# Patient Record
Sex: Female | Born: 1945 | Race: White | Hispanic: No | Marital: Married | Smoking: Never smoker
Health system: Southern US, Community
[De-identification: ages and names within clinical notes are randomized; demographics above are authoritative.]

## PROBLEM LIST (undated history)

## (undated) DIAGNOSIS — E6609 Other obesity due to excess calories: Secondary | ICD-10-CM

## (undated) DIAGNOSIS — H269 Unspecified cataract: Secondary | ICD-10-CM

## (undated) DIAGNOSIS — K219 Gastro-esophageal reflux disease without esophagitis: Secondary | ICD-10-CM

## (undated) DIAGNOSIS — M1990 Unspecified osteoarthritis, unspecified site: Secondary | ICD-10-CM

## (undated) DIAGNOSIS — I10 Essential (primary) hypertension: Secondary | ICD-10-CM

## (undated) DIAGNOSIS — M7989 Other specified soft tissue disorders: Secondary | ICD-10-CM

## (undated) DIAGNOSIS — R0602 Shortness of breath: Secondary | ICD-10-CM

## (undated) DIAGNOSIS — M5136 Other intervertebral disc degeneration, lumbar region: Secondary | ICD-10-CM

## (undated) DIAGNOSIS — M51369 Other intervertebral disc degeneration, lumbar region without mention of lumbar back pain or lower extremity pain: Secondary | ICD-10-CM

## (undated) DIAGNOSIS — R002 Palpitations: Secondary | ICD-10-CM

## (undated) DIAGNOSIS — E7800 Pure hypercholesterolemia, unspecified: Secondary | ICD-10-CM

## (undated) DIAGNOSIS — E781 Pure hyperglyceridemia: Secondary | ICD-10-CM

## (undated) DIAGNOSIS — E785 Hyperlipidemia, unspecified: Secondary | ICD-10-CM

## (undated) DIAGNOSIS — R12 Heartburn: Secondary | ICD-10-CM

## (undated) DIAGNOSIS — T7840XA Allergy, unspecified, initial encounter: Secondary | ICD-10-CM

## (undated) DIAGNOSIS — K5900 Constipation, unspecified: Secondary | ICD-10-CM

## (undated) DIAGNOSIS — R7303 Prediabetes: Secondary | ICD-10-CM

## (undated) HISTORY — DX: Pure hyperglyceridemia: E78.1

## (undated) HISTORY — DX: Gastro-esophageal reflux disease without esophagitis: K21.9

## (undated) HISTORY — DX: Unspecified osteoarthritis, unspecified site: M19.90

## (undated) HISTORY — DX: Constipation, unspecified: K59.00

## (undated) HISTORY — DX: Unspecified cataract: H26.9

## (undated) HISTORY — PX: TUBAL LIGATION: SHX77

## (undated) HISTORY — DX: Allergy, unspecified, initial encounter: T78.40XA

## (undated) HISTORY — DX: Other obesity due to excess calories: E66.09

## (undated) HISTORY — DX: Prediabetes: R73.03

## (undated) HISTORY — DX: Other intervertebral disc degeneration, lumbar region without mention of lumbar back pain or lower extremity pain: M51.369

## (undated) HISTORY — DX: Other intervertebral disc degeneration, lumbar region: M51.36

## (undated) HISTORY — DX: Essential (primary) hypertension: I10

## (undated) HISTORY — DX: Palpitations: R00.2

## (undated) HISTORY — PX: TONSILLECTOMY: SUR1361

## (undated) HISTORY — DX: Heartburn: R12

## (undated) HISTORY — PX: CATARACT EXTRACTION W/ INTRAOCULAR LENS IMPLANT: SHX1309

## (undated) HISTORY — PX: EYE SURGERY: SHX253

## (undated) HISTORY — DX: Other specified soft tissue disorders: M79.89

## (undated) HISTORY — DX: Hyperlipidemia, unspecified: E78.5

## (undated) HISTORY — DX: Shortness of breath: R06.02

## (undated) HISTORY — DX: Pure hypercholesterolemia, unspecified: E78.00

---

## 1998-11-19 ENCOUNTER — Encounter: Payer: Self-pay | Admitting: Cardiology

## 1998-11-19 ENCOUNTER — Encounter: Admission: RE | Admit: 1998-11-19 | Discharge: 1998-11-19 | Payer: Self-pay | Admitting: Cardiology

## 1999-01-02 ENCOUNTER — Other Ambulatory Visit: Admission: RE | Admit: 1999-01-02 | Discharge: 1999-01-02 | Payer: Self-pay | Admitting: *Deleted

## 1999-11-20 ENCOUNTER — Encounter: Admission: RE | Admit: 1999-11-20 | Discharge: 1999-11-20 | Payer: Self-pay | Admitting: *Deleted

## 2000-01-14 ENCOUNTER — Other Ambulatory Visit: Admission: RE | Admit: 2000-01-14 | Discharge: 2000-01-14 | Payer: Self-pay | Admitting: *Deleted

## 2000-03-17 ENCOUNTER — Ambulatory Visit (HOSPITAL_COMMUNITY): Admission: RE | Admit: 2000-03-17 | Discharge: 2000-03-17 | Payer: Self-pay | Admitting: Gastroenterology

## 2000-11-23 ENCOUNTER — Encounter: Admission: RE | Admit: 2000-11-23 | Discharge: 2000-11-23 | Payer: Self-pay | Admitting: *Deleted

## 2001-01-20 ENCOUNTER — Other Ambulatory Visit: Admission: RE | Admit: 2001-01-20 | Discharge: 2001-01-20 | Payer: Self-pay | Admitting: *Deleted

## 2001-11-24 ENCOUNTER — Encounter: Admission: RE | Admit: 2001-11-24 | Discharge: 2001-11-24 | Payer: Self-pay | Admitting: *Deleted

## 2002-01-25 ENCOUNTER — Other Ambulatory Visit: Admission: RE | Admit: 2002-01-25 | Discharge: 2002-01-25 | Payer: Self-pay | Admitting: *Deleted

## 2002-12-29 ENCOUNTER — Encounter: Admission: RE | Admit: 2002-12-29 | Discharge: 2002-12-29 | Payer: Self-pay | Admitting: *Deleted

## 2003-02-07 ENCOUNTER — Other Ambulatory Visit: Admission: RE | Admit: 2003-02-07 | Discharge: 2003-02-07 | Payer: Self-pay | Admitting: *Deleted

## 2004-01-11 ENCOUNTER — Encounter: Admission: RE | Admit: 2004-01-11 | Discharge: 2004-01-11 | Payer: Self-pay | Admitting: *Deleted

## 2005-02-10 ENCOUNTER — Encounter: Admission: RE | Admit: 2005-02-10 | Discharge: 2005-02-10 | Payer: Self-pay | Admitting: *Deleted

## 2006-02-12 ENCOUNTER — Encounter: Admission: RE | Admit: 2006-02-12 | Discharge: 2006-02-12 | Payer: Self-pay | Admitting: *Deleted

## 2007-02-15 ENCOUNTER — Encounter: Admission: RE | Admit: 2007-02-15 | Discharge: 2007-02-15 | Payer: Self-pay | Admitting: Obstetrics and Gynecology

## 2007-02-22 ENCOUNTER — Encounter: Admission: RE | Admit: 2007-02-22 | Discharge: 2007-02-22 | Payer: Self-pay | Admitting: Obstetrics and Gynecology

## 2007-03-23 ENCOUNTER — Encounter: Admission: RE | Admit: 2007-03-23 | Discharge: 2007-06-21 | Payer: Self-pay | Admitting: Cardiology

## 2007-08-24 ENCOUNTER — Encounter: Admission: RE | Admit: 2007-08-24 | Discharge: 2007-08-24 | Payer: Self-pay | Admitting: Cardiology

## 2008-02-29 ENCOUNTER — Encounter: Admission: RE | Admit: 2008-02-29 | Discharge: 2008-02-29 | Payer: Self-pay | Admitting: Obstetrics and Gynecology

## 2009-03-06 ENCOUNTER — Encounter: Admission: RE | Admit: 2009-03-06 | Discharge: 2009-03-06 | Payer: Self-pay | Admitting: Obstetrics and Gynecology

## 2009-09-16 ENCOUNTER — Ambulatory Visit: Payer: Self-pay | Admitting: Cardiology

## 2009-09-18 ENCOUNTER — Ambulatory Visit: Payer: Self-pay | Admitting: Cardiology

## 2010-02-16 ENCOUNTER — Encounter: Payer: Self-pay | Admitting: Obstetrics and Gynecology

## 2010-02-20 ENCOUNTER — Other Ambulatory Visit: Payer: Self-pay | Admitting: Obstetrics and Gynecology

## 2010-02-20 DIAGNOSIS — Z1239 Encounter for other screening for malignant neoplasm of breast: Secondary | ICD-10-CM

## 2010-03-12 ENCOUNTER — Ambulatory Visit
Admission: RE | Admit: 2010-03-12 | Discharge: 2010-03-12 | Disposition: A | Discharge status: Home / Self Care | Payer: BC Managed Care – PPO | Source: Ambulatory Visit | Attending: Obstetrics and Gynecology | Admitting: Obstetrics and Gynecology

## 2010-03-12 DIAGNOSIS — Z1239 Encounter for other screening for malignant neoplasm of breast: Secondary | ICD-10-CM

## 2010-03-20 ENCOUNTER — Other Ambulatory Visit (INDEPENDENT_AMBULATORY_CARE_PROVIDER_SITE_OTHER): Payer: BC Managed Care – PPO

## 2010-03-20 DIAGNOSIS — I119 Hypertensive heart disease without heart failure: Secondary | ICD-10-CM

## 2010-03-20 DIAGNOSIS — Z79899 Other long term (current) drug therapy: Secondary | ICD-10-CM

## 2010-03-20 DIAGNOSIS — E7800 Pure hypercholesterolemia, unspecified: Secondary | ICD-10-CM

## 2010-03-25 ENCOUNTER — Ambulatory Visit (INDEPENDENT_AMBULATORY_CARE_PROVIDER_SITE_OTHER): Payer: BC Managed Care – PPO | Admitting: Cardiology

## 2010-03-25 DIAGNOSIS — I119 Hypertensive heart disease without heart failure: Secondary | ICD-10-CM

## 2010-03-25 DIAGNOSIS — Z79899 Other long term (current) drug therapy: Secondary | ICD-10-CM

## 2010-03-25 DIAGNOSIS — E7800 Pure hypercholesterolemia, unspecified: Secondary | ICD-10-CM

## 2010-03-26 ENCOUNTER — Ambulatory Visit: Payer: Self-pay | Admitting: Cardiology

## 2010-03-26 ENCOUNTER — Other Ambulatory Visit: Payer: Self-pay | Admitting: Cardiology

## 2010-03-26 DIAGNOSIS — I10 Essential (primary) hypertension: Secondary | ICD-10-CM

## 2010-04-02 ENCOUNTER — Other Ambulatory Visit: Payer: BC Managed Care – PPO

## 2010-04-08 ENCOUNTER — Ambulatory Visit
Admission: RE | Admit: 2010-04-08 | Discharge: 2010-04-08 | Disposition: A | Discharge status: Home / Self Care | Payer: BC Managed Care – PPO | Source: Ambulatory Visit | Attending: Cardiology | Admitting: Cardiology

## 2010-04-08 DIAGNOSIS — I10 Essential (primary) hypertension: Secondary | ICD-10-CM

## 2010-06-13 NOTE — Procedures (Signed)
[place]. [facility]  Patient:    Lori Mooney, Lori Mooney                        MRN: [id number] Proc. Date: 03/17/00 Adm. Date:  [id number] Attending:  Rich Brave CC:         Pershing Cox, M.D.  Thomas A. Patty Sermons, M.D.   Procedure Report  PROCEDURE:  Colonoscopy.  INDICATIONS:  Screening for colon cancer in a 65 year old female.  FINDINGS:  Normal exam to the cecum.  DESCRIPTION OF PROCEDURE:  The nature, purpose, and risks of the procedure had been discussed with the patient, who provided written consent.  Sedation was fentanyl 100 mcg and Versed 10 mg IV without arrhythmias or desaturation.  Her blood pressure nadir was 88, which was just moderately below her baseline blood pressure, and for most of the rest of the procedure was in the 90s.  The Olympus adjustable-tension pediatric video colonoscope was advanced to the cecum with some looping overcome by having the patient in the supine position with external abdominal compression, then in the right lateral decubitus position, and finally back in the left lateral decubitus position.  It is felt that the patient might have an easier exam with the adult colonoscope on future procedures.  Pullback was then performed.  The cecum was identified by the appearance of the appendiceal orifice and the absence of further lumen.  The quality of the prep was excellent, and it is felt that all areas were well-seen.  This was a normal examination.  No polyps, cancer, colitis, vascular malformations, or diverticular disease were observed, and retroflexion in the rectum was normal.  There were some mild to moderate internal hemorrhoids seen during pull-out through the anal canal.  No biopsies were obtained.  The patient tolerated the procedure well.  There were no apparent complications.  IMPRESSION:  Normal screening colonoscopy.  PLAN:  Screening flexible sigmoidoscopy in five years with  consideration for full colonoscopy five years thereafter. DD:  03/17/00 TD:  03/17/00 Job: [id number]

## 2010-09-15 ENCOUNTER — Telehealth: Payer: Self-pay | Admitting: Cardiology

## 2010-09-15 DIAGNOSIS — Z139 Encounter for screening, unspecified: Secondary | ICD-10-CM

## 2010-09-15 NOTE — Telephone Encounter (Signed)
Left message

## 2010-09-15 NOTE — Telephone Encounter (Signed)
Chart in box 

## 2010-09-15 NOTE — Telephone Encounter (Signed)
Pt states would like to be seen earlier than November for her 25 M OV F/u.  Pt also has labs.  Please call pt back to see if you can get her in sooner for OV and Labs.  Pt will discuss with you she prefers Wed or Fri.

## 2010-09-16 ENCOUNTER — Encounter: Payer: Self-pay | Admitting: Cardiology

## 2010-09-17 ENCOUNTER — Encounter: Payer: Self-pay | Admitting: Cardiology

## 2010-10-03 ENCOUNTER — Other Ambulatory Visit (INDEPENDENT_AMBULATORY_CARE_PROVIDER_SITE_OTHER): Payer: BC Managed Care – PPO | Admitting: *Deleted

## 2010-10-03 DIAGNOSIS — Z139 Encounter for screening, unspecified: Secondary | ICD-10-CM

## 2010-10-03 LAB — LIPID PANEL
LDL Cholesterol: 98 mg/dL (ref 0–99)
VLDL: 31.4 mg/dL (ref 0.0–40.0)

## 2010-10-03 LAB — BASIC METABOLIC PANEL
BUN: 16 mg/dL (ref 6–23)
Creatinine, Ser: 0.9 mg/dL (ref 0.4–1.2)
Glucose, Bld: 93 mg/dL (ref 70–99)
Sodium: 141 mEq/L (ref 135–145)

## 2010-10-03 LAB — HEPATIC FUNCTION PANEL
Albumin: 4.3 g/dL (ref 3.5–5.2)
Alkaline Phosphatase: 71 U/L (ref 39–117)
Bilirubin, Direct: 0.1 mg/dL (ref 0.0–0.3)
Total Bilirubin: 0.5 mg/dL (ref 0.3–1.2)

## 2010-10-08 ENCOUNTER — Encounter: Payer: Self-pay | Admitting: Cardiology

## 2010-10-08 ENCOUNTER — Ambulatory Visit (INDEPENDENT_AMBULATORY_CARE_PROVIDER_SITE_OTHER): Payer: BC Managed Care – PPO | Admitting: Cardiology

## 2010-10-08 VITALS — BP 120/80 | HR 88 | Wt 187.0 lb

## 2010-10-08 DIAGNOSIS — E7800 Pure hypercholesterolemia, unspecified: Secondary | ICD-10-CM

## 2010-10-08 DIAGNOSIS — E669 Obesity, unspecified: Secondary | ICD-10-CM

## 2010-10-08 DIAGNOSIS — I119 Hypertensive heart disease without heart failure: Secondary | ICD-10-CM

## 2010-10-08 NOTE — Assessment & Plan Note (Signed)
Patient has not been expressing any chest pain or palpitations.  No dizziness or syncope.  No headaches.  She's not having side effects from the lisinopril HCT.  Her renal function remains normal.

## 2010-10-08 NOTE — Assessment & Plan Note (Signed)
The patient continues to have difficulty losing weight.  She has cut back on carbohydrates but probably not enough.  She also needs to increase her aerobic exercise further.  We talked about weight loss strategies today.
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
## 2010-10-08 NOTE — Assessment & Plan Note (Signed)
Patient has a long history of elevated cholesterol.  Presently she is on Lipitor 20 mg daily.  She's not having any myalgias or side effects from Lipitor.  We reviewed her recent blood work which was satisfactory.  Her triglycerides remain elevated secondary to her exogenous obesity.

## 2010-10-09 ENCOUNTER — Telehealth: Payer: Self-pay | Admitting: Cardiology

## 2010-10-09 NOTE — Telephone Encounter (Signed)
Called because she was concerned about the tumor Dr. 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 Please call back and feel free to leave a message or speak with her husband.

## 2010-12-04 ENCOUNTER — Telehealth: Payer: Self-pay | Admitting: Cardiology

## 2010-12-04 MED ORDER — ATORVASTATIN CALCIUM 20 MG PO TABS: 20.0000 mg | ORAL_TABLET | Freq: Every day | ORAL | Status: DC

## 2010-12-04 MED ORDER — LISINOPRIL-HYDROCHLOROTHIAZIDE 20-12.5 MG PO TABS: 1.0000 | ORAL_TABLET | Freq: Every day | ORAL | Status: DC

## 2010-12-04 NOTE — Telephone Encounter (Signed)
.   Requested Prescriptions   Signed Prescriptions Disp Refills  . atorvastatin (LIPITOR) 20 MG tablet 30 tablet 6    Sig: Take 1 tablet (20 mg total) by mouth daily.    Authorizing Provider: Cassell Clement    Ordering User: Lacie Scotts lisinopril-hydrochlorothiazide (PRINZIDE,ZESTORETIC) 20-12.5 MG per tablet 30 tablet 6    Sig: Take 1 tablet by mouth daily.    Authorizing Provider: Cassell Clement    Ordering User: Lacie Scotts

## 2010-12-04 NOTE — Telephone Encounter (Signed)
Pt states she called yesterday to get refill from [facility] and was not called in, pt going out of town tomorrow and will be out if she can't pick up refill in the morning before she leaves, atorvastatin 20 mg #90 and hctz 10mg /12.5 mg #90 , uses [facility]

## 2010-12-04 NOTE — Telephone Encounter (Signed)
Called into [facility]. Pt was called, no answer.

## 2011-02-16 ENCOUNTER — Other Ambulatory Visit: Payer: Self-pay | Admitting: Obstetrics & Gynecology

## 2011-02-16 DIAGNOSIS — Z1231 Encounter for screening mammogram for malignant neoplasm of breast: Secondary | ICD-10-CM

## 2011-02-24 DIAGNOSIS — D045 Carcinoma in situ of skin of trunk: Secondary | ICD-10-CM | POA: Diagnosis not present

## 2011-02-24 DIAGNOSIS — C44529 Squamous cell carcinoma of skin of other part of trunk: Secondary | ICD-10-CM | POA: Diagnosis not present

## 2011-02-24 DIAGNOSIS — C44621 Squamous cell carcinoma of skin of unspecified upper limb, including shoulder: Secondary | ICD-10-CM | POA: Diagnosis not present

## 2011-02-24 DIAGNOSIS — D239 Other benign neoplasm of skin, unspecified: Secondary | ICD-10-CM | POA: Diagnosis not present

## 2011-02-24 DIAGNOSIS — C44519 Basal cell carcinoma of skin of other part of trunk: Secondary | ICD-10-CM | POA: Diagnosis not present

## 2011-02-24 DIAGNOSIS — D0460 Carcinoma in situ of skin of unspecified upper limb, including shoulder: Secondary | ICD-10-CM | POA: Diagnosis not present

## 2011-02-24 DIAGNOSIS — C44721 Squamous cell carcinoma of skin of unspecified lower limb, including hip: Secondary | ICD-10-CM | POA: Diagnosis not present

## 2011-02-24 DIAGNOSIS — D0470 Carcinoma in situ of skin of unspecified lower limb, including hip: Secondary | ICD-10-CM | POA: Diagnosis not present

## 2011-02-24 DIAGNOSIS — L570 Actinic keratosis: Secondary | ICD-10-CM | POA: Diagnosis not present

## 2011-02-24 DIAGNOSIS — L821 Other seborrheic keratosis: Secondary | ICD-10-CM | POA: Diagnosis not present

## 2011-03-20 ENCOUNTER — Ambulatory Visit
Admission: RE | Admit: 2011-03-20 | Discharge: 2011-03-20 | Disposition: A | Discharge status: Home / Self Care | Payer: Medicare Other | Source: Ambulatory Visit | Attending: Obstetrics & Gynecology | Admitting: Obstetrics & Gynecology

## 2011-03-20 DIAGNOSIS — Z1231 Encounter for screening mammogram for malignant neoplasm of breast: Secondary | ICD-10-CM

## 2011-03-25 DIAGNOSIS — C44721 Squamous cell carcinoma of skin of unspecified lower limb, including hip: Secondary | ICD-10-CM | POA: Diagnosis not present

## 2011-03-25 DIAGNOSIS — L821 Other seborrheic keratosis: Secondary | ICD-10-CM | POA: Diagnosis not present

## 2011-03-25 DIAGNOSIS — L570 Actinic keratosis: Secondary | ICD-10-CM | POA: Diagnosis not present

## 2011-03-30 DIAGNOSIS — C44519 Basal cell carcinoma of skin of other part of trunk: Secondary | ICD-10-CM | POA: Diagnosis not present

## 2011-04-07 ENCOUNTER — Telehealth: Payer: Self-pay | Admitting: Cardiology

## 2011-04-07 DIAGNOSIS — R634 Abnormal weight loss: Secondary | ICD-10-CM

## 2011-04-07 NOTE — Telephone Encounter (Signed)
Discussed labs with patient.  She has had weight gain so will add thyroid panel to labs on Monday

## 2011-04-07 NOTE — Telephone Encounter (Signed)
New Msg: pt calling wanting to know if she is getting a complete panel done for pt lab work. Pt stated she hasn't had a complete panel done in a while and needs to get her sodium and everything checked. Please return pt call to discuss further.

## 2011-04-10 DIAGNOSIS — Z01419 Encounter for gynecological examination (general) (routine) without abnormal findings: Secondary | ICD-10-CM | POA: Diagnosis not present

## 2011-04-10 DIAGNOSIS — Z1389 Encounter for screening for other disorder: Secondary | ICD-10-CM | POA: Diagnosis not present

## 2011-04-10 DIAGNOSIS — Z124 Encounter for screening for malignant neoplasm of cervix: Secondary | ICD-10-CM | POA: Diagnosis not present

## 2011-04-13 ENCOUNTER — Other Ambulatory Visit (INDEPENDENT_AMBULATORY_CARE_PROVIDER_SITE_OTHER): Payer: Medicare Other

## 2011-04-13 ENCOUNTER — Other Ambulatory Visit: Payer: BC Managed Care – PPO

## 2011-04-13 DIAGNOSIS — I119 Hypertensive heart disease without heart failure: Secondary | ICD-10-CM

## 2011-04-13 DIAGNOSIS — E7800 Pure hypercholesterolemia, unspecified: Secondary | ICD-10-CM

## 2011-04-13 DIAGNOSIS — Z1212 Encounter for screening for malignant neoplasm of rectum: Secondary | ICD-10-CM | POA: Diagnosis not present

## 2011-04-13 DIAGNOSIS — R634 Abnormal weight loss: Secondary | ICD-10-CM | POA: Diagnosis not present

## 2011-04-13 LAB — T4, FREE: Free T4: 0.98 ng/dL (ref 0.60–1.60)

## 2011-04-13 LAB — LIPID PANEL
HDL: 63.5 mg/dL (ref >39.00)
LDL Cholesterol: 75 mg/dL (ref 0–99)
Total CHOL/HDL Ratio: 3
Triglycerides: 141 mg/dL (ref 0.0–149.0)

## 2011-04-13 LAB — HEPATIC FUNCTION PANEL
Alkaline Phosphatase: 57 U/L (ref 39–117)
Bilirubin, Direct: 0 mg/dL (ref 0.0–0.3)
Total Bilirubin: 0.6 mg/dL (ref 0.3–1.2)

## 2011-04-13 LAB — BASIC METABOLIC PANEL
Chloride: 104 mEq/L (ref 96–112)
GFR: 67.56 mL/min (ref >60.00)

## 2011-04-13 LAB — TSH: TSH: 1.45 u[IU]/mL (ref 0.35–5.50)

## 2011-04-13 NOTE — Progress Notes (Signed)
Quick Note:  Please make copy of labs for patient visit. ______ 

## 2011-04-17 ENCOUNTER — Encounter: Payer: Self-pay | Admitting: Cardiology

## 2011-04-17 ENCOUNTER — Ambulatory Visit (INDEPENDENT_AMBULATORY_CARE_PROVIDER_SITE_OTHER): Payer: Medicare Other | Admitting: Cardiology

## 2011-04-17 VITALS — BP 110/78 | HR 80 | Ht 62.0 in | Wt 190.0 lb

## 2011-04-17 DIAGNOSIS — E669 Obesity, unspecified: Secondary | ICD-10-CM | POA: Diagnosis not present

## 2011-04-17 DIAGNOSIS — E7800 Pure hypercholesterolemia, unspecified: Secondary | ICD-10-CM

## 2011-04-17 DIAGNOSIS — I119 Hypertensive heart disease without heart failure: Secondary | ICD-10-CM

## 2011-04-17 NOTE — Progress Notes (Signed)
Lori Mooney Date of Birth:  11/23/1945 [facility] [address] [place], [place]  [address] [phone]  Fax   [phone]  HPI: This pleasant 66 year old woman is seen for a scheduled followup office visit.  She has a history of hypertension and hypercholesterolemia.  Her problem with chronic exogenous obesity.  She is no longer participating in [employer].  Current Outpatient Prescriptions  Medication Sig Dispense Refill  . atorvastatin (LIPITOR) 20 MG tablet Take 1 tablet (20 mg total) by mouth daily.  30 tablet  6  . B Complex Vitamins (B COMPLEX PO) Take by mouth daily.        . Calcium Carbonate (CALCIUM 500 PO) Take by mouth 2 (two) times daily.        . Cholecalciferol (VITAMIN D PO) Take by mouth daily.        . Flaxseed, Linseed, (FLAX SEED OIL PO) Take by mouth daily.        [name] lisinopril-hydrochlorothiazide (PRINZIDE,ZESTORETIC) 20-12.5 MG per tablet Take 1 tablet by mouth daily.  30 tablet  6    No Known Allergies  Patient Active Problem List  Diagnoses  . Hypercholesterolemia  . Benign hypertensive heart disease without heart failure  . Obesity    History  Smoking status  . Never Smoker   Smokeless tobacco  . Not on file    History  Alcohol Use No    Family History  Problem Relation Age of Onset  . Hyperlipidemia Mother     Review of Systems: The patient denies any heat or cold intolerance.  No weight gain or weight loss.  The patient denies headaches or blurry vision.  There is no cough or sputum production.  The patient denies dizziness.  There is no hematuria or hematochezia.  The patient denies any muscle aches or arthritis.  The patient denies any rash.  The patient denies frequent falling or instability.  There is no history of depression or anxiety.  All other systems were reviewed and are negative.   Physical Exam: Filed Vitals:   04/17/11 1142  BP: 110/78  Pulse: 80   the general appearance reveals a  well-developed well-nourished middle-aged woman in no distress.The head and neck exam reveals pupils equal and reactive.  Extraocular movements are full.  There is no scleral icterus.  The mouth and pharynx are normal.  The neck is supple.  The carotids reveal no bruits.  The jugular venous pressure is normal.  The  thyroid is not enlarged.  There is no lymphadenopathy.  The chest is clear to percussion and auscultation.  There are no rales or rhonchi.  Expansion of the chest is symmetrical.  The precordium is quiet.  The first heart sound is normal.  The second heart sound is physiologically split.  There is no murmur gallop rub or click.  There is no abnormal lift or heave.  The abdomen is soft and nontender.  The bowel sounds are normal.  The liver and spleen are not enlarged.  There are no abdominal masses.  There are no abdominal bruits.  Extremities reveal good pedal pulses.  There is no phlebitis or edema.  Her left ankle is larger than her right secondary to a remote  There is no cyanosis or clubbing.  Strength is normal and symmetrical in all extremities.  There is no lateralizing weakness.  There are no sensory deficits.  The skin is warm and dry.  There is no rash.  Assessment / Plan: Continue same medication and work harder on low carbohydrate weight reduction diet.  Recheck in 6 months for followup office visit and fasting lab work

## 2011-04-17 NOTE — Assessment & Plan Note (Signed)
Her weight is up 3 pounds over the winter.  She is now on a [employer] rather than [employer].

## 2011-04-17 NOTE — Patient Instructions (Signed)
Your physician recommends that you continue on your current medications as directed. Please refer to the Current Medication list given to you today.  Your physician wants you to follow-up in: 6 months You will receive a reminder letter in the mail two months in advance. If you don't receive a letter, please call our office to schedule the follow-up appointment.  Continue strict diet

## 2011-04-17 NOTE — Assessment & Plan Note (Signed)
She remains on Lipitor.  She's not having any side effects from the statin therapy.  Her lipids are slightly better at this time even though her weight is up 3 pounds

## 2011-04-17 NOTE — Assessment & Plan Note (Signed)
The patient denies any chest pain or shortness of breath.  She is going to a low impact aerobic class at the [facility] 3 times a week for exercise.

## 2011-05-29 ENCOUNTER — Encounter: Payer: Self-pay | Admitting: Cardiology

## 2011-05-29 DIAGNOSIS — Z86010 Personal history of colonic polyps: Secondary | ICD-10-CM | POA: Diagnosis not present

## 2011-05-29 DIAGNOSIS — R198 Other specified symptoms and signs involving the digestive system and abdomen: Secondary | ICD-10-CM | POA: Diagnosis not present

## 2011-05-29 DIAGNOSIS — Z09 Encounter for follow-up examination after completed treatment for conditions other than malignant neoplasm: Secondary | ICD-10-CM | POA: Diagnosis not present

## 2011-10-30 DIAGNOSIS — L909 Atrophic disorder of skin, unspecified: Secondary | ICD-10-CM | POA: Diagnosis not present

## 2011-10-30 DIAGNOSIS — L919 Hypertrophic disorder of the skin, unspecified: Secondary | ICD-10-CM | POA: Diagnosis not present

## 2011-10-30 DIAGNOSIS — L821 Other seborrheic keratosis: Secondary | ICD-10-CM | POA: Diagnosis not present

## 2011-10-30 DIAGNOSIS — Z85828 Personal history of other malignant neoplasm of skin: Secondary | ICD-10-CM | POA: Diagnosis not present

## 2011-11-07 DIAGNOSIS — Z23 Encounter for immunization: Secondary | ICD-10-CM | POA: Diagnosis not present

## 2011-11-13 DIAGNOSIS — H43399 Other vitreous opacities, unspecified eye: Secondary | ICD-10-CM | POA: Diagnosis not present

## 2011-11-13 DIAGNOSIS — H2510 Age-related nuclear cataract, unspecified eye: Secondary | ICD-10-CM | POA: Diagnosis not present

## 2011-11-13 DIAGNOSIS — Z961 Presence of intraocular lens: Secondary | ICD-10-CM | POA: Diagnosis not present

## 2011-12-10 ENCOUNTER — Telehealth: Payer: Self-pay | Admitting: Cardiology

## 2011-12-10 NOTE — Telephone Encounter (Signed)
Left message to call back  

## 2011-12-10 NOTE — Telephone Encounter (Signed)
New problem:  Test results.  

## 2011-12-14 DIAGNOSIS — R300 Dysuria: Secondary | ICD-10-CM | POA: Diagnosis not present

## 2011-12-14 DIAGNOSIS — R350 Frequency of micturition: Secondary | ICD-10-CM | POA: Diagnosis not present

## 2011-12-18 NOTE — Telephone Encounter (Signed)
Left another message to call if she needed something, not sure what she was calling about

## 2012-02-03 ENCOUNTER — Ambulatory Visit: Payer: Self-pay | Admitting: Cardiology

## 2012-02-22 ENCOUNTER — Other Ambulatory Visit: Payer: Self-pay | Admitting: *Deleted

## 2012-02-22 DIAGNOSIS — I119 Hypertensive heart disease without heart failure: Secondary | ICD-10-CM

## 2012-02-22 DIAGNOSIS — E7800 Pure hypercholesterolemia, unspecified: Secondary | ICD-10-CM

## 2012-02-24 ENCOUNTER — Other Ambulatory Visit (INDEPENDENT_AMBULATORY_CARE_PROVIDER_SITE_OTHER): Payer: Medicare Other

## 2012-02-24 DIAGNOSIS — E7800 Pure hypercholesterolemia, unspecified: Secondary | ICD-10-CM | POA: Diagnosis not present

## 2012-02-24 DIAGNOSIS — I119 Hypertensive heart disease without heart failure: Secondary | ICD-10-CM

## 2012-02-24 LAB — HEPATIC FUNCTION PANEL
ALT: 23 U/L (ref 0–35)
AST: 21 U/L (ref 0–37)
Albumin: 4.1 g/dL (ref 3.5–5.2)
Alkaline Phosphatase: 55 U/L (ref 39–117)
Total Bilirubin: 0.7 mg/dL (ref 0.3–1.2)

## 2012-02-24 LAB — LIPID PANEL
Cholesterol: 166 mg/dL (ref 0–200)
HDL: 55.2 mg/dL (ref >39.00)
Triglycerides: 139 mg/dL (ref 0.0–149.0)

## 2012-02-24 LAB — BASIC METABOLIC PANEL
Calcium: 9.6 mg/dL (ref 8.4–10.5)
Creatinine, Ser: 0.9 mg/dL (ref 0.4–1.2)
GFR: 64.05 mL/min (ref >60.00)
Sodium: 138 mEq/L (ref 135–145)

## 2012-02-24 NOTE — Progress Notes (Signed)
Quick Note:  Please make copy of labs for patient visit. ______ 

## 2012-03-02 ENCOUNTER — Encounter: Payer: Self-pay | Admitting: Cardiology

## 2012-03-02 ENCOUNTER — Other Ambulatory Visit: Payer: Self-pay | Admitting: Obstetrics & Gynecology

## 2012-03-02 ENCOUNTER — Ambulatory Visit (INDEPENDENT_AMBULATORY_CARE_PROVIDER_SITE_OTHER): Payer: Medicare Other | Admitting: Cardiology

## 2012-03-02 VITALS — BP 118/88 | HR 89 | Ht 63.0 in | Wt 195.0 lb

## 2012-03-02 DIAGNOSIS — Z1231 Encounter for screening mammogram for malignant neoplasm of breast: Secondary | ICD-10-CM

## 2012-03-02 DIAGNOSIS — I119 Hypertensive heart disease without heart failure: Secondary | ICD-10-CM

## 2012-03-02 DIAGNOSIS — E7800 Pure hypercholesterolemia, unspecified: Secondary | ICD-10-CM | POA: Diagnosis not present

## 2012-03-02 DIAGNOSIS — N390 Urinary tract infection, site not specified: Secondary | ICD-10-CM | POA: Diagnosis not present

## 2012-03-02 DIAGNOSIS — R42 Dizziness and giddiness: Secondary | ICD-10-CM | POA: Diagnosis not present

## 2012-03-02 LAB — CBC WITH DIFFERENTIAL/PLATELET
Basophils Absolute: 0 10*3/uL (ref 0.0–0.1)
Eosinophils Relative: 2.7 % (ref 0.0–5.0)
HCT: 43.5 % (ref 36.0–46.0)
Lymphs Abs: 2.5 10*3/uL (ref 0.7–4.0)
MCV: 86.8 fl (ref 78.0–100.0)
Monocytes Absolute: 0.4 10*3/uL (ref 0.1–1.0)
Neutrophils Relative %: 52.2 % (ref 43.0–77.0)
Platelets: 313 10*3/uL (ref 150.0–400.0)
RDW: 13.4 % (ref 11.5–14.6)
WBC: 6.7 10*3/uL (ref 4.5–10.5)

## 2012-03-02 MED ORDER — ATORVASTATIN CALCIUM 20 MG PO TABS: 20.0000 mg | ORAL_TABLET | Freq: Every day | ORAL | Status: DC

## 2012-03-02 NOTE — Patient Instructions (Addendum)
Will obtain labs today and call you with the results (cbc/urinalysis)  Try OTC Bonine  (meclizine) 25 mg ever 6 hours as needed for dizziness and OTC Mucinex plain 600 mg every 12 hours as needed for cough/congestion.   Your physician wants you to follow-up in: 6 months with fasting labs (lp/bmet/hfp)  You will receive a reminder letter in the mail two months in advance. If you don't receive a letter, please call our office to schedule the follow-up appointment.

## 2012-03-02 NOTE — Progress Notes (Signed)
Lori Mooney Date of Birth:  10-24-1945 [facility] [address] [place], [place]  [address] [phone]  Fax   [phone]  HPI: This pleasant 67 year old woman is seen for a scheduled followup office visit. She has a history of hypertension and hypercholesterolemia.  She has a problem with chronic exogenous obesity. She is no longer participating in [employer].  Her weight is up 5 pounds since last visit.   Current Outpatient Prescriptions  Medication Sig Dispense Refill  . atorvastatin (LIPITOR) 20 MG tablet Take 1 tablet (20 mg total) by mouth daily.  90 tablet  3  . B Complex Vitamins (B COMPLEX PO) Take by mouth daily.        . Calcium Carbonate (CALCIUM 500 PO) Take by mouth 2 (two) times daily.        . Cholecalciferol (VITAMIN D PO) Take by mouth daily.        . Flaxseed, Linseed, (FLAX SEED OIL PO) Take by mouth daily.        [name] guaiFENesin (MUCINEX) 600 MG 12 hr tablet Take 600 mg by mouth 2 (two) times daily as needed.      [name] lisinopril-hydrochlorothiazide (PRINZIDE,ZESTORETIC) 20-12.5 MG per tablet Take 1 tablet by mouth daily.  30 tablet  6  . meclizine (ANTIVERT) 25 MG tablet Take 25 mg by mouth every 6 (six) hours as needed.        No Known Allergies  Patient Active Problem List  Diagnosis  . Hypercholesterolemia  . Benign hypertensive heart disease without heart failure  . Obesity    History  Smoking status  . Never Smoker   Smokeless tobacco  . Not on file    History  Alcohol Use No    Family History  Problem Relation Age of Onset  . Hyperlipidemia Mother     Review of Systems: The patient denies any heat or cold intolerance.  No weight gain or weight loss.  The patient denies headaches or blurry vision.  There is no cough or sputum production.  The patient denies dizziness.  There is no hematuria or hematochezia.  The patient denies any muscle aches or arthritis.  The patient denies any rash.  The patient denies  frequent falling or instability.  There is no history of depression or anxiety.  All other systems were reviewed and are negative.   Physical Exam: Filed Vitals:   03/02/12 1017  BP: 118/88  Pulse: 89   the general appearance reveals a overweight woman in no acute distress.The head and neck exam reveals pupils equal and reactive.  Extraocular movements are full.  There is no scleral icterus.  The mouth and pharynx are normal.  The neck is supple.  The carotids reveal no bruits.  The jugular venous pressure is normal.  The  thyroid is not enlarged.  There is no lymphadenopathy.  The chest is clear to percussion and auscultation.  There are no rales or rhonchi.  Expansion of the chest is symmetrical.  The precordium is quiet.  The first heart sound is normal.  The second heart sound is physiologically split.  There is no murmur gallop rub or click.  There is no abnormal lift or heave.  The abdomen is soft and nontender.  The bowel sounds are normal.  The liver and spleen are not enlarged.  There are no abdominal masses.  There are no abdominal bruits.  Extremities reveal good pedal pulses.  There is no phlebitis or edema.  There  is no cyanosis or clubbing.  Strength is normal and symmetrical in all extremities.  There is no lateralizing weakness.  There are no sensory deficits.  The skin is warm and dry.  There is no rash.      Assessment / Plan: We talked about the importance of weight loss.  We talked about adhering strictly to a low carbohydrate low starch low sugar diet.  She states that she has found that weight loss program such as Weight Watchers do not work for her.  She needs to exercise on a more regular basis also.  Continue same medication and be rechecked in 6 months for followup office visit lipid panel hepatic function panel and basal metabolic panel.

## 2012-03-02 NOTE — Assessment & Plan Note (Signed)
The patient has had some recent symptoms that make her think that she may have a quiescent urinary tract infection.  No fever or chills.  We talked about how UTIs may present with subtle symptoms in women.  We will get a urinalysis and culture on her.

## 2012-03-02 NOTE — Assessment & Plan Note (Signed)
The patient denies any chest pain or increased shortness of breath.  She has not had any syncopal episodes.  He has had some dizziness related to vertigo for the past 2 days and she will use some over-the-counter meclizine as needed.

## 2012-03-02 NOTE — Progress Notes (Signed)
Quick Note:  Please report to patient. The recent labs are stable. Continue same medication and careful diet. WBC is normal. ______

## 2012-03-02 NOTE — Assessment & Plan Note (Signed)
Patient having symptoms of mild vertigo for the past 2 days.  Examination of her ears shows normal tympanic membranes and no evidence of a middle ear infection.  She will use over-the-counter meclizine.

## 2012-03-02 NOTE — Assessment & Plan Note (Signed)
The patient is on Lipitor 20 mg daily.  Is not having myalgias or side effects.

## 2012-03-03 LAB — URINALYSIS W MICROSCOPIC + REFLEX CULTURE
Glucose, UA: NEGATIVE mg/dL
Hgb urine dipstick: NEGATIVE
Leukocytes, UA: NEGATIVE
Nitrite: NEGATIVE
Protein, ur: NEGATIVE mg/dL
Squamous Epithelial / LPF: NONE SEEN
Urobilinogen, UA: 0.2 mg/dL (ref 0.0–1.0)

## 2012-03-04 ENCOUNTER — Telehealth: Payer: Self-pay | Admitting: Cardiology

## 2012-03-04 NOTE — Telephone Encounter (Signed)
Pt rtn call to cheryl from yesterday

## 2012-03-04 NOTE — Telephone Encounter (Signed)
Patient called lab and urinalysis results given.

## 2012-03-30 ENCOUNTER — Ambulatory Visit
Admission: RE | Admit: 2012-03-30 | Discharge: 2012-03-30 | Disposition: A | Discharge status: Home / Self Care | Payer: Medicare Other | Source: Ambulatory Visit | Attending: Obstetrics & Gynecology | Admitting: Obstetrics & Gynecology

## 2012-03-30 DIAGNOSIS — Z1231 Encounter for screening mammogram for malignant neoplasm of breast: Secondary | ICD-10-CM

## 2012-05-04 DIAGNOSIS — L819 Disorder of pigmentation, unspecified: Secondary | ICD-10-CM | POA: Diagnosis not present

## 2012-05-04 DIAGNOSIS — L820 Inflamed seborrheic keratosis: Secondary | ICD-10-CM | POA: Diagnosis not present

## 2012-05-04 DIAGNOSIS — D044 Carcinoma in situ of skin of scalp and neck: Secondary | ICD-10-CM | POA: Diagnosis not present

## 2012-05-04 DIAGNOSIS — Z85828 Personal history of other malignant neoplasm of skin: Secondary | ICD-10-CM | POA: Diagnosis not present

## 2012-07-22 DIAGNOSIS — N950 Postmenopausal bleeding: Secondary | ICD-10-CM | POA: Diagnosis not present

## 2012-07-22 DIAGNOSIS — N840 Polyp of corpus uteri: Secondary | ICD-10-CM | POA: Diagnosis not present

## 2012-07-22 DIAGNOSIS — N859 Noninflammatory disorder of uterus, unspecified: Secondary | ICD-10-CM | POA: Diagnosis not present

## 2012-07-28 DIAGNOSIS — N840 Polyp of corpus uteri: Secondary | ICD-10-CM | POA: Diagnosis not present

## 2012-07-28 DIAGNOSIS — N950 Postmenopausal bleeding: Secondary | ICD-10-CM | POA: Diagnosis not present

## 2012-08-08 ENCOUNTER — Other Ambulatory Visit: Payer: Self-pay | Admitting: Obstetrics & Gynecology

## 2012-08-23 ENCOUNTER — Encounter (HOSPITAL_COMMUNITY)
Admission: RE | Admit: 2012-08-23 | Discharge: 2012-08-23 | Disposition: A | Discharge status: Home / Self Care | Payer: Medicare Other | Source: Ambulatory Visit | Attending: Obstetrics & Gynecology | Admitting: Obstetrics & Gynecology

## 2012-08-23 ENCOUNTER — Other Ambulatory Visit: Payer: Self-pay

## 2012-08-23 ENCOUNTER — Encounter (HOSPITAL_COMMUNITY): Payer: Self-pay

## 2012-08-23 DIAGNOSIS — N840 Polyp of corpus uteri: Secondary | ICD-10-CM | POA: Diagnosis not present

## 2012-08-23 DIAGNOSIS — K219 Gastro-esophageal reflux disease without esophagitis: Secondary | ICD-10-CM | POA: Diagnosis not present

## 2012-08-23 DIAGNOSIS — E669 Obesity, unspecified: Secondary | ICD-10-CM | POA: Diagnosis not present

## 2012-08-23 DIAGNOSIS — E7800 Pure hypercholesterolemia, unspecified: Secondary | ICD-10-CM | POA: Diagnosis not present

## 2012-08-23 DIAGNOSIS — I10 Essential (primary) hypertension: Secondary | ICD-10-CM | POA: Diagnosis not present

## 2012-08-23 DIAGNOSIS — Z79899 Other long term (current) drug therapy: Secondary | ICD-10-CM | POA: Diagnosis not present

## 2012-08-23 DIAGNOSIS — N854 Malposition of uterus: Secondary | ICD-10-CM | POA: Diagnosis not present

## 2012-08-23 DIAGNOSIS — N950 Postmenopausal bleeding: Secondary | ICD-10-CM | POA: Diagnosis not present

## 2012-08-23 LAB — COMPREHENSIVE METABOLIC PANEL
ALT: 24 U/L (ref 0–35)
AST: 21 U/L (ref 0–37)
Calcium: 9.8 mg/dL (ref 8.4–10.5)
Sodium: 138 mEq/L (ref 135–145)
Total Protein: 7 g/dL (ref 6.0–8.3)

## 2012-08-23 LAB — CBC
MCH: 29.7 pg (ref 26.0–34.0)
MCHC: 34.3 g/dL (ref 30.0–36.0)
Platelets: 280 10*3/uL (ref 150–400)
RBC: 5.11 MIL/uL (ref 3.87–5.11)

## 2012-08-23 NOTE — Patient Instructions (Addendum)
20 Lori Mooney  08/23/2012   Your procedure is scheduled on:  08/25/12  Enter through the Main Entrance of [facility] at 1130 AM.  Pick up the phone at the desk and dial [phone].   Call this number if you have problems the morning of surgery: [phone]   Remember:   Do not eat food:After Midnight.  Do not drink clear liquids: 4 Hours before arrival.  Take these medicines the morning of surgery with A SIP OF WATER: Blood pressure medication and  Zantac   Do not wear jewelry, make-up or nail polish.  Do not wear lotions, powders, or perfumes. You may wear deodorant.  Do not shave 48 hours prior to surgery.  Do not bring valuables to the hospital.  [facility] is not responsible                  for any belongings or valuables brought to the hospital.  Contacts, dentures or bridgework may not be worn into surgery.  Leave suitcase in the car. After surgery it may be brought to your room.  For patients admitted to the hospital, checkout time is 11:00 AM the day of                discharge.   Patients discharged the day of surgery will not be allowed to drive                   home.  Name and phone number of your driver: Husband  Special Instructions: Shower using CHG 2 nights before surgery and the night before surgery.  If you shower the day of surgery use CHG.  Use special wash - you have one bottle of CHG for all showers.  You should use approximately 1/3 of the bottle for each shower.   Please read over the following fact sheets that you were given: Surgical Site Infection Prevention

## 2012-08-25 ENCOUNTER — Encounter (HOSPITAL_COMMUNITY): Payer: Self-pay | Admitting: Anesthesiology

## 2012-08-25 ENCOUNTER — Encounter (HOSPITAL_COMMUNITY)
Admission: RE | Disposition: A | Discharge status: Home / Self Care | Payer: Self-pay | Source: Ambulatory Visit | Attending: Obstetrics & Gynecology

## 2012-08-25 ENCOUNTER — Ambulatory Visit (HOSPITAL_COMMUNITY): Payer: Medicare Other | Admitting: Anesthesiology

## 2012-08-25 ENCOUNTER — Ambulatory Visit (HOSPITAL_COMMUNITY)
Admission: RE | Admit: 2012-08-25 | Discharge: 2012-08-25 | Disposition: A | Discharge status: Home / Self Care | Payer: Medicare Other | Source: Ambulatory Visit | Attending: Obstetrics & Gynecology | Admitting: Obstetrics & Gynecology

## 2012-08-25 DIAGNOSIS — N950 Postmenopausal bleeding: Secondary | ICD-10-CM | POA: Insufficient documentation

## 2012-08-25 DIAGNOSIS — E669 Obesity, unspecified: Secondary | ICD-10-CM | POA: Insufficient documentation

## 2012-08-25 DIAGNOSIS — E7800 Pure hypercholesterolemia, unspecified: Secondary | ICD-10-CM | POA: Diagnosis not present

## 2012-08-25 DIAGNOSIS — I10 Essential (primary) hypertension: Secondary | ICD-10-CM | POA: Insufficient documentation

## 2012-08-25 DIAGNOSIS — N840 Polyp of corpus uteri: Secondary | ICD-10-CM | POA: Insufficient documentation

## 2012-08-25 DIAGNOSIS — Z79899 Other long term (current) drug therapy: Secondary | ICD-10-CM | POA: Insufficient documentation

## 2012-08-25 DIAGNOSIS — N854 Malposition of uterus: Secondary | ICD-10-CM | POA: Insufficient documentation

## 2012-08-25 DIAGNOSIS — K219 Gastro-esophageal reflux disease without esophagitis: Secondary | ICD-10-CM | POA: Insufficient documentation

## 2012-08-25 DIAGNOSIS — D250 Submucous leiomyoma of uterus: Secondary | ICD-10-CM | POA: Diagnosis not present

## 2012-08-25 HISTORY — PX: DILITATION & CURRETTAGE/HYSTROSCOPY WITH VERSAPOINT RESECTION: SHX5571

## 2012-08-25 SURGERY — DILATATION & CURETTAGE/HYSTEROSCOPY WITH VERSAPOINT RESECTION
Anesthesia: General | Site: Vagina | Wound class: Clean Contaminated

## 2012-08-25 MED ORDER — DEXAMETHASONE SODIUM PHOSPHATE 10 MG/ML IJ SOLN
INTRAMUSCULAR | Status: AC
  Filled 2012-08-25: qty 1

## 2012-08-25 MED ORDER — FENTANYL CITRATE 0.05 MG/ML IJ SOLN
INTRAMUSCULAR | Status: DC | PRN
  Administered 2012-08-25 (×2): 25 ug via INTRAVENOUS
  Administered 2012-08-25: 50 ug via INTRAVENOUS

## 2012-08-25 MED ORDER — EPHEDRINE SULFATE 50 MG/ML IJ SOLN
INTRAMUSCULAR | Status: DC | PRN
  Administered 2012-08-25: 15 mg via INTRAVENOUS

## 2012-08-25 MED ORDER — LACTATED RINGERS IV SOLN
INTRAVENOUS | Status: DC
  Administered 2012-08-25 (×2): via INTRAVENOUS

## 2012-08-25 MED ORDER — EPHEDRINE 5 MG/ML INJ
INTRAVENOUS | Status: AC
  Filled 2012-08-25: qty 10

## 2012-08-25 MED ORDER — FENTANYL CITRATE 0.05 MG/ML IJ SOLN: 25.0000 ug | INTRAMUSCULAR | Status: DC | PRN

## 2012-08-25 MED ORDER — LIDOCAINE HCL (CARDIAC) 20 MG/ML IV SOLN
INTRAVENOUS | Status: DC | PRN
  Administered 2012-08-25: 100 mg via INTRAVENOUS

## 2012-08-25 MED ORDER — LIDOCAINE HCL (CARDIAC) 20 MG/ML IV SOLN
INTRAVENOUS | Status: AC
  Filled 2012-08-25: qty 5

## 2012-08-25 MED ORDER — DEXAMETHASONE SODIUM PHOSPHATE 10 MG/ML IJ SOLN
INTRAMUSCULAR | Status: DC | PRN
  Administered 2012-08-25: 10 mg via INTRAVENOUS

## 2012-08-25 MED ORDER — SODIUM CHLORIDE 0.9 % IR SOLN
Status: DC | PRN
  Administered 2012-08-25: 3000 mL

## 2012-08-25 MED ORDER — CEFAZOLIN SODIUM-DEXTROSE 2-3 GM-% IV SOLR
INTRAVENOUS | Status: AC
  Filled 2012-08-25: qty 50

## 2012-08-25 MED ORDER — ONDANSETRON HCL 4 MG/2ML IJ SOLN
INTRAMUSCULAR | Status: AC
  Filled 2012-08-25: qty 2

## 2012-08-25 MED ORDER — PROPOFOL 10 MG/ML IV BOLUS
INTRAVENOUS | Status: DC | PRN
  Administered 2012-08-25: 200 mg via INTRAVENOUS

## 2012-08-25 MED ORDER — CHLOROPROCAINE HCL 1 % IJ SOLN
INTRAMUSCULAR | Status: AC
  Filled 2012-08-25: qty 30

## 2012-08-25 MED ORDER — MEPERIDINE HCL 25 MG/ML IJ SOLN: 6.2500 mg | INTRAMUSCULAR | Status: DC | PRN

## 2012-08-25 MED ORDER — FENTANYL CITRATE 0.05 MG/ML IJ SOLN
INTRAMUSCULAR | Status: AC
  Filled 2012-08-25: qty 2

## 2012-08-25 MED ORDER — CEFAZOLIN SODIUM-DEXTROSE 2-3 GM-% IV SOLR
2.0000 g | INTRAVENOUS | Status: AC
  Administered 2012-08-25: 2 g via INTRAVENOUS

## 2012-08-25 MED ORDER — ONDANSETRON HCL 4 MG/2ML IJ SOLN
INTRAMUSCULAR | Status: DC | PRN
  Administered 2012-08-25: 4 mg via INTRAVENOUS

## 2012-08-25 MED ORDER — MIDAZOLAM HCL 2 MG/2ML IJ SOLN
INTRAMUSCULAR | Status: AC
  Filled 2012-08-25: qty 2

## 2012-08-25 MED ORDER — CHLOROPROCAINE HCL 1 % IJ SOLN
INTRAMUSCULAR | Status: DC | PRN
  Administered 2012-08-25: 20 mL

## 2012-08-25 MED ORDER — KETOROLAC TROMETHAMINE 30 MG/ML IJ SOLN
INTRAMUSCULAR | Status: AC
  Filled 2012-08-25: qty 1

## 2012-08-25 MED ORDER — METOCLOPRAMIDE HCL 5 MG/ML IJ SOLN: 10.0000 mg | Freq: Once | INTRAMUSCULAR | Status: DC | PRN

## 2012-08-25 MED ORDER — MIDAZOLAM HCL 5 MG/5ML IJ SOLN
INTRAMUSCULAR | Status: DC | PRN
  Administered 2012-08-25: 2 mg via INTRAVENOUS

## 2012-08-25 MED ORDER — KETOROLAC TROMETHAMINE 30 MG/ML IJ SOLN
INTRAMUSCULAR | Status: DC | PRN
  Administered 2012-08-25: 30 mg via INTRAVENOUS

## 2012-08-25 MED ORDER — PROPOFOL 10 MG/ML IV EMUL
INTRAVENOUS | Status: AC
  Filled 2012-08-25: qty 20

## 2012-08-25 SURGICAL SUPPLY — 14 items
CANISTER SUCTION 2500CC (MISCELLANEOUS) ×2 IMPLANT
CATH ROBINSON RED A/P 16FR (CATHETERS) ×2 IMPLANT
CLOTH BEACON ORANGE TIMEOUT ST (SAFETY) ×2 IMPLANT
CONTAINER PREFILL 10% NBF 60ML (FORM) ×4 IMPLANT
DRESSING TELFA 8X3 (GAUZE/BANDAGES/DRESSINGS) ×2 IMPLANT
ELECTRODE RT ANGLE VERSAPOINT (CUTTING LOOP) ×1 IMPLANT
GLOVE BIO SURGEON STRL SZ 6.5 (GLOVE) ×2 IMPLANT
GLOVE BIOGEL PI IND STRL 7.0 (GLOVE) ×2 IMPLANT
GLOVE BIOGEL PI INDICATOR 7.0 (GLOVE) ×2
GOWN STRL REIN XL XLG (GOWN DISPOSABLE) ×2 IMPLANT
PACK HYSTEROSCOPY LF (CUSTOM PROCEDURE TRAY) ×2 IMPLANT
PAD OB MATERNITY 4.3X12.25 (PERSONAL CARE ITEMS) ×2 IMPLANT
TOWEL OR 17X24 6PK STRL BLUE (TOWEL DISPOSABLE) ×4 IMPLANT
WATER STERILE IRR 1000ML POUR (IV SOLUTION) ×2 IMPLANT

## 2012-08-25 NOTE — Transfer of Care (Signed)
Immediate Anesthesia Transfer of Care Note  Patient: Lori Mooney  Procedure(s) Performed: Procedure(s): DILATATION & CURETTAGE/HYSTEROSCOPY WITH VERSAPOINT RESECTION (N/A)  Patient Location: PACU  Anesthesia Type:General  Level of Consciousness: awake, alert  and oriented  Airway & Oxygen Therapy: Patient Spontanous Breathing and Patient connected to nasal cannula oxygen  Post-op Assessment: Report given to PACU RN  Post vital signs: Reviewed  Complications: No apparent anesthesia complications

## 2012-08-25 NOTE — Preoperative (Signed)
Beta Blockers   Reason not to administer Beta Blockers:Not Applicable 

## 2012-08-25 NOTE — Op Note (Signed)
08/25/2012  1:54 PM  PATIENT:  Lori Mooney  66 y.o. 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 She is prepped with Hibiclens on the suprapubic, vulvar and vaginal areas. The bladder is catheterized. The patient is draped as usual. The vaginal exam reveals an anteverted uterus, normal volume, no adnexal mass. The speculum is inserted in the vagina.  The anterior lip of the cervix is grasped with a tenaculum.  A paracervical block is done with Nesacaine 1% a total of 20 cc at 4 and 8:00.  The hysterometry is 8 cm. Dilation of the cervix with Hegar dilators up to #31 without difficulty. We inserted the operative hysteroscope with the versa point loupe. Inspection of the intrauterine cavity revealing a large posterior mass of about 3 cm in diameter.  It's contour was slightly irregular with mild vascularity.  Pictures are taken. Both ostia all well seen. The rest of the cavity is normal in appearance.  Resection of the mass with the versa point loupe. 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  The lesion is completely resected. All pieces are removed and sent to pathology. Pictures are taken after resection. Hemostasis is adequate at all levels. We therefore removed the hysteroscope with the versa point. We proceed with the systematic curettage of the intrauterine cavity on all surfaces with a sharp curet. The cavity appears atrophic and very little tissue is removed. That specimen is sent together with the resection specimen.  The tenaculum was removed from the anterior lip of the  cervix. Hemostasis is adequate at that level. The speculum is removed. The patient is brought to recovery room in good and stable status.  ESTIMATED BLOOD LOSS:  5 cc   Intake/Output Summary (Last 24 hours) at 08/25/12 1354 Last data filed at 08/25/12 1342  Gross per 24 hour  Intake   1000 ml  Output     55 ml  Net    945 ml     BLOOD ADMINISTERED:none   LOCAL MEDICATIONS USED:  Nesacaine 1%, Amount: 20 ml  SPECIMEN:  Source of Specimen:  Resection and endometrial curettings  DISPOSITION OF SPECIMEN:  PATHOLOGY  COUNTS:  YES  PLAN OF CARE: Transfer to PACU    Genia Del MD  08/25/2012 at 1:55 pm

## 2012-08-25 NOTE — Discharge Summary (Signed)
  Physician Discharge Summary  Patient ID: Lori Mooney MRN: [id number] DOB/AGE: 1945-04-22 67 y.o.  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 *   Discharged Condition: good  Hospital Course: good  Consults: None  Treatments: surgery: Hysteroscopy with Versapoint resection, D+C  Disposition: Final discharge disposition not confirmed   Future Appointments Provider Department Dept Phone   09/06/2012 7:30 AM [facility] Main Office [place]) [phone]   09/12/2012 2:00 PM Cassell Clement, MD [facility] Main Office [place]) [phone]       Medication List         atorvastatin 20 MG tablet  Commonly known as:  LIPITOR  Take 1 tablet (20 mg total) by mouth daily.     Fish Oil 1200 MG Caps  Take 1,200 mg by mouth daily.     ibuprofen 200 MG tablet  Commonly known as:  ADVIL,MOTRIN  Take 400 mg by mouth every 6 (six) hours as needed for pain.     lisinopril-hydrochlorothiazide 20-12.5 MG per tablet  Commonly known as:  PRINZIDE,ZESTORETIC  Take 1 tablet by mouth daily.     Magnesium 250 MG Tabs  Take 250-500 mg by mouth at bedtime. 250 mg for sleep and 500 mg for constipation     VITAMIN B12 PO  Take 1 tablet by mouth daily.     Vitamin D-3 1000 UNITS Caps  Take 1,000 Units by mouth daily.     ZANTAC PO  Take 1 tablet by mouth daily as needed. For heartburn           Follow-up Information   Follow up with Althia Egolf,MARIE-LYNE, MD In 3 weeks.   Contact information:   [address] [place] [address] [phone]       Signed: Genia Del, MD 08/25/2012, 2:05 PM

## 2012-08-25 NOTE — H&P (Signed)
Lori Mooney is an 67 y.o. female   RP:  PMB IU lesion for [facility] resection, D+C  Pertinent Gynecological History: Menses: post-menopausal Bleeding: post menopausal bleeding Contraception: none Blood transfusions: none Sexually transmitted diseases: no past history Last mammogram: normal Last pap: normal     Menstrual History:  No LMP recorded.    Past Medical History  Diagnosis Date  . Hypertension   . Hyperlipidemia     hypercholesterolemia  . Exogenous obesity   . 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 Tubal ligation    . Tonsillectomy    . Cataract extraction w/ intraocular lens implant Left     Family History  Problem Relation Age of Onset  . Hyperlipidemia Mother     Social History:  reports that she has never smoked. She does not have any smokeless tobacco history on file. She reports that she does not drink alcohol or use illicit drugs.  Allergies:  Allergies  Allergen Reactions  . Shellfish Allergy Hives  . Strawberry Hives    Prescriptions prior to admission  Medication Sig Dispense Refill  . Cholecalciferol (VITAMIN D-3) 1000 UNITS CAPS Take 1,000 Units by mouth daily.      . Cyanocobalamin (VITAMIN B12 PO) Take 1 tablet by mouth daily.      [name] ibuprofen (ADVIL,MOTRIN) 200 MG tablet Take 400 mg by mouth every 6 (six) hours as needed for pain.      [name] lisinopril-hydrochlorothiazide (PRINZIDE,ZESTORETIC) 20-12.5 MG per tablet Take 1 tablet by mouth daily.  30 tablet  6  . Magnesium 250 MG TABS Take 250-500 mg by mouth at bedtime. 250 mg for sleep and 500 mg for constipation      . Omega-3 Fatty Acids (FISH OIL) 1200 MG CAPS Take 1,200 mg by mouth daily.      . Ranitidine HCl (ZANTAC PO) Take 1 tablet by mouth daily as needed. For heartburn      . atorvastatin (LIPITOR) 20 MG tablet Take 1 tablet (20 mg total) by mouth daily.  90 tablet  3     Blood pressure 130/76, pulse 88,  temperature 97.7 F (36.5 C), resp. rate 18, SpO2 100.00%.  Sonohysto  IU lesion.  No results found for this or any previous visit (from the past 24 hour(s)).  No results found.  Assessment/Plan: PMB with endometrial polyp.  [facility]SC Versapoint resection, D+C.  Surgery/risks reviewed.  Lilas Diefendorf,MARIE-LYNE 08/25/2012, 12:52 PM

## 2012-08-25 NOTE — Anesthesia Preprocedure Evaluation (Signed)
Anesthesia Evaluation  Patient identified by MRN, date of birth, ID band Patient awake    Reviewed: Allergy & Precautions, H&P , NPO status , Patient's Chart, lab work & pertinent test results  Airway Mallampati: II TM Distance: >3 FB Neck ROM: Full    Dental no notable dental hx. (+) Teeth Intact   Pulmonary neg pulmonary ROS,  breath sounds clear to auscultation  Pulmonary exam normal       Cardiovascular hypertension, Pt. on medications negative cardio ROS  Rhythm:Regular Rate:Normal     Neuro/Psych negative neurological ROS  negative psych ROS   GI/Hepatic negative GI ROS, Neg liver ROS, GERD-  Medicated and Controlled,  Endo/Other  negative endocrine ROS  Renal/GU negative Renal ROS  negative genitourinary   Musculoskeletal negative musculoskeletal ROS (+)   Abdominal Normal abdominal exam  (+) + obese,   Peds  Hematology negative hematology ROS (+)   Anesthesia Other Findings   Reproductive/Obstetrics Endometrial polyp                           Anesthesia Physical Anesthesia Plan  ASA: II  Anesthesia Plan: General   Post-op Pain Management:    Induction: Intravenous  Airway Management Planned: LMA  Additional Equipment:   Intra-op Plan:   Post-operative Plan: Extubation in OR  Informed Consent: I have reviewed the patients History and Physical, chart, labs and discussed the procedure including the risks, benefits and alternatives for the proposed anesthesia with the patient or authorized representative who has indicated his/her understanding and acceptance.   Dental advisory given  Plan Discussed with: CRNA, Anesthesiologist and Surgeon  Anesthesia Plan Comments:         Anesthesia Quick Evaluation

## 2012-08-26 ENCOUNTER — Encounter (HOSPITAL_COMMUNITY): Payer: Self-pay | Admitting: Obstetrics & Gynecology

## 2012-08-26 DIAGNOSIS — N979 Female infertility, unspecified: Secondary | ICD-10-CM | POA: Diagnosis not present

## 2012-08-29 NOTE — Anesthesia Postprocedure Evaluation (Signed)
  Anesthesia Post-op Note  Patient: Lori Mooney  Procedure(s) Performed: Procedure(s): DILATATION & CURETTAGE/HYSTEROSCOPY WITH VERSAPOINT RESECTION (N/A) Patient is awake and responsive. Pain and nausea are reasonably well controlled. Vital signs are stable and clinically acceptable. Oxygen saturation is clinically acceptable. There are no apparent anesthetic complications at this time. Patient is ready for discharge.

## 2012-08-31 ENCOUNTER — Other Ambulatory Visit: Payer: Self-pay

## 2012-09-06 ENCOUNTER — Other Ambulatory Visit (INDEPENDENT_AMBULATORY_CARE_PROVIDER_SITE_OTHER): Payer: Medicare Other

## 2012-09-06 DIAGNOSIS — E7800 Pure hypercholesterolemia, unspecified: Secondary | ICD-10-CM | POA: Diagnosis not present

## 2012-09-06 LAB — BASIC METABOLIC PANEL
BUN: 19 mg/dL (ref 6–23)
GFR: 63.95 mL/min (ref >60.00)
Potassium: 4.2 mEq/L (ref 3.5–5.1)

## 2012-09-06 LAB — HEPATIC FUNCTION PANEL
ALT: 24 U/L (ref 0–35)
Albumin: 4.1 g/dL (ref 3.5–5.2)
Bilirubin, Direct: 0 mg/dL (ref 0.0–0.3)
Total Protein: 7.1 g/dL (ref 6.0–8.3)

## 2012-09-06 LAB — LIPID PANEL
Cholesterol: 184 mg/dL (ref 0–200)
Total CHOL/HDL Ratio: 3

## 2012-09-06 LAB — LDL CHOLESTEROL, DIRECT: Direct LDL: 95.2 mg/dL

## 2012-09-06 NOTE — Progress Notes (Signed)
Quick Note:  Please make copy of labs for patient visit. ______ 

## 2012-09-12 ENCOUNTER — Ambulatory Visit (INDEPENDENT_AMBULATORY_CARE_PROVIDER_SITE_OTHER): Payer: Medicare Other | Admitting: Cardiology

## 2012-09-12 ENCOUNTER — Encounter: Payer: Self-pay | Admitting: Cardiology

## 2012-09-12 VITALS — BP 124/78 | HR 72 | Ht 63.0 in | Wt 198.0 lb

## 2012-09-12 DIAGNOSIS — E781 Pure hyperglyceridemia: Secondary | ICD-10-CM

## 2012-09-12 DIAGNOSIS — E7800 Pure hypercholesterolemia, unspecified: Secondary | ICD-10-CM | POA: Diagnosis not present

## 2012-09-12 DIAGNOSIS — I119 Hypertensive heart disease without heart failure: Secondary | ICD-10-CM | POA: Diagnosis not present

## 2012-09-12 NOTE — Patient Instructions (Addendum)
Work harder on diet   Your physician recommends that you continue on your current medications as directed. Please refer to the Current Medication list given to you today.  Your physician wants you to follow-up in: 6 months with fasting labs (lp/bmet/hfp)  You will receive a reminder letter in the mail two months in advance. If you don't receive a letter, please call our office to schedule the follow-up appointment.

## 2012-09-12 NOTE — Assessment & Plan Note (Signed)
We reviewed her labs.  Cholesterol is satisfactory but triglycerides are much higher reflecting weight gain and lack of exercise.  We talked about the importance of getting back on a strict diet and exercising regularly.

## 2012-09-12 NOTE — Assessment & Plan Note (Signed)
Blood pressure was remaining stable on lisinopril HCTZ

## 2012-09-12 NOTE — Progress Notes (Signed)
Lori Mooney Date of Birth:  07-27-1945 [facility] [address] [place], [place]  [address] [phone]  Fax   [phone]  HPI: This pleasant 67 year old woman is seen for a scheduled followup office visit. She has a history of hypertension and hypercholesterolemia. She has a problem with chronic exogenous obesity.  She has had a very busy summer.  She has not been exercising.  They spent several weeks up in [place] moving her parents into an assisted living facility.  Also the patient herself had a D&C for postmenopausal bleeding earlier this summer at [facility].  Fortunately no sign of cancer was found.   Current Outpatient Prescriptions  Medication Sig Dispense Refill  . atorvastatin (LIPITOR) 20 MG tablet Take 1 tablet (20 mg total) by mouth daily.  90 tablet  3  . Cholecalciferol (VITAMIN D-3) 1000 UNITS CAPS Take 1,000 Units by mouth daily.      . Cyanocobalamin (VITAMIN B12 PO) Take 1 tablet by mouth daily.      [name] ibuprofen (ADVIL,MOTRIN) 200 MG tablet Take 400 mg by mouth every 6 (six) hours as needed for pain.      [name] lisinopril-hydrochlorothiazide (PRINZIDE,ZESTORETIC) 20-12.5 MG per tablet Take 1 tablet by mouth daily.  30 tablet  6  . Magnesium 250 MG TABS Take 250-500 mg by mouth at bedtime. 250 mg for sleep and 500 mg for constipation      . Omega-3 Fatty Acids (FISH OIL) 1200 MG CAPS Take 1,200 mg by mouth daily.      . Ranitidine HCl (ZANTAC PO) Take 1 tablet by mouth daily as needed. For heartburn       No current facility-administered medications for this visit.    Allergies  Allergen Reactions  . Shellfish Allergy Hives  . Strawberry Hives    Patient Active Problem List   Diagnosis Date Noted  . UTI (urinary tract infection) 03/02/2012  . Vertigo 03/02/2012  . Hypercholesterolemia 10/08/2010  . Benign hypertensive heart disease without heart failure 10/08/2010  . Obesity 10/08/2010    History  Smoking status  . Never Smoker    Smokeless tobacco  . Not on file    History  Alcohol Use No    Family History  Problem Relation Age of Onset  . Hyperlipidemia Mother     Review of Systems: The patient denies any heat or cold intolerance.  No weight gain or weight loss.  The patient denies headaches or blurry vision.  There is no cough or sputum production.  The patient denies dizziness.  There is no hematuria or hematochezia.  The patient denies any muscle aches or arthritis.  The patient denies any rash.  The patient denies frequent falling or instability.  There is no history of depression or anxiety.  All other systems were reviewed and are negative.   Physical Exam: Filed Vitals:   09/12/12 1420  BP: 124/78  Pulse: 72   the general appearance reveals a pleasant overweight woman in no distress.  Weight is up 3 pounds since last visit.The head and neck exam reveals pupils equal and reactive.  Extraocular movements are full.  There is no scleral icterus.  The mouth and pharynx are normal.  The neck is supple.  The carotids reveal no bruits.  The jugular venous pressure is normal.  The  thyroid is not enlarged.  There is no lymphadenopathy.  The chest is clear to percussion and auscultation.  There are no rales or rhonchi.  Expansion of  the chest is symmetrical.  The precordium is quiet.  The first heart sound is normal.  The second heart sound is physiologically split.  There is no murmur gallop rub or click.  There is no abnormal lift or heave.  The abdomen is soft and nontender.  The bowel sounds are normal.  The liver and spleen are not enlarged.  There are no abdominal masses.  There are no abdominal bruits.  Extremities reveal good pedal pulses.  There is no phlebitis or edema.  There is no cyanosis or clubbing.  Strength is normal and symmetrical in all extremities.  There is no lateralizing weakness.  There are no sensory deficits.  The skin is warm and dry.  There is no rash.      Assessment / Plan:   continue same medication.  Work harder on diet.  Recheck 6 months for followup office visit lipid panel hepatic function panel and basal metabolic panel

## 2012-09-20 ENCOUNTER — Other Ambulatory Visit: Payer: Self-pay | Admitting: *Deleted

## 2012-09-20 MED ORDER — LISINOPRIL-HYDROCHLOROTHIAZIDE 20-12.5 MG PO TABS: 1.0000 | ORAL_TABLET | Freq: Every day | ORAL | Status: DC

## 2012-10-28 ENCOUNTER — Ambulatory Visit (INDEPENDENT_AMBULATORY_CARE_PROVIDER_SITE_OTHER): Payer: Medicare Other

## 2012-10-28 ENCOUNTER — Ambulatory Visit: Payer: Self-pay

## 2012-10-28 DIAGNOSIS — Z23 Encounter for immunization: Secondary | ICD-10-CM | POA: Diagnosis not present

## 2012-11-28 DIAGNOSIS — Z961 Presence of intraocular lens: Secondary | ICD-10-CM | POA: Diagnosis not present

## 2012-11-28 DIAGNOSIS — H04129 Dry eye syndrome of unspecified lacrimal gland: Secondary | ICD-10-CM | POA: Diagnosis not present

## 2012-11-28 DIAGNOSIS — H2510 Age-related nuclear cataract, unspecified eye: Secondary | ICD-10-CM | POA: Diagnosis not present

## 2013-02-07 DIAGNOSIS — Z85828 Personal history of other malignant neoplasm of skin: Secondary | ICD-10-CM | POA: Diagnosis not present

## 2013-02-07 DIAGNOSIS — D239 Other benign neoplasm of skin, unspecified: Secondary | ICD-10-CM | POA: Diagnosis not present

## 2013-02-07 DIAGNOSIS — L820 Inflamed seborrheic keratosis: Secondary | ICD-10-CM | POA: Diagnosis not present

## 2013-02-07 DIAGNOSIS — L821 Other seborrheic keratosis: Secondary | ICD-10-CM | POA: Diagnosis not present

## 2013-02-07 DIAGNOSIS — L909 Atrophic disorder of skin, unspecified: Secondary | ICD-10-CM | POA: Diagnosis not present

## 2013-02-07 DIAGNOSIS — L819 Disorder of pigmentation, unspecified: Secondary | ICD-10-CM | POA: Diagnosis not present

## 2013-02-07 DIAGNOSIS — L570 Actinic keratosis: Secondary | ICD-10-CM | POA: Diagnosis not present

## 2013-02-07 DIAGNOSIS — D2360 Other benign neoplasm of skin of unspecified upper limb, including shoulder: Secondary | ICD-10-CM | POA: Diagnosis not present

## 2013-02-21 ENCOUNTER — Other Ambulatory Visit: Payer: Self-pay | Admitting: *Deleted

## 2013-02-21 DIAGNOSIS — E559 Vitamin D deficiency, unspecified: Secondary | ICD-10-CM

## 2013-02-21 DIAGNOSIS — R635 Abnormal weight gain: Secondary | ICD-10-CM

## 2013-03-08 ENCOUNTER — Telehealth: Payer: Self-pay | Admitting: Cardiology

## 2013-03-08 NOTE — Telephone Encounter (Signed)
New problem   Marker test that she needed to be added to her labs is plac test. Please call pt

## 2013-03-10 NOTE — Telephone Encounter (Signed)
Discussed Lp-PLA2 with Sharion Dove D and this test is usually done if patients are not taking a statin drug. Patient is already taking Lipitor. Discussed with patient and she will just discuss with  Dr. Mare Ferrari at her ov next week.

## 2013-03-13 ENCOUNTER — Other Ambulatory Visit (INDEPENDENT_AMBULATORY_CARE_PROVIDER_SITE_OTHER): Payer: Medicare Other

## 2013-03-13 DIAGNOSIS — E559 Vitamin D deficiency, unspecified: Secondary | ICD-10-CM

## 2013-03-13 DIAGNOSIS — R635 Abnormal weight gain: Secondary | ICD-10-CM

## 2013-03-13 DIAGNOSIS — E781 Pure hyperglyceridemia: Secondary | ICD-10-CM

## 2013-03-13 LAB — HEPATIC FUNCTION PANEL
ALK PHOS: 59 U/L (ref 39–117)
ALT: 24 U/L (ref 0–35)
AST: 21 U/L (ref 0–37)
Albumin: 4.1 g/dL (ref 3.5–5.2)
BILIRUBIN DIRECT: 0 mg/dL (ref 0.0–0.3)
Total Bilirubin: 0.7 mg/dL (ref 0.3–1.2)
Total Protein: 6.8 g/dL (ref 6.0–8.3)

## 2013-03-13 LAB — BASIC METABOLIC PANEL
BUN: 12 mg/dL (ref 6–23)
CHLORIDE: 107 meq/L (ref 96–112)
CO2: 26 meq/L (ref 19–32)
CREATININE: 0.8 mg/dL (ref 0.4–1.2)
Calcium: 9.5 mg/dL (ref 8.4–10.5)
GFR: 74.88 mL/min (ref >60.00)
Glucose, Bld: 92 mg/dL (ref 70–99)
POTASSIUM: 4 meq/L (ref 3.5–5.1)
Sodium: 140 mEq/L (ref 135–145)

## 2013-03-13 LAB — LIPID PANEL
CHOL/HDL RATIO: 3
Cholesterol: 173 mg/dL (ref 0–200)
HDL: 58.4 mg/dL (ref >39.00)
LDL CALC: 75 mg/dL (ref 0–99)
TRIGLYCERIDES: 196 mg/dL — AB (ref 0.0–149.0)
VLDL: 39.2 mg/dL (ref 0.0–40.0)

## 2013-03-13 LAB — TSH: TSH: 3.62 u[IU]/mL (ref 0.35–5.50)

## 2013-03-13 LAB — T4, FREE: Free T4: 0.91 ng/dL (ref 0.60–1.60)

## 2013-03-14 ENCOUNTER — Other Ambulatory Visit: Payer: Medicare Other

## 2013-03-14 NOTE — Progress Notes (Signed)
Quick Note:  Please make copy of labs for patient visit. ______ 

## 2013-03-16 LAB — VITAMIN D 25 HYDROXY (VIT D DEFICIENCY, FRACTURES): Vit D, 25-Hydroxy: 70 ng/mL (ref 30–89)

## 2013-03-16 NOTE — Progress Notes (Signed)
Quick Note:  Please report to patient. The recent labs are stable. Continue same medication and careful diet. Vitamin D level is excellent ______

## 2013-03-21 ENCOUNTER — Ambulatory Visit (INDEPENDENT_AMBULATORY_CARE_PROVIDER_SITE_OTHER): Payer: Medicare Other | Admitting: Cardiology

## 2013-03-21 ENCOUNTER — Encounter: Payer: Self-pay | Admitting: Cardiology

## 2013-03-21 VITALS — BP 126/80 | HR 72 | Ht 63.0 in | Wt 197.8 lb

## 2013-03-21 DIAGNOSIS — I119 Hypertensive heart disease without heart failure: Secondary | ICD-10-CM

## 2013-03-21 DIAGNOSIS — E7800 Pure hypercholesterolemia, unspecified: Secondary | ICD-10-CM

## 2013-03-21 DIAGNOSIS — N390 Urinary tract infection, site not specified: Secondary | ICD-10-CM

## 2013-03-21 MED ORDER — ATORVASTATIN CALCIUM 20 MG PO TABS: 20.0000 mg | ORAL_TABLET | Freq: Every day | ORAL | Status: DC

## 2013-03-21 MED ORDER — AZITHROMYCIN 250 MG PO TABS: ORAL_TABLET | ORAL | Status: DC

## 2013-03-21 MED ORDER — LISINOPRIL-HYDROCHLOROTHIAZIDE 20-12.5 MG PO TABS: 1.0000 | ORAL_TABLET | Freq: Every day | ORAL | Status: DC

## 2013-03-21 NOTE — Assessment & Plan Note (Signed)
The patient has not been having any chest pain or shortness of breath.  She has not been aware of any racing of her heart or palpitations.  No dizziness or syncope.

## 2013-03-21 NOTE — Assessment & Plan Note (Signed)
Patient has not had any further urinary tract infections.

## 2013-03-21 NOTE — Patient Instructions (Signed)
Your physician recommends that you continue on your current medications as directed. Please refer to the Current Medication list given to you today.  Your physician wants you to follow-up in: 6 months with fasting labs (lp/bmet/hfp)  You will receive a reminder letter in the mail two months in advance. If you don't receive a letter, please call our office to schedule the follow-up appointment.  

## 2013-03-21 NOTE — Assessment & Plan Note (Signed)
The patient's lipid levels are satisfactory on current dose of Lipitor 20 mg daily.  No myalgias.

## 2013-03-21 NOTE — Progress Notes (Signed)
Lori Mooney Date of Birth:  12/25/1945 [address] [place], [place]  [address] [phone]  Fax   [phone]  HPI: This pleasant 68 year old woman is seen for a scheduled followup office visit. She has a history of hypertension and hypercholesterolemia. She has a problem with chronic exogenous obesity.  She is trying to exercise on a more regular basis by [facility].  She is still eating some carbohydrates and sweets.  Her weight is essentially unchanged since last visit.   Current Outpatient Prescriptions  Medication Sig Dispense Refill  . atorvastatin (LIPITOR) 20 MG tablet Take 1 tablet (20 mg total) by mouth daily.  90 tablet  3  . Cholecalciferol (VITAMIN D-3) 1000 UNITS CAPS Take 1,000 Units by mouth daily.      . Cyanocobalamin (VITAMIN B12 PO) Take 1 tablet by mouth daily.      [name] ibuprofen (ADVIL,MOTRIN) 200 MG tablet Take 400 mg by mouth every 6 (six) hours as needed for pain.      [name] lisinopril-hydrochlorothiazide (PRINZIDE,ZESTORETIC) 20-12.5 MG per tablet Take 1 tablet by mouth daily.  90 tablet  3  . Magnesium 250 MG TABS Take 250-500 mg by mouth at bedtime. 250 mg for sleep and 500 mg for constipation      . Omega-3 Fatty Acids (FISH OIL) 1200 MG CAPS Take 1,200 mg by mouth daily.      . Ranitidine HCl (ZANTAC PO) Take 1 tablet by mouth daily as needed. For heartburn      . azithromycin (ZITHROMAX) 250 MG tablet 2 tablets on day 1 and then daily until finished  6 each  0   No current facility-administered medications for this visit.    Allergies  Allergen Reactions  . Shellfish Allergy Hives  . Strawberry Hives    Patient Active Problem List   Diagnosis Date Noted  . UTI (urinary tract infection) 03/02/2012  . Vertigo 03/02/2012  . Hypercholesterolemia 10/08/2010  . Benign hypertensive heart disease without heart failure 10/08/2010  . Obesity 10/08/2010    History  Smoking status  . Never Smoker   Smokeless  tobacco  . Not on file    History  Alcohol Use No    Family History  Problem Relation Age of Onset  . Hyperlipidemia Mother     Review of Systems: The patient denies any heat or cold intolerance.  No weight gain or weight loss.  The patient denies headaches or blurry vision.  There is no cough or sputum production.  The patient denies dizziness.  There is no hematuria or hematochezia.  The patient denies any muscle aches or arthritis.  The patient denies any rash.  The patient denies frequent falling or instability.  There is no history of depression or anxiety.  All other systems were reviewed and are negative.   Physical Exam: Filed Vitals:   03/21/13 0905  BP: 126/80  Pulse: 72   the general appearance reveals a pleasant overweight woman in no distress.  Weight is up 3 pounds since last visit.The head and neck exam reveals pupils equal and reactive.  Extraocular movements are full.  There is no scleral icterus.  The mouth and pharynx are normal.  The neck is supple.  The carotids reveal no bruits.  The jugular venous pressure is normal.  The  thyroid is not enlarged.  There is no lymphadenopathy.  The chest is clear to percussion and auscultation.  There are no rales or rhonchi.  Expansion of the chest is symmetrical.  The precordium is quiet.  The first heart sound is normal.  The second heart sound is physiologically split.  There is no murmur gallop rub or click.  There is no abnormal lift or heave.  The abdomen is soft and nontender.  The bowel sounds are normal.  The liver and spleen are not enlarged.  There are no abdominal masses.  There are no abdominal bruits.  Extremities reveal good pedal pulses.  There is no phlebitis or edema.  There is no cyanosis or clubbing.  Strength is normal and symmetrical in all extremities.  There is no lateralizing weakness.  There are no sensory deficits.  The skin is warm and dry.  There is no rash.      Assessment / Plan:  continue same  medication.  Work harder on diet.  We talked at length about the need to severely limit carbohydrate intake and sweets intake. Recheck 6 months for followup office visit lipid panel hepatic function panel and basal metabolic panel. She and her husband will be going to [place] for an anniversary trip in May and we gave her a prescription for Z-Pak to take with her.

## 2013-03-28 ENCOUNTER — Other Ambulatory Visit: Payer: Self-pay

## 2013-03-28 DIAGNOSIS — Z1231 Encounter for screening mammogram for malignant neoplasm of breast: Secondary | ICD-10-CM

## 2013-04-18 ENCOUNTER — Ambulatory Visit
Admission: RE | Admit: 2013-04-18 | Discharge: 2013-04-18 | Disposition: A | Discharge status: Home / Self Care | Payer: Medicare Other | Source: Ambulatory Visit

## 2013-04-18 DIAGNOSIS — Z1231 Encounter for screening mammogram for malignant neoplasm of breast: Secondary | ICD-10-CM

## 2013-09-03 IMAGING — MG MM DIGITAL SCREENING BILAT
8 series · 8 of 24 positions shown · non-contrast
Comparison: Previous exams.

CLINICAL DATA: Screening.

DIGITAL BILATERAL SCREENING MAMMOGRAM WITH CAD
DIGITAL BREAST TOMOSYNTHESIS
Digital breast tomosynthesis images are acquired in two
projections.  These images are reviewed in combination with the
digital mammogram, confirming the findings below.

[L CC]
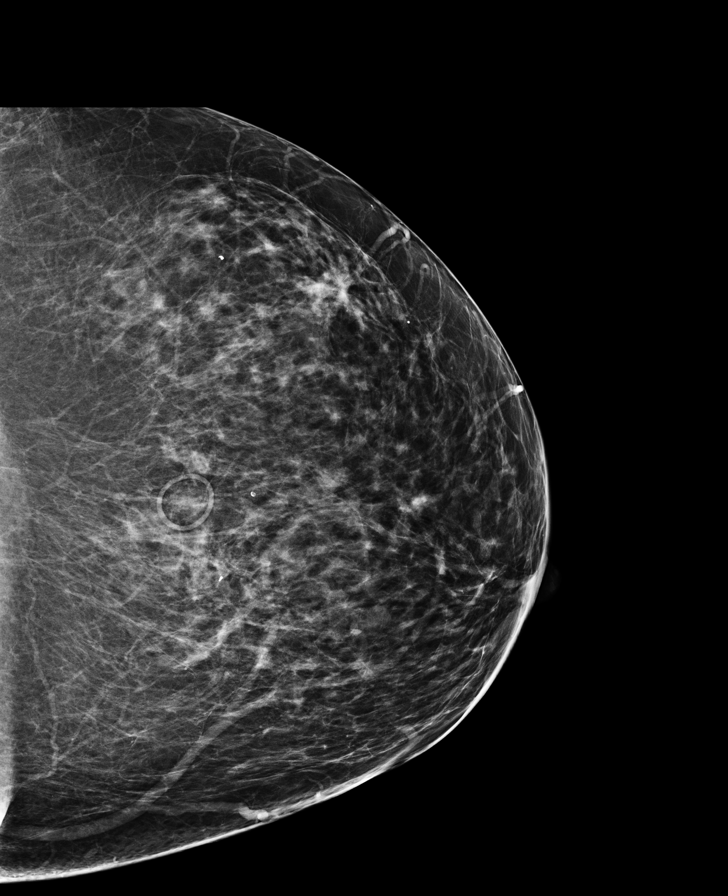

[R CC]
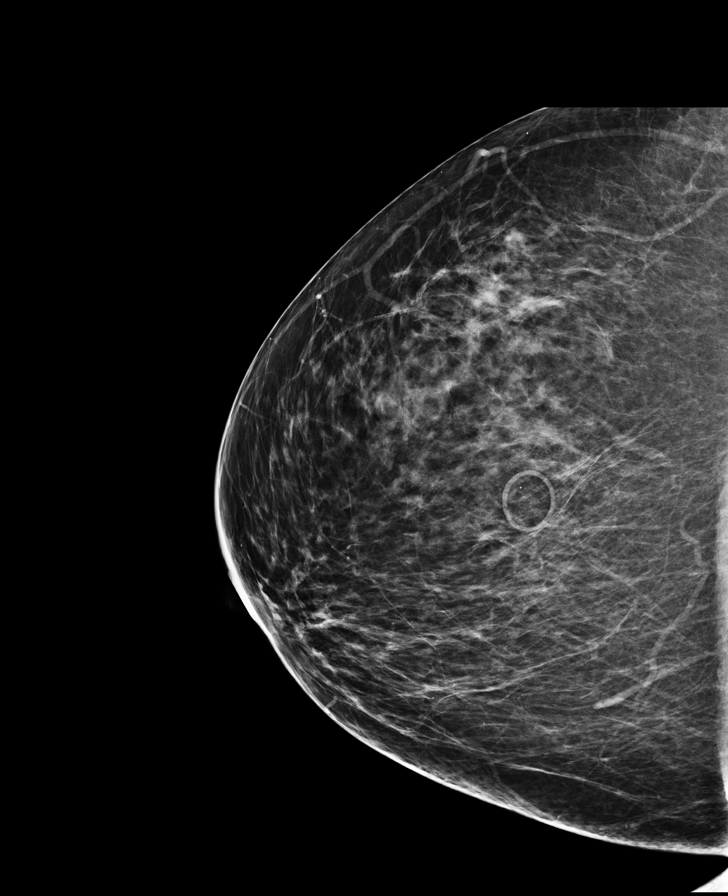

[L MLO]
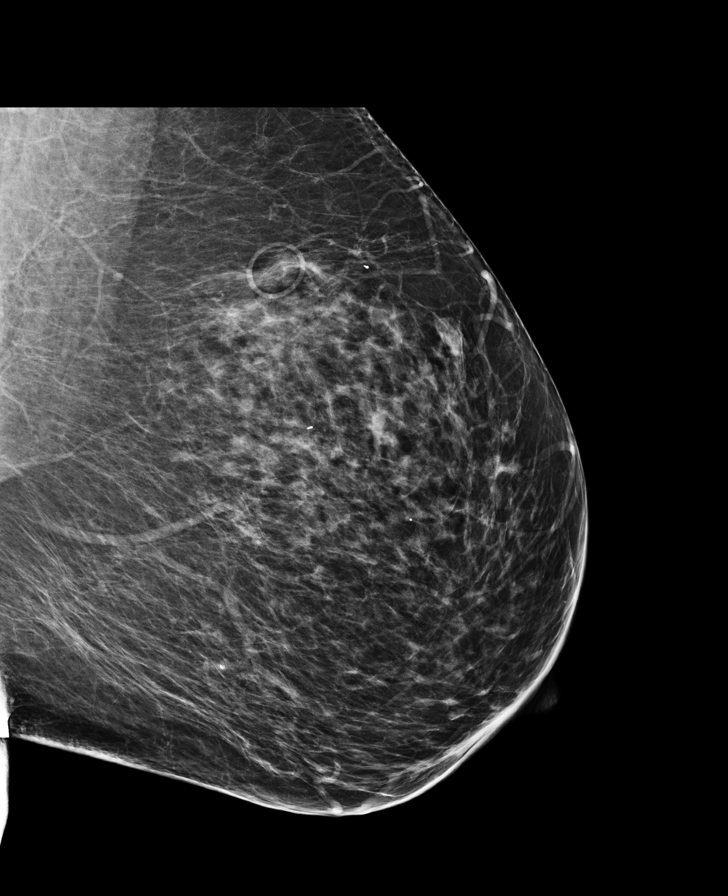

[R MLO]
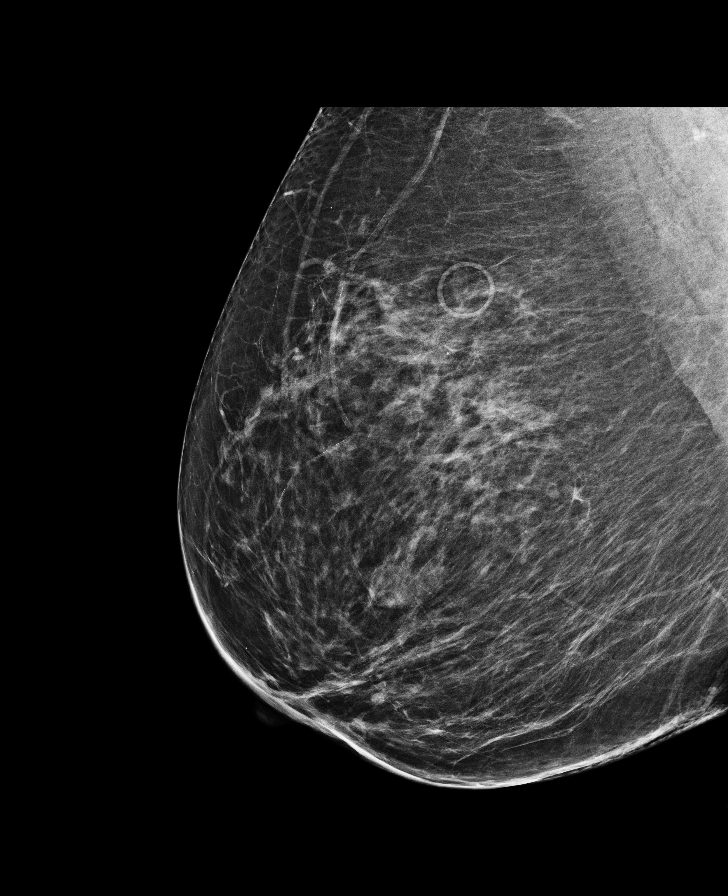

[R MLO tomo · tomo slice 45/90.0]
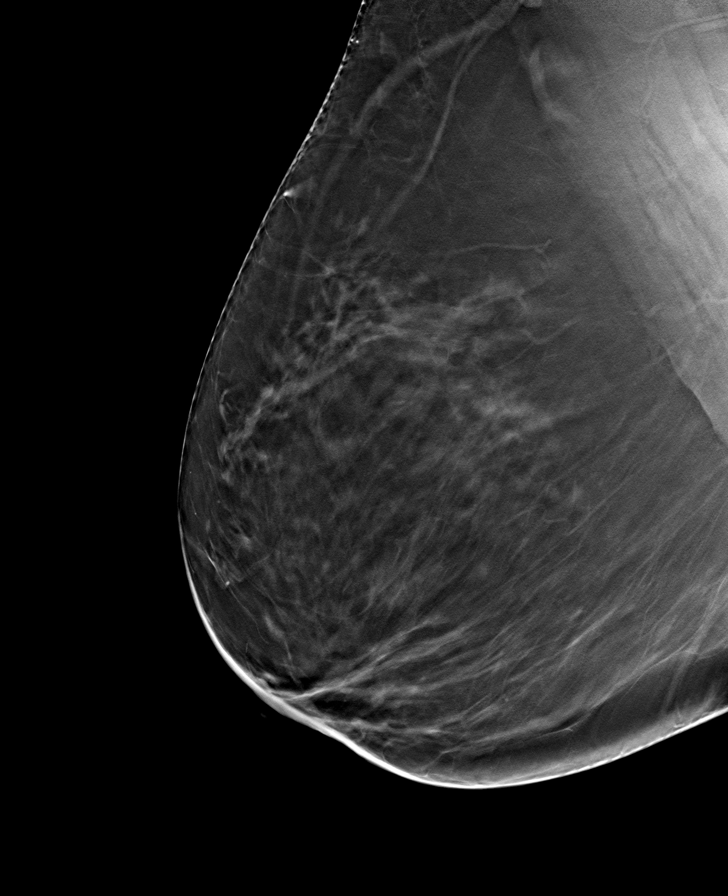

[L MLO tomo · tomo slice 43/85.0]
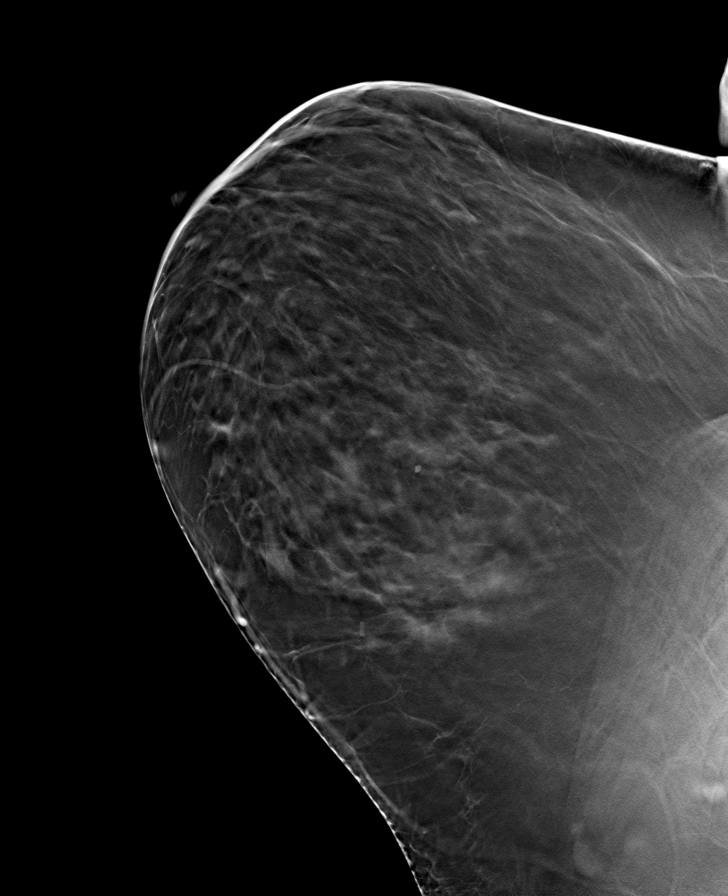

[R CC tomo · tomo slice 45/90.0]
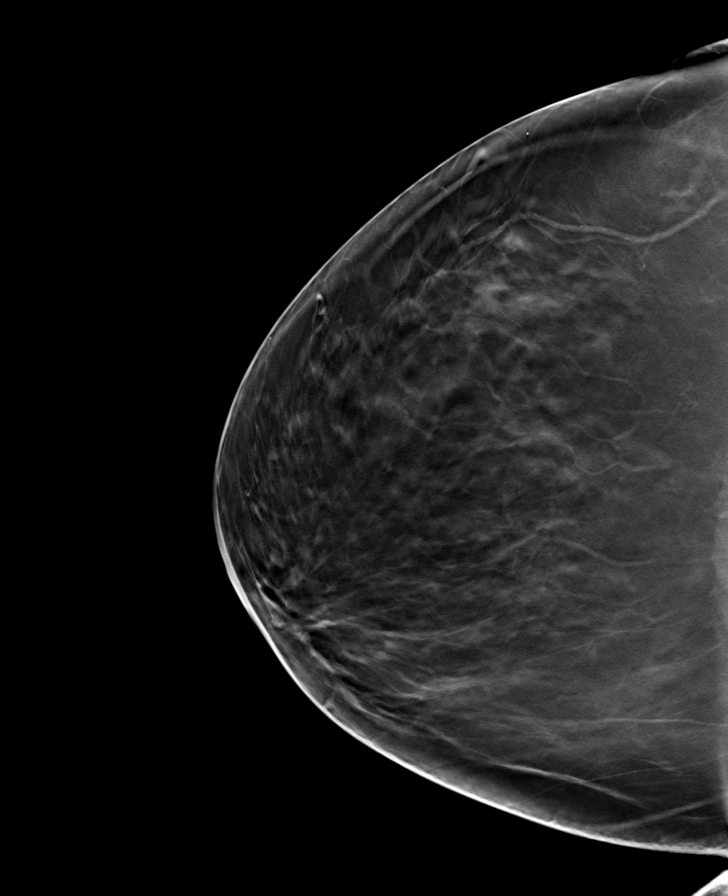

[L CC tomo · tomo slice 40/79.0]
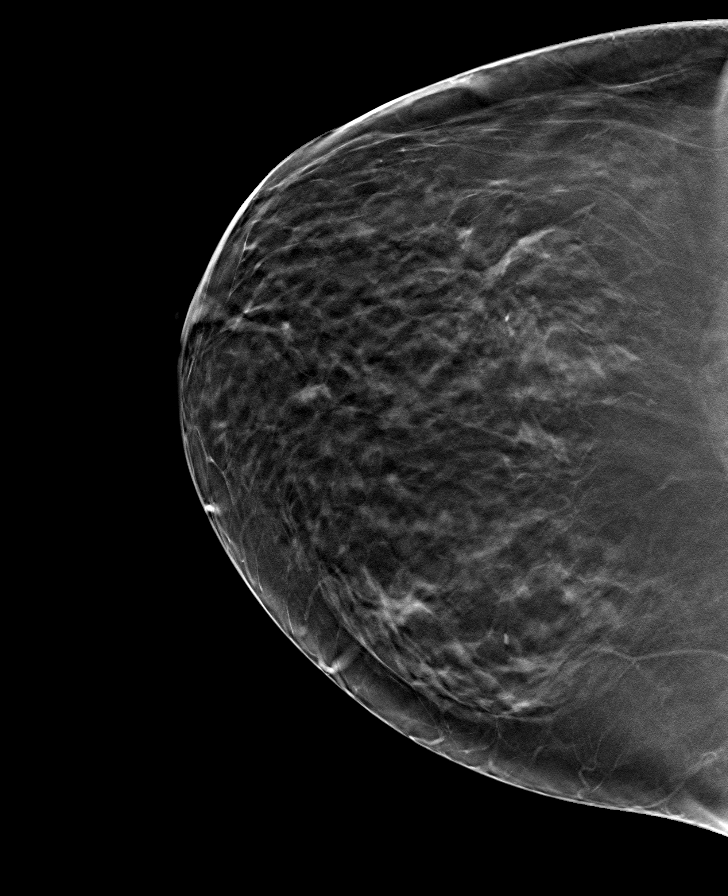

[8 of 24 positions shown; findings below may reference images not displayed]

FINDINGS: ACR Breast Density Category 3: The breast tissue is heterogeneously
dense.

No suspicious masses, architectural distortion, or calcifications
are present.

Images were processed with CAD.
IMPRESSION: No mammographic evidence of malignancy.

A result letter of this screening mammogram will be mailed directly
to the patient.

RECOMMENDATION:
Screening mammogram in one year. (Code:76-4-KYD)

BI-RADS CATEGORY 1:  Negative.

## 2013-09-13 ENCOUNTER — Other Ambulatory Visit (INDEPENDENT_AMBULATORY_CARE_PROVIDER_SITE_OTHER): Payer: Medicare Other

## 2013-09-13 DIAGNOSIS — I119 Hypertensive heart disease without heart failure: Secondary | ICD-10-CM | POA: Diagnosis not present

## 2013-09-13 DIAGNOSIS — E7800 Pure hypercholesterolemia, unspecified: Secondary | ICD-10-CM

## 2013-09-13 LAB — HEPATIC FUNCTION PANEL
ALT: 22 U/L (ref 0–35)
AST: 21 U/L (ref 0–37)
Albumin: 4.1 g/dL (ref 3.5–5.2)
Alkaline Phosphatase: 60 U/L (ref 39–117)
Bilirubin, Direct: 0 mg/dL (ref 0.0–0.3)
Total Bilirubin: 0.7 mg/dL (ref 0.2–1.2)
Total Protein: 6.9 g/dL (ref 6.0–8.3)

## 2013-09-13 LAB — BASIC METABOLIC PANEL
BUN: 14 mg/dL (ref 6–23)
CO2: 26 mEq/L (ref 19–32)
Calcium: 9.6 mg/dL (ref 8.4–10.5)
Chloride: 104 mEq/L (ref 96–112)
Creatinine, Ser: 0.9 mg/dL (ref 0.4–1.2)
GFR: 70.72 mL/min (ref >60.00)
Glucose, Bld: 96 mg/dL (ref 70–99)
Potassium: 4.4 mEq/L (ref 3.5–5.1)
Sodium: 139 mEq/L (ref 135–145)

## 2013-09-13 LAB — LIPID PANEL
Cholesterol: 178 mg/dL (ref 0–200)
HDL: 58.3 mg/dL (ref >39.00)
LDL Cholesterol: 84 mg/dL (ref 0–99)
NonHDL: 119.7
Total CHOL/HDL Ratio: 3
Triglycerides: 178 mg/dL — ABNORMAL HIGH (ref 0.0–149.0)
VLDL: 35.6 mg/dL (ref 0.0–40.0)

## 2013-09-13 NOTE — Progress Notes (Signed)
Quick Note:  Please make copy of labs for patient visit. ______ 

## 2013-09-14 ENCOUNTER — Telehealth: Payer: Self-pay | Admitting: *Deleted

## 2013-09-14 ENCOUNTER — Other Ambulatory Visit: Payer: Medicare Other

## 2013-09-14 NOTE — Telephone Encounter (Signed)
pt's husband notified about lab results with verbal understanding. I released results into MY CHART today as well. Pt's husband said they will just download results from the computer, I said that will be fine.

## 2013-09-19 ENCOUNTER — Encounter: Payer: Self-pay | Admitting: Cardiology

## 2013-09-19 ENCOUNTER — Ambulatory Visit (INDEPENDENT_AMBULATORY_CARE_PROVIDER_SITE_OTHER): Payer: Medicare Other | Admitting: Cardiology

## 2013-09-19 VITALS — BP 110/80 | HR 77 | Ht 64.0 in | Wt 197.0 lb

## 2013-09-19 DIAGNOSIS — E669 Obesity, unspecified: Secondary | ICD-10-CM | POA: Diagnosis not present

## 2013-09-19 DIAGNOSIS — I119 Hypertensive heart disease without heart failure: Secondary | ICD-10-CM

## 2013-09-19 DIAGNOSIS — E7800 Pure hypercholesterolemia, unspecified: Secondary | ICD-10-CM | POA: Diagnosis not present

## 2013-09-19 DIAGNOSIS — K219 Gastro-esophageal reflux disease without esophagitis: Secondary | ICD-10-CM | POA: Diagnosis not present

## 2013-09-19 NOTE — Addendum Note (Signed)
Addended by: Alvina Filbert B on: 09/19/2013 02:26 PM   Modules accepted: Orders

## 2013-09-19 NOTE — Assessment & Plan Note (Signed)
No exertional chest pain.  No increased shortness of breath.  No palpitations dizziness or syncope.  She and her husband go to the gym to work out.

## 2013-09-19 NOTE — Assessment & Plan Note (Signed)
The patient is having nocturnal indigestion relieved by Zantac.  She is wondering if she might have H. Pylori.  She has seen Dr. Cristina Gong  for colonoscopies in the past.  She is not having any hematochezia or melena.  We will arrange for consultation with Dr. Cristina Gong.

## 2013-09-19 NOTE — Assessment & Plan Note (Signed)
The patient has not been able to lose weight.  She has previously seen a dietitian/nutritionist.  I have urged her to eliminate more carbohydrates from her diet.  Exercise alone will not get the weight off.

## 2013-09-19 NOTE — Patient Instructions (Signed)
Your physician recommends that you continue on your current medications as directed. Please refer to the Current Medication list given to you today.  Your physician wants you to follow-up in: 6 months with fasting labs (lp/bmet/hfp/cbc) You will receive a reminder letter in the mail two months in advance. If you don't receive a letter, please call our office to schedule the follow-up appointment.   APPOINTMENT HAS BEEN SCHEDULED FOR YOU TO SEE DR [place] ON 10/18/13 AT 10:15, IF YOU NEED TO RESCHEDULE THE NUMBER [phone]

## 2013-09-19 NOTE — Assessment & Plan Note (Signed)
The patient is on atorvastatin.  She has not had any myalgias.  Bloodwork was reviewed and is satisfactory

## 2013-09-19 NOTE — Progress Notes (Signed)
Delmar Landau Date of Birth:  [birth year] [facility] [address] [place], [place]  [address] [phone]        Fax   [phone]   History of Present Illness: This pleasant 68 year old woman is seen for a scheduled followup office visit. She has a history of hypertension and hypercholesterolemia. She has a problem with chronic exogenous obesity. She is trying to exercise on a more regular basis [facility]. She is still eating some carbohydrates and sweets. Her weight is essentially unchanged since last visit.  She has been having more problems with indigestion which occurs only at night when she lies down to go to sleep.  She has a past history of GERD.  Her symptoms are relieved by taking Zantac.   Current Outpatient Prescriptions  Medication Sig Dispense Refill  . atorvastatin (LIPITOR) 20 MG tablet Take 1 tablet (20 mg total) by mouth daily.  90 tablet  3  . azithromycin (ZITHROMAX) 250 MG tablet 2 tablets on day 1 and then daily until finished  6 each  0  . Cholecalciferol (VITAMIN D-3) 1000 UNITS CAPS Take 1,000 Units by mouth daily.      . Cyanocobalamin (VITAMIN B12 PO) Take 1 tablet by mouth daily.      [name] ibuprofen (ADVIL,MOTRIN) 200 MG tablet Take 400 mg by mouth every 6 (six) hours as needed for pain.      [name] lisinopril-hydrochlorothiazide (PRINZIDE,ZESTORETIC) 20-12.5 MG per tablet Take 1 tablet by mouth daily.  90 tablet  3  . Magnesium 250 MG TABS Take 250-500 mg by mouth at bedtime. 250 mg for sleep and 500 mg for constipation      . Ranitidine HCl (ZANTAC PO) Take 1 tablet by mouth daily as needed. For heartburn       No current facility-administered medications for this visit.    Allergies  Allergen Reactions  . Shellfish Allergy Hives  . Strawberry Hives    Patient Active Problem List   Diagnosis Date Noted  . UTI (urinary tract infection) 03/02/2012  . Vertigo 03/02/2012  . Hypercholesterolemia 10/08/2010    . Benign hypertensive heart disease without heart failure 10/08/2010  . Obesity 10/08/2010    History  Smoking status  . Never Smoker   Smokeless tobacco  . Not on file    History  Alcohol Use No    Family History  Problem Relation Age of Onset  . Hyperlipidemia Mother     Review of Systems: Constitutional: no fever chills diaphoresis or fatigue or change in weight.  Head and neck: no hearing loss, no epistaxis, no photophobia or visual disturbance. Respiratory: No cough, shortness of breath or wheezing. Cardiovascular: No chest pain peripheral edema, palpitations. Gastrointestinal: No abdominal distention, no abdominal pain, no change in bowel habits hematochezia or melena. Genitourinary: No dysuria, no frequency, no urgency, no nocturia. Musculoskeletal:No arthralgias, no back pain, no gait disturbance or myalgias. Neurological: No dizziness, no headaches, no numbness, no seizures, no syncope, no weakness, no tremors. Hematologic: No lymphadenopathy, no easy bruising. Psychiatric: No confusion, no hallucinations, no sleep disturbance.    Physical Exam: Filed Vitals:   09/19/13 0812  BP: 110/80  Pulse: 77  The patient appears to be in no distress.  Head and neck exam reveals that the pupils are equal and reactive.  The extraocular movements are full.  There is no scleral icterus.  Mouth and pharynx are benign.  No lymphadenopathy.  No carotid  bruits.  The jugular venous pressure is normal.  Thyroid is not enlarged or tender.  Chest is clear to percussion and auscultation.  No rales or rhonchi.  Expansion of the chest is symmetrical.  Heart reveals no abnormal lift or heave.  First and second heart sounds are normal.  There is no murmur gallop rub or click.  The abdomen is soft and nontender.  Bowel sounds are normoactive.  There is no hepatosplenomegaly or mass.  There are no abdominal bruits.  Extremities reveal no phlebitis or edema.  Pedal pulses are good.   There is no cyanosis or clubbing.  Neurologic exam is normal strength and no lateralizing weakness.  No sensory deficits.  Integument reveals no rash  EKG shows normal sinus rhythm.  There is poor R-wave progression V1 through V2.  There is no significant change since prior tracing  Assessment / Plan: 1. essential hypertension 2. Hypercholesterolemia 3. Obesity. 4.  GERD  Disposition: Continue same medication.  Work harder on weight loss.  This will also help her symptoms of GERD.  Referral to GI. Recheck here in 6 months for office visit lipid panel hepatic function panel basal metabolic panel and CBC

## 2013-10-18 DIAGNOSIS — K219 Gastro-esophageal reflux disease without esophagitis: Secondary | ICD-10-CM | POA: Diagnosis not present

## 2013-10-18 DIAGNOSIS — E669 Obesity, unspecified: Secondary | ICD-10-CM | POA: Diagnosis not present

## 2013-10-25 DIAGNOSIS — Z124 Encounter for screening for malignant neoplasm of cervix: Secondary | ICD-10-CM | POA: Diagnosis not present

## 2013-10-25 DIAGNOSIS — Z01419 Encounter for gynecological examination (general) (routine) without abnormal findings: Secondary | ICD-10-CM | POA: Diagnosis not present

## 2013-10-27 DIAGNOSIS — K219 Gastro-esophageal reflux disease without esophagitis: Secondary | ICD-10-CM | POA: Diagnosis not present

## 2013-11-07 DIAGNOSIS — Z23 Encounter for immunization: Secondary | ICD-10-CM | POA: Diagnosis not present

## 2013-11-10 ENCOUNTER — Other Ambulatory Visit: Payer: Self-pay

## 2013-11-13 ENCOUNTER — Encounter: Payer: Self-pay | Admitting: Cardiology

## 2013-11-30 DIAGNOSIS — H2513 Age-related nuclear cataract, bilateral: Secondary | ICD-10-CM | POA: Diagnosis not present

## 2013-11-30 DIAGNOSIS — H25013 Cortical age-related cataract, bilateral: Secondary | ICD-10-CM | POA: Diagnosis not present

## 2013-11-30 DIAGNOSIS — Z961 Presence of intraocular lens: Secondary | ICD-10-CM | POA: Diagnosis not present

## 2013-12-27 ENCOUNTER — Telehealth: Payer: Self-pay | Admitting: *Deleted

## 2013-12-27 NOTE — Telephone Encounter (Signed)
New Message   Pt received shot at [facility] around the first of 10/15

## 2014-02-08 DIAGNOSIS — L821 Other seborrheic keratosis: Secondary | ICD-10-CM | POA: Diagnosis not present

## 2014-02-08 DIAGNOSIS — L570 Actinic keratosis: Secondary | ICD-10-CM | POA: Diagnosis not present

## 2014-02-08 DIAGNOSIS — D1801 Hemangioma of skin and subcutaneous tissue: Secondary | ICD-10-CM | POA: Diagnosis not present

## 2014-02-08 DIAGNOSIS — L814 Other melanin hyperpigmentation: Secondary | ICD-10-CM | POA: Diagnosis not present

## 2014-02-08 DIAGNOSIS — L918 Other hypertrophic disorders of the skin: Secondary | ICD-10-CM | POA: Diagnosis not present

## 2014-02-08 DIAGNOSIS — Z85828 Personal history of other malignant neoplasm of skin: Secondary | ICD-10-CM | POA: Diagnosis not present

## 2014-02-08 DIAGNOSIS — L738 Other specified follicular disorders: Secondary | ICD-10-CM | POA: Diagnosis not present

## 2014-02-26 ENCOUNTER — Ambulatory Visit (INDEPENDENT_AMBULATORY_CARE_PROVIDER_SITE_OTHER): Payer: Medicare Other | Admitting: Emergency Medicine

## 2014-02-26 VITALS — BP 120/78 | HR 86 | Temp 97.7°F | Resp 16 | Ht 62.0 in | Wt 196.4 lb

## 2014-02-26 DIAGNOSIS — Z1329 Encounter for screening for other suspected endocrine disorder: Secondary | ICD-10-CM | POA: Diagnosis not present

## 2014-02-26 DIAGNOSIS — R42 Dizziness and giddiness: Secondary | ICD-10-CM

## 2014-02-26 LAB — GLUCOSE, POCT (MANUAL RESULT ENTRY): POC GLUCOSE: 82 mg/dL (ref 70–99)

## 2014-02-26 LAB — POCT CBC
Granulocyte percent: 63.4 %G (ref 37–80)
HCT, POC: 46.7 % (ref 37.7–47.9)
HEMOGLOBIN: 15.3 g/dL (ref 12.2–16.2)
Lymph, poc: 2.7 (ref 0.6–3.4)
MCH: 29.3 pg (ref 27–31.2)
MCHC: 32.7 g/dL (ref 31.8–35.4)
MCV: 89.6 fL (ref 80–97)
MID (cbc): 0.5 (ref 0–0.9)
MPV: 8.4 fL (ref 0–99.8)
POC GRANULOCYTE: 5.7 (ref 2–6.9)
POC LYMPH PERCENT: 30.5 %L (ref 10–50)
POC MID %: 6.1 %M (ref 0–12)
Platelet Count, POC: 316 10*3/uL (ref 142–424)
RBC: 5.22 M/uL (ref 4.04–5.48)
RDW, POC: 13.9 %
WBC: 9 10*3/uL (ref 4.6–10.2)

## 2014-02-26 MED ORDER — MECLIZINE HCL 12.5 MG PO TABS: 12.5000 mg | ORAL_TABLET | Freq: Three times a day (TID) | ORAL | Status: DC | PRN

## 2014-02-26 NOTE — Progress Notes (Addendum)
Subjective:  This chart was scribed for Lori Queen MD, by Lori Mooney, at [facility].  This patient was seen in room 1 and the patient's care was started at 1:14 PM.    Patient ID: Lori Mooney, female    DOB: Apr 19, 1945, 69 y.o.   MRN: [id number]  HPI HPI Comments: Lori Mooney is a 69 y.o. female who presents to [facility] for worsening diziness onset two weeks ago.  She notes that her left ear has more pressure with a rare ringing at times.  Notes that she is not able to get out of bed/ get proper sleep because she has episodes when she is laying in bed/turning.  She denies having episodes during the day when she is walking around or upright. She called ENT to make an appointment and they told her she had to come here before being able to make an appointment. Patient has had a cataract removed.  She denies double vision or any other symptoms.    Patient Active Problem List   Diagnosis Date Noted  . GERD (gastroesophageal reflux disease) 09/19/2013  . UTI (urinary tract infection) 03/02/2012  . Vertigo 03/02/2012  . Hypercholesterolemia 10/08/2010  . Benign hypertensive heart disease without heart failure 10/08/2010  . Obesity 10/08/2010   Past Medical History  Diagnosis Date  . Hypertension   . Hyperlipidemia     hypercholesterolemia  . Exogenous obesity   . 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 Tubal ligation    . Tonsillectomy    . Cataract extraction w/ intraocular lens implant Left   . Dilitation & currettage/hystroscopy with versapoint resection N/A 08/25/2012    Procedure: DILATATION & CURETTAGE/HYSTEROSCOPY WITH VERSAPOINT RESECTION;  Surgeon: Princess Bruins, MD;  Location: [facility];  Service: Gynecology;  Laterality: N/A;   Allergies  Allergen Reactions  . Shellfish Allergy Hives  . Strawberry Hives   Prior to Admission medications   Medication  Sig Start Date End Date Taking? Authorizing Provider  atorvastatin (LIPITOR) 20 MG tablet Take 1 tablet (20 mg total) by mouth daily. 03/21/13  Yes Darlin Coco, MD  Cholecalciferol (VITAMIN D-3) 1000 UNITS CAPS Take 1,000 Units by mouth daily.   Yes Historical Provider, MD  Cyanocobalamin (VITAMIN B12 PO) Take 1 tablet by mouth daily.   Yes Historical Provider, MD  ibuprofen (ADVIL,MOTRIN) 200 MG tablet Take 400 mg by mouth every 6 (six) hours as needed for pain.   Yes Historical Provider, MD  lisinopril-hydrochlorothiazide (PRINZIDE,ZESTORETIC) 20-12.5 MG per tablet Take 1 tablet by mouth daily. 03/21/13  Yes Darlin Coco, MD  Magnesium 250 MG TABS Take 250-500 mg by mouth at bedtime. 250 mg for sleep and 500 mg for constipation   Yes Historical Provider, MD  [place] Take by mouth once.   Yes Historical Provider, MD  Ranitidine HCl (ZANTAC PO) Take 1 tablet by mouth daily as needed. For heartburn   Yes Historical Provider, MD   History   Social History  . Marital Status: Married    Spouse Name: N/A    Number of Children: N/A  . Years of Education: N/A   Occupational History  . Not on file.   Social History Main Topics  . Smoking status: Never Smoker   . Smokeless tobacco: Not on file  . Alcohol Use: No  . Drug Use: No  . Sexual Activity:  Not on file   Other Topics Concern  . Not on file   Social History Narrative       Review of Systems  Constitutional: Negative for fever and chills.  Eyes: Negative for visual disturbance.  Neurological: Positive for dizziness.  Psychiatric/Behavioral: Positive for sleep disturbance.       Objective:   Physical Exam  CONSTITUTIONAL: Well developed/well nourished HEAD: Normocephalic/atraumatic EYES: EOMI/PERRL ENMT: Mucous membranes moist--ears are normal Hearing testing shows air greater than bone conduction both ears Weber testing doe not localize NECK: supple no meningeal signs SPINE/BACK:entire  spine nontender CV: S1/S2 noted, no murmurs/rubs/gallops noted LUNGS: Lungs are clear to auscultation bilaterally, no apparent distress ABDOMEN: soft, nontender, no rebound or guarding, bowel sounds noted throughout abdomen GU:no cva tenderness NEURO: Pt is awake/alert/appropriate, moves all extremitiesx4.  No facial droop.  --- when i had the patient lay down and turn her head to the left and right she did experience any dizziness or show any nystagmus.  EXTREMITIES: pulses normal/equal, full ROM SKIN: warm, color normal PSYCH: no abnormalities of mood noted, alert and oriented to situation I personally performed the services described in this documentation, which was scribed in my presence. The recorded information has been reviewed and is accurate.   Filed Vitals:   02/26/14 1245  BP: 120/78  Pulse: 86  Temp: 97.7 F (36.5 C)  TempSrc: Oral  Resp: 16  Height: 5\' 2"  (1.575 m)  Weight: 196 lb 6.4 oz (89.086 kg)  SpO2: 97%   Results for orders placed or performed in visit on 02/26/14  POCT CBC  Result Value Ref Range   WBC 9.0 4.6 - 10.2 K/uL   Lymph, poc 2.7 0.6 - 3.4   POC LYMPH PERCENT 30.5 10 - 50 %L   MID (cbc) 0.5 0 - 0.9   POC MID % 6.1 0 - 12 %M   POC Granulocyte 5.7 2 - 6.9   Granulocyte percent 63.4 37 - 80 %G   RBC 5.22 4.04 - 5.48 M/uL   Hemoglobin 15.3 12.2 - 16.2 g/dL   HCT, POC 46.7 37.7 - 47.9 %   MCV 89.6 80 - 97 fL   MCH, POC 29.3 27 - 31.2 pg   MCHC 32.7 31.8 - 35.4 g/dL   RDW, POC 13.9 %   Platelet Count, POC 316 142 - 424 K/uL   MPV 8.4 0 - 99.8 fL  POCT glucose (manual entry)  Result Value Ref Range   POC Glucose 82 70 - 99 mg/dl        Assessment & Plan:  I suspect she has an otolith. We'll get her over to the ENT. She referral to Dr. Constance Holster.I personally performed the services described in this documentation, which was scribed in my presence. The recorded information has been reviewed and is accurate. I did not order any imaging. I would like  her to see ENT first. I did give her a prescription for meclizine to try.

## 2014-02-26 NOTE — Patient Instructions (Signed)

## 2014-02-27 LAB — COMPREHENSIVE METABOLIC PANEL
ALBUMIN: 4.6 g/dL (ref 3.5–5.2)
ALK PHOS: 73 U/L (ref 39–117)
ALT: 27 U/L (ref 0–35)
AST: 21 U/L (ref 0–37)
BUN: 14 mg/dL (ref 6–23)
CO2: 25 meq/L (ref 19–32)
CREATININE: 0.87 mg/dL (ref 0.50–1.10)
Calcium: 10.1 mg/dL (ref 8.4–10.5)
Chloride: 102 mEq/L (ref 96–112)
GLUCOSE: 79 mg/dL (ref 70–99)
POTASSIUM: 4.5 meq/L (ref 3.5–5.3)
SODIUM: 139 meq/L (ref 135–145)
Total Bilirubin: 0.5 mg/dL (ref 0.2–1.2)
Total Protein: 7 g/dL (ref 6.0–8.3)

## 2014-02-27 LAB — TSH: TSH: 1.981 u[IU]/mL (ref 0.350–4.500)

## 2014-03-21 ENCOUNTER — Other Ambulatory Visit (INDEPENDENT_AMBULATORY_CARE_PROVIDER_SITE_OTHER): Payer: Medicare Other | Admitting: *Deleted

## 2014-03-21 DIAGNOSIS — I119 Hypertensive heart disease without heart failure: Secondary | ICD-10-CM | POA: Diagnosis not present

## 2014-03-21 DIAGNOSIS — E7800 Pure hypercholesterolemia, unspecified: Secondary | ICD-10-CM

## 2014-03-21 LAB — CBC WITH DIFFERENTIAL/PLATELET
BASOS PCT: 0.6 % (ref 0.0–3.0)
Basophils Absolute: 0 10*3/uL (ref 0.0–0.1)
EOS ABS: 0.2 10*3/uL (ref 0.0–0.7)
Eosinophils Relative: 3 % (ref 0.0–5.0)
HEMATOCRIT: 44.4 % (ref 36.0–46.0)
Hemoglobin: 14.9 g/dL (ref 12.0–15.0)
Lymphocytes Relative: 44.5 % (ref 12.0–46.0)
Lymphs Abs: 3.1 10*3/uL (ref 0.7–4.0)
MCHC: 33.5 g/dL (ref 30.0–36.0)
MCV: 87.1 fl (ref 78.0–100.0)
MONO ABS: 0.4 10*3/uL (ref 0.1–1.0)
Monocytes Relative: 6.2 % (ref 3.0–12.0)
NEUTROS PCT: 45.7 % (ref 43.0–77.0)
Neutro Abs: 3.2 10*3/uL (ref 1.4–7.7)
Platelets: 323 10*3/uL (ref 150.0–400.0)
RBC: 5.1 Mil/uL (ref 3.87–5.11)
RDW: 13.6 % (ref 11.5–15.5)
WBC: 6.9 10*3/uL (ref 4.0–10.5)

## 2014-03-21 LAB — HEPATIC FUNCTION PANEL
ALBUMIN: 4.4 g/dL (ref 3.5–5.2)
ALT: 25 U/L (ref 0–35)
AST: 22 U/L (ref 0–37)
Alkaline Phosphatase: 63 U/L (ref 39–117)
Bilirubin, Direct: 0.1 mg/dL (ref 0.0–0.3)
Total Bilirubin: 0.5 mg/dL (ref 0.2–1.2)
Total Protein: 6.9 g/dL (ref 6.0–8.3)

## 2014-03-21 LAB — BASIC METABOLIC PANEL
BUN: 18 mg/dL (ref 6–23)
CO2: 28 meq/L (ref 19–32)
Calcium: 9.9 mg/dL (ref 8.4–10.5)
Chloride: 104 mEq/L (ref 96–112)
Creatinine, Ser: 0.89 mg/dL (ref 0.40–1.20)
GFR: 66.96 mL/min (ref >60.00)
Glucose, Bld: 99 mg/dL (ref 70–99)
POTASSIUM: 4.1 meq/L (ref 3.5–5.1)
Sodium: 136 mEq/L (ref 135–145)

## 2014-03-21 LAB — LIPID PANEL
CHOL/HDL RATIO: 3
Cholesterol: 171 mg/dL (ref 0–200)
HDL: 57.4 mg/dL (ref >39.00)
LDL Cholesterol: 81 mg/dL (ref 0–99)
NONHDL: 113.6
Triglycerides: 161 mg/dL — ABNORMAL HIGH (ref 0.0–149.0)
VLDL: 32.2 mg/dL (ref 0.0–40.0)

## 2014-03-21 NOTE — Progress Notes (Signed)
Quick Note:  Please make copy of labs for patient visit. ______ 

## 2014-03-22 ENCOUNTER — Other Ambulatory Visit: Payer: Self-pay

## 2014-03-22 DIAGNOSIS — Z1231 Encounter for screening mammogram for malignant neoplasm of breast: Secondary | ICD-10-CM

## 2014-03-26 ENCOUNTER — Other Ambulatory Visit: Payer: Self-pay | Admitting: Cardiology

## 2014-03-26 ENCOUNTER — Ambulatory Visit (INDEPENDENT_AMBULATORY_CARE_PROVIDER_SITE_OTHER): Payer: Medicare Other | Admitting: Cardiology

## 2014-03-26 ENCOUNTER — Encounter: Payer: Self-pay | Admitting: Cardiology

## 2014-03-26 VITALS — BP 118/82 | HR 73 | Ht 62.0 in | Wt 198.0 lb

## 2014-03-26 DIAGNOSIS — K219 Gastro-esophageal reflux disease without esophagitis: Secondary | ICD-10-CM | POA: Diagnosis not present

## 2014-03-26 DIAGNOSIS — I119 Hypertensive heart disease without heart failure: Secondary | ICD-10-CM | POA: Diagnosis not present

## 2014-03-26 DIAGNOSIS — E7800 Pure hypercholesterolemia, unspecified: Secondary | ICD-10-CM

## 2014-03-26 DIAGNOSIS — E669 Obesity, unspecified: Secondary | ICD-10-CM | POA: Diagnosis not present

## 2014-03-26 NOTE — Progress Notes (Signed)
Cardiology Office Note   Date:  03/26/2014   ID:  Lori Mooney, DOB 1945/08/12, MRN [id number]  PCP:  Jenny Reichmann, MD  Cardiologist:   Darlin Coco, MD   No chief complaint on file.     History of Present Illness: Lori Mooney is a 69 y.o. female who presents for follow-up office visit  This pleasant 69 year old woman is seen for a scheduled followup office visit. She has a history of hypertension and hypercholesterolemia. She has a problem with chronic exogenous obesity. She is trying to exercise on a more regular basis [facility]. She is still eating some carbohydrates and sweets. Her weight is essentially unchanged since last visit. She has been having more problems with indigestion which occurs only at night when she lies down to go to sleep. She has a past history of GERD. Her symptoms are relieved by taking Zantac.  Since last visit she has also had a bout of vertigo . Past Medical History  Diagnosis Date  . Hypertension   . Hyperlipidemia     hypercholesterolemia  . Exogenous obesity   . 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 Tubal ligation    . Tonsillectomy    . Cataract extraction w/ intraocular lens implant Left   . Dilitation & currettage/hystroscopy with versapoint resection N/A 08/25/2012    Procedure: DILATATION & CURETTAGE/HYSTEROSCOPY WITH VERSAPOINT RESECTION;  Surgeon: Princess Bruins, MD;  Location: [facility];  Service: Gynecology;  Laterality: N/A;     Current Outpatient Prescriptions  Medication Sig Dispense Refill  . atorvastatin (LIPITOR) 20 MG tablet Take 1 tablet (20 mg total) by mouth daily. 90 tablet 3  . Cholecalciferol (VITAMIN D-3) 1000 UNITS CAPS Take 1,000 Units by mouth daily.    . Cyanocobalamin (VITAMIN B12 PO) Take 1 tablet by mouth daily.    [name] ibuprofen (ADVIL,MOTRIN) 200 MG tablet Take 400 mg by mouth every 6 (six) hours as needed  for pain.    [name] lisinopril-hydrochlorothiazide (PRINZIDE,ZESTORETIC) 20-12.5 MG per tablet Take 1 tablet by mouth daily. 90 tablet 3  . Magnesium 250 MG TABS Take 250-500 mg by mouth at bedtime. 250 mg for sleep and 500 mg for constipation    . meclizine (ANTIVERT) 12.5 MG tablet Take 1 tablet (12.5 mg total) by mouth 3 (three) times daily as needed for dizziness. 30 tablet 0  . Ranitidine HCl (ZANTAC PO) Take 1 tablet by mouth daily as needed (Takes one tablet by mouth for GERD). For heartburn     No current facility-administered medications for this visit.    Allergies:   Shellfish allergy and Strawberry    Social History:  The patient  reports that she has never smoked. She does not have any smokeless tobacco history on file. She reports that she does not drink alcohol or use illicit drugs.   Family History:  The patient's family history includes Heart disease in her mother; Hyperlipidemia in her mother; Hypertension in her father and mother.    ROS:  Please see the history of present illness.   Otherwise, review of systems are positive for none.   All other systems are reviewed and negative.    PHYSICAL EXAM: VS:  BP 118/82 mmHg  Pulse 73  Ht 5\' 2"  (1.575 m)  Wt 198 lb (89.812 kg)  BMI 36.21 kg/m2  SpO2 97% , BMI Body mass  index is 36.21 kg/(m^2). GEN: Well nourished, well developed, in no acute distress HEENT: normal Neck: no JVD, carotid bruits, or masses Cardiac: RRR; no murmurs, rubs, or gallops,no edema  Respiratory:  clear to auscultation bilaterally, normal work of breathing GI: soft, nontender, nondistended, + BS MS: no deformity or atrophy Skin: warm and dry, no rash Neuro:  Strength and sensation are intact Psych: euthymic mood, full affect   EKG:  EKG is not ordered today.    Recent Labs: 02/26/2014: TSH 1.981 03/21/2014: ALT 25; BUN 18; Creatinine 0.89; Hemoglobin 14.9; Platelets 323.0; Potassium 4.1; Sodium 136    Lipid Panel    Component Value Date/Time    CHOL 171 03/21/2014 0738   TRIG 161.0* 03/21/2014 0738   HDL 57.40 03/21/2014 0738   CHOLHDL 3 03/21/2014 0738   VLDL 32.2 03/21/2014 0738   LDLCALC 81 03/21/2014 0738   LDLDIRECT 95.2 09/06/2012 0814      Wt Readings from Last 3 Encounters:  03/26/14 198 lb (89.812 kg)  02/26/14 196 lb 6.4 oz (89.086 kg)  09/19/13 197 lb (89.359 kg)        ASSESSMENT AND PLAN:  1. essential hypertension 2. Hypercholesterolemia 3. Obesity. 4. GERD  Disposition: Continue same medication. Work harder on weight loss. This will also help her symptoms of GERD.Since we last saw her she had endoscopy with Dr. Cristina Gong  Recheck here in 9 months for office visit lipid panel hepatic function panel basal metabolic panel and  EKG.  Thereafter we will plan to see her once a year  Current medicines are reviewed at length with the patient today.  The patient does not have concerns regarding medicines.    Labs/ tests ordered today include:  No orders of the defined types were placed in this encounter.     Disposition:   FU  Dr. Mare Ferrari in 9 months for office visit and lab work   Signed, Darlin Coco, MD  03/26/2014 1:22 PM    [facility] [place], [place],   [address] Phone: [phone]; Fax: [phone]

## 2014-03-26 NOTE — Patient Instructions (Signed)
Your physician recommends that you continue on your current medications as directed. Please refer to the Current Medication list given to you today.  Your physician wants you to follow-up in: in September with fasting labs  You will receive a reminder letter in the mail two months in advance. If you don't receive a letter, please call our office to schedule the follow-up appointment.

## 2014-04-19 ENCOUNTER — Ambulatory Visit (INDEPENDENT_AMBULATORY_CARE_PROVIDER_SITE_OTHER): Payer: Medicare Other

## 2014-04-19 ENCOUNTER — Encounter: Payer: Self-pay | Admitting: Emergency Medicine

## 2014-04-19 ENCOUNTER — Ambulatory Visit (INDEPENDENT_AMBULATORY_CARE_PROVIDER_SITE_OTHER): Payer: Medicare Other | Admitting: Emergency Medicine

## 2014-04-19 VITALS — BP 130/84 | HR 83 | Temp 98.5°F | Resp 16 | Ht 62.25 in | Wt 195.8 lb

## 2014-04-19 DIAGNOSIS — Z0000 Encounter for general adult medical examination without abnormal findings: Secondary | ICD-10-CM | POA: Diagnosis not present

## 2014-04-19 DIAGNOSIS — R8290 Unspecified abnormal findings in urine: Secondary | ICD-10-CM

## 2014-04-19 DIAGNOSIS — R059 Cough, unspecified: Secondary | ICD-10-CM

## 2014-04-19 DIAGNOSIS — J189 Pneumonia, unspecified organism: Secondary | ICD-10-CM

## 2014-04-19 DIAGNOSIS — R8299 Other abnormal findings in urine: Secondary | ICD-10-CM | POA: Diagnosis not present

## 2014-04-19 DIAGNOSIS — R05 Cough: Secondary | ICD-10-CM | POA: Diagnosis not present

## 2014-04-19 LAB — POCT URINALYSIS DIPSTICK
BILIRUBIN UA: NEGATIVE
Blood, UA: NEGATIVE
Glucose, UA: NEGATIVE
Ketones, UA: NEGATIVE
NITRITE UA: NEGATIVE
PH UA: 7
Spec Grav, UA: 1.015
Urobilinogen, UA: 0.2

## 2014-04-19 LAB — POCT CBC
Granulocyte percent: 58.8 %G (ref 37–80)
HCT, POC: 48.5 % — AB (ref 37.7–47.9)
HEMOGLOBIN: 14.8 g/dL (ref 12.2–16.2)
Lymph, poc: 3.1 (ref 0.6–3.4)
MCH, POC: 28.2 pg (ref 27–31.2)
MCHC: 30.5 g/dL — AB (ref 31.8–35.4)
MCV: 92.3 fL (ref 80–97)
MID (CBC): 0.3 (ref 0–0.9)
MPV: 9.1 fL (ref 0–99.8)
POC Granulocyte: 4.8 (ref 2–6.9)
POC LYMPH PERCENT: 37.3 %L (ref 10–50)
POC MID %: 3.9 % (ref 0–12)
Platelet Count, POC: 318 10*3/uL (ref 142–424)
RBC: 5.25 M/uL (ref 4.04–5.48)
RDW, POC: 16.8 %
WBC: 8.2 10*3/uL (ref 4.6–10.2)

## 2014-04-19 MED ORDER — BENZONATATE 100 MG PO CAPS: 100.0000 mg | ORAL_CAPSULE | Freq: Three times a day (TID) | ORAL | Status: DC | PRN

## 2014-04-19 MED ORDER — DOXYCYCLINE HYCLATE 100 MG PO TABS: 100.0000 mg | ORAL_TABLET | Freq: Two times a day (BID) | ORAL | Status: DC

## 2014-04-19 NOTE — Progress Notes (Signed)
   Subjective:    Patient ID: Lori Mooney, female    DOB: 01/26/1946, 69 y.o.   MRN: [id number]  HPI    Review of Systems  Constitutional: Negative.   HENT: Negative.   Eyes: Negative.   Respiratory: Negative.   Cardiovascular: Negative.   Gastrointestinal: Negative.   Endocrine: Negative.   Genitourinary: Negative.   Musculoskeletal: Negative.   Skin: Negative.   Allergic/Immunologic: Negative.   Neurological: Negative.   Hematological: Negative.   Psychiatric/Behavioral: Negative.        Objective:   Physical Exam        Assessment & Plan:

## 2014-04-19 NOTE — Progress Notes (Addendum)
Subjective:  This chart was scribed for Lori Russian, MD by [name], at [facility].  This patient was seen in room 23 and the patient's care was started at 8:43 AM.    Patient ID: Lori Mooney, female    DOB: May 13, 1945, 70 y.o.   MRN: [id number]  HPI  HPI Comments: Lori Mooney is a 69 y.o. female who presents to [facility] for an annual physical exam.  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  Patient had associated symptoms of a sore throat and low grade fever.  Patient has had the flu shot this year.  She has not yet had the pneumonia or shingles vaccine.  Patient notes that she has never had the shingles.  She will have her mammogram in a week and is up to date with her colonoscopy. Patient does not want a pap smear and last had one in 2015.  Patient notes that she has been trying to cut out all her sweet and used to go to aerobics classes three days a week but has not recently.  Patient has tried weight watchers but cannot lose more than 10 pounds on that particular program. Patient did not see Dr. Constance Holster for her vertigo after being referred to him from [facility] because she notes her symptoms went away. Patients mother is 56 and has a Psychologist, forensic.  Her father 37, has arthritis and back problems.      Patient Active Problem List   Diagnosis Date Noted  . GERD (gastroesophageal reflux disease) 09/19/2013  . UTI (urinary tract infection) 03/02/2012  . Vertigo 03/02/2012  . Hypercholesterolemia 10/08/2010  . Benign hypertensive heart disease without heart failure 10/08/2010  . Obesity 10/08/2010   Past Medical History  Diagnosis Date  . Hypertension   . Hyperlipidemia     hypercholesterolemia  . Exogenous obesity   . 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 Cataract    Past Surgical History  Procedure Laterality Date   . Tubal ligation    . Tonsillectomy    . Cataract extraction w/ intraocular lens implant Left   . Dilitation & currettage/hystroscopy with versapoint resection N/A 08/25/2012    Procedure: DILATATION & CURETTAGE/HYSTEROSCOPY WITH VERSAPOINT RESECTION;  Surgeon: Princess Bruins, MD;  Location: [facility];  Service: Gynecology;  Laterality: N/A;  . Eye surgery     Allergies  Allergen Reactions  . Shellfish Allergy Hives  . Strawberry Hives   Prior to Admission medications   Medication Sig Start Date End Date Taking? Authorizing Provider  atorvastatin (LIPITOR) 20 MG tablet TAKE ONE TABLET BY MOUTH ONCE DAILY 03/27/14  Yes Darlin Coco, MD  Cholecalciferol (VITAMIN D-3) 1000 UNITS CAPS Take 1,000 Units by mouth daily.   Yes Historical Provider, MD  Cyanocobalamin (VITAMIN B12 PO) Take 1 tablet by mouth daily.   Yes Historical Provider, MD  ibuprofen (ADVIL,MOTRIN) 200 MG tablet Take 400 mg by mouth every 6 (six) hours as needed for pain.   Yes Historical Provider, MD  lisinopril-hydrochlorothiazide (PRINZIDE,ZESTORETIC) 20-12.5 MG per tablet TAKE ONE TABLET BY MOUTH ONCE DAILY 03/27/14  Yes Darlin Coco, MD  Magnesium 250 MG TABS Take 250-500 mg by mouth at bedtime. 250 mg for sleep and 500 mg for constipation   Yes Historical Provider, MD  meclizine (ANTIVERT) 12.5 MG tablet Take 1 tablet (12.5 mg total) by mouth 3 (three) times daily as needed for dizziness. 02/26/14  Yes Lori Russian, MD  Ranitidine HCl (ZANTAC PO) Take 1 tablet by mouth daily as needed (Takes one tablet by mouth for GERD). For heartburn   Yes Historical Provider, MD   History   Social History  . Marital Status: Married    Spouse Name: N/A  . Number of Children: N/A  . Years of Education: N/A   Occupational History  . Retired Therapist, sports    Social History Main Topics  . Smoking status: Never Smoker   . Smokeless tobacco: Not on file  . Alcohol Use: No  . Drug Use: No  . Sexual Activity: Yes   Other Topics Concern    . Not on file   Social History Narrative   Married. Education: [employer].         Review of Systems  Constitutional: Negative for fever and chills.  HENT: Positive for congestion. Negative for drooling.   Eyes: Negative for discharge.  Respiratory: Positive for cough.   Gastrointestinal: Negative for nausea and vomiting.  Skin: Negative for color change.  Neurological: Negative for syncope.       Objective:   Physical Exam CONSTITUTIONAL: Well developed/well nourished HEAD: Normocephalic/atraumatic EYES: EOMI/PERRL ENMT: Mucous membranes moist NECK: supple no meningeal signs SPINE/BACK:entire spine nontender CV: S1/S2 noted, no murmurs/rubs/gallops noted LUNGS: occasional rhonchi ABDOMEN: soft, nontender, no rebound or guarding, bowel sounds noted throughout abdomen GU:no cva tenderness NEURO: Pt is awake/alert/appropriate, moves all extremitiesx4.  No facial droop.   EXTREMITIES: pulses normal/equal, full ROM SKIN: warm, color normal PSYCH: no abnormalities of mood noted, alert and oriented to situation  PELVIC EXAM: The labia are not swollen, no lesions seen, uterus is normal size.  Cervix is smooth.  There are no palpable adnexal masses.    Filed Vitals:   04/19/14 0824  BP: 130/84  Pulse: 83  Temp: 98.5 F (36.9 C)  TempSrc: Oral  Resp: 16  Height: 5' 2.25" (1.581 m)  Weight: 195 lb 12.8 oz (88.814 kg)  SpO2: 96%   [facility] reading (PRIMARY) by  Dr. Everlene Farrier chest x-ray showed a suspicious right middle lobe infiltrate  Results for orders placed or performed in visit on 04/19/14  POCT urinalysis dipstick  Result Value Ref Range   Color, UA yellow    Clarity, UA clear    Glucose, UA neg    Bilirubin, UA neg    Ketones, UA neg    Spec Grav, UA 1.015    Blood, UA neg    pH, UA 7.0    Protein, UA trace    Urobilinogen, UA 0.2    Nitrite, UA neg    Leukocytes, UA small (1+)    Results for orders placed or performed in visit on 04/19/14  POCT urinalysis  dipstick  Result Value Ref Range   Color, UA yellow    Clarity, UA clear    Glucose, UA neg    Bilirubin, UA neg    Ketones, UA neg    Spec Grav, UA 1.015    Blood, UA neg    pH, UA 7.0    Protein, UA trace    Urobilinogen, UA 0.2    Nitrite, UA neg    Leukocytes, UA small (1+)   POCT CBC  Result Value Ref Range   WBC 8.2 4.6 - 10.2 K/uL   Lymph, poc 3.1 0.6 - 3.4  POC LYMPH PERCENT 37.3 10 - 50 %L   MID (cbc) 0.3 0 - 0.9   POC MID % 3.9 0 - 12 %M   POC Granulocyte 4.8 2 - 6.9   Granulocyte percent 58.8 37 - 80 %G   RBC 5.25 4.04 - 5.48 M/uL   Hemoglobin 14.8 12.2 - 16.2 g/dL   HCT, POC 48.5 (A) 37.7 - 47.9 %   MCV 92.3 80 - 97 fL   MCH, POC 28.2 27 - 31.2 pg   MCHC 30.5 (A) 31.8 - 35.4 g/dL   RDW, POC 16.8 %   Platelet Count, POC 318 142 - 424 K/uL   MPV 9.1 0 - 99.8 fL       Assessment & Plan:  We'll cover with Doxy for 10 days because of infiltrate on x-ray she was also given a prescription for [employer]. Radiologist agreed there is an infiltrate in the right middle lobe versus atelectasis. She is afebrile here today. CBC was done.I personally performed the services described in this documentation, which was scribed in my presence. The recorded information has been reviewed and is accurate.

## 2014-04-20 LAB — URINE CULTURE

## 2014-04-27 ENCOUNTER — Ambulatory Visit
Admission: RE | Admit: 2014-04-27 | Discharge: 2014-04-27 | Disposition: A | Discharge status: Home / Self Care | Payer: Medicare Other | Source: Ambulatory Visit

## 2014-04-27 DIAGNOSIS — Z1231 Encounter for screening mammogram for malignant neoplasm of breast: Secondary | ICD-10-CM | POA: Diagnosis not present

## 2014-05-02 ENCOUNTER — Ambulatory Visit (INDEPENDENT_AMBULATORY_CARE_PROVIDER_SITE_OTHER): Payer: Medicare Other | Admitting: Family Medicine

## 2014-05-02 ENCOUNTER — Ambulatory Visit (INDEPENDENT_AMBULATORY_CARE_PROVIDER_SITE_OTHER): Payer: Medicare Other

## 2014-05-02 VITALS — BP 110/72 | HR 78 | Temp 97.9°F | Resp 18 | Ht 62.25 in | Wt 198.4 lb

## 2014-05-02 DIAGNOSIS — Z8701 Personal history of pneumonia (recurrent): Secondary | ICD-10-CM | POA: Diagnosis not present

## 2014-05-02 DIAGNOSIS — R05 Cough: Secondary | ICD-10-CM

## 2014-05-02 DIAGNOSIS — R059 Cough, unspecified: Secondary | ICD-10-CM

## 2014-05-02 LAB — POCT CBC
Granulocyte percent: 53.9 %G (ref 37–80)
HEMATOCRIT: 44.2 % (ref 37.7–47.9)
HEMOGLOBIN: 14.9 g/dL (ref 12.2–16.2)
Lymph, poc: 2.6 (ref 0.6–3.4)
MCH: 29 pg (ref 27–31.2)
MCHC: 33.6 g/dL (ref 31.8–35.4)
MCV: 86.1 fL (ref 80–97)
MID (cbc): 0.6 (ref 0–0.9)
MPV: 8.2 fL (ref 0–99.8)
PLATELET COUNT, POC: 325 10*3/uL (ref 142–424)
POC Granulocyte: 3.7 (ref 2–6.9)
POC LYMPH PERCENT: 37.6 %L (ref 10–50)
POC MID %: 8.5 %M (ref 0–12)
RBC: 5.13 M/uL (ref 4.04–5.48)
RDW, POC: 14 %
WBC: 6.8 10*3/uL (ref 4.6–10.2)

## 2014-05-02 NOTE — Progress Notes (Signed)
Subjective: 69 year old lady who was here for general checkup last week and saw Dr. Everlene Farrier. She had a cough and the chest x-ray indicated a pneumonia or atelectasis. She was treated with a round of doxycycline. She had a little bit of white cells in her urine so urine culture was also done. She has not heard back from the urine culture. She has continued to cough and not feel up to par. No fever. Did feel a little sweaty a couple of times. No other major symptoms.  Objective: Alert and oriented pleasant lady. Her TMs are normal. Throat clear. Neck supple without nodes. Chest is clear to auscultation. Heart regular without any murmurs. No rhonchi, rales, wheezes.  Assessment: Recent pneumonia Malaise Cough  Plan: She asked about her urine culture from last week. I explained that the colony count was probably not significant in number or in morphology.  Chest x-ray and CBC  Results for orders placed or performed in visit on 05/02/14  POCT CBC  Result Value Ref Range   WBC 6.8 4.6 - 10.2 K/uL   Lymph, poc 2.6 0.6 - 3.4   POC LYMPH PERCENT 37.6 10 - 50 %L   MID (cbc) 0.6 0 - 0.9   POC MID % 8.5 0 - 12 %M   POC Granulocyte 3.7 2 - 6.9   Granulocyte percent 53.9 37 - 80 %G   RBC 5.13 4.04 - 5.48 M/uL   Hemoglobin 14.9 12.2 - 16.2 g/dL   HCT, POC 44.2 37.7 - 47.9 %   MCV 86.1 80 - 97 fL   MCH, POC 29.0 27 - 31.2 pg   MCHC 33.6 31.8 - 35.4 g/dL   RDW, POC 14.0 %   Platelet Count, POC 325 142 - 424 K/uL   MPV 8.2 0 - 99.8 fL   The radiology system is down. On the x-ray machine I can see an infiltrate continues to persist, but I cannot get a reading. Will need to get the x-ray report.  [facility] reading (PRIMARY) by  Dr. Linna Darner. X-ray was viewed on the x-ray machine since the system is down. I had no old want to be able to compare with since I cannot enter the system. it appears she has a right middle lobe infiltrate persisting. Will a rate radiology opinion.

## 2014-05-02 NOTE — Patient Instructions (Signed)
Continue to try and do good deep breathing exercises.  There is no evidence that would point to this being something contagious to anyone else at this time. I believe you can safely go on and do yard work.  We will let you know what the radiologist reads and make decisions from there.

## 2014-05-15 ENCOUNTER — Telehealth: Payer: Self-pay

## 2014-05-15 NOTE — Telephone Encounter (Signed)
Pt would like to know the results of her recent x-ray.

## 2014-05-15 NOTE — Telephone Encounter (Signed)
Gave pt. results

## 2014-05-15 NOTE — Telephone Encounter (Signed)
Call; Chest xray normal

## 2014-05-15 NOTE — Telephone Encounter (Signed)
Dr Linna Darner- Pt calling about xray results.

## 2014-07-16 ENCOUNTER — Telehealth: Payer: Self-pay | Admitting: Cardiology

## 2014-07-16 DIAGNOSIS — E7800 Pure hypercholesterolemia, unspecified: Secondary | ICD-10-CM

## 2014-07-16 NOTE — Telephone Encounter (Signed)
New Message  Pt was sure that Dr. Mare Ferrari awnted labs before her September appt- there were no orders but the appt was made for 9/6.

## 2014-07-17 NOTE — Telephone Encounter (Signed)
Left message will be getting fasting labs in September

## 2014-07-23 ENCOUNTER — Other Ambulatory Visit: Payer: Self-pay

## 2014-09-18 ENCOUNTER — Telehealth: Payer: Self-pay | Admitting: Cardiology

## 2014-09-18 DIAGNOSIS — Z8632 Personal history of gestational diabetes: Secondary | ICD-10-CM

## 2014-09-18 NOTE — Telephone Encounter (Signed)
Patient has history of gestational diabetes, added A1C Added to labs

## 2014-09-18 NOTE — Telephone Encounter (Signed)
New Message    Please add an AIC to the blood test

## 2014-10-02 ENCOUNTER — Other Ambulatory Visit (INDEPENDENT_AMBULATORY_CARE_PROVIDER_SITE_OTHER): Payer: Medicare Other | Admitting: *Deleted

## 2014-10-02 DIAGNOSIS — Z8632 Personal history of gestational diabetes: Secondary | ICD-10-CM | POA: Diagnosis not present

## 2014-10-02 DIAGNOSIS — E7800 Pure hypercholesterolemia, unspecified: Secondary | ICD-10-CM

## 2014-10-02 LAB — BASIC METABOLIC PANEL
BUN: 12 mg/dL (ref 6–23)
CHLORIDE: 104 meq/L (ref 96–112)
CO2: 27 mEq/L (ref 19–32)
Calcium: 9.6 mg/dL (ref 8.4–10.5)
Creatinine, Ser: 0.92 mg/dL (ref 0.40–1.20)
GFR: 64.35 mL/min (ref >60.00)
GLUCOSE: 103 mg/dL — AB (ref 70–99)
POTASSIUM: 4.2 meq/L (ref 3.5–5.1)
Sodium: 139 mEq/L (ref 135–145)

## 2014-10-02 LAB — LIPID PANEL
CHOL/HDL RATIO: 3
Cholesterol: 161 mg/dL (ref 0–200)
HDL: 52.6 mg/dL (ref >39.00)
LDL Cholesterol: 73 mg/dL (ref 0–99)
NonHDL: 108.67
Triglycerides: 176 mg/dL — ABNORMAL HIGH (ref 0.0–149.0)
VLDL: 35.2 mg/dL (ref 0.0–40.0)

## 2014-10-02 LAB — HEPATIC FUNCTION PANEL
ALT: 20 U/L (ref 0–35)
AST: 18 U/L (ref 0–37)
Albumin: 4.3 g/dL (ref 3.5–5.2)
Alkaline Phosphatase: 57 U/L (ref 39–117)
BILIRUBIN DIRECT: 0.1 mg/dL (ref 0.0–0.3)
Total Bilirubin: 0.6 mg/dL (ref 0.2–1.2)
Total Protein: 6.8 g/dL (ref 6.0–8.3)

## 2014-10-02 LAB — HEMOGLOBIN A1C: HEMOGLOBIN A1C: 6 % (ref 4.6–6.5)

## 2014-10-02 NOTE — Progress Notes (Signed)
Quick Note:  Please make copy of labs for patient visit. ______ 

## 2014-10-05 ENCOUNTER — Encounter: Payer: Self-pay | Admitting: Cardiology

## 2014-10-05 ENCOUNTER — Ambulatory Visit (INDEPENDENT_AMBULATORY_CARE_PROVIDER_SITE_OTHER): Payer: Medicare Other | Admitting: Cardiology

## 2014-10-05 VITALS — BP 126/68 | HR 80 | Ht 63.0 in | Wt 196.8 lb

## 2014-10-05 DIAGNOSIS — K219 Gastro-esophageal reflux disease without esophagitis: Secondary | ICD-10-CM | POA: Diagnosis not present

## 2014-10-05 DIAGNOSIS — E669 Obesity, unspecified: Secondary | ICD-10-CM | POA: Diagnosis not present

## 2014-10-05 DIAGNOSIS — E7800 Pure hypercholesterolemia, unspecified: Secondary | ICD-10-CM

## 2014-10-05 DIAGNOSIS — I119 Hypertensive heart disease without heart failure: Secondary | ICD-10-CM | POA: Diagnosis not present

## 2014-10-05 NOTE — Progress Notes (Signed)
Cardiology Office Note   Date:  10/05/2014   ID:  ENISA RUNYAN, DOB 02/16/1945, MRN [id number]  PCP:  Jenny Reichmann, MD  Cardiologist: Darlin Coco MD  No chief complaint on file.     History of Present Illness: Lori Mooney is a 69 y.o. female who presents for a six-month follow-up office visit.  This pleasant 69 year old woman is seen for a scheduled followup office visit. She has a history of hypertension and hypercholesterolemia. She has a problem with chronic exogenous obesity. She is trying to exercise on a more regular basis [facility]. She is still eating some carbohydrates and sweets. Her weight is essentially unchanged since last visit. She has been having more problems with indigestion which occurs only at night when she lies down to go to sleep. She has a past history of GERD. Her symptoms are relieved by taking Zantac. Recently she tried a 3 day liquid diet in an effort to jump start her weight loss efforts.  However the diarrhea did not agree with her and she had palpitations for 24 hours.  Past Medical History  Diagnosis Date  . Hypertension   . Hyperlipidemia     hypercholesterolemia  . Exogenous obesity   . 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 Cataract     Past Surgical History  Procedure Laterality Date  . Tubal ligation    . Tonsillectomy    . Cataract extraction w/ intraocular lens implant Left   . Dilitation & currettage/hystroscopy with versapoint resection N/A 08/25/2012    Procedure: DILATATION & CURETTAGE/HYSTEROSCOPY WITH VERSAPOINT RESECTION;  Surgeon: Princess Bruins, MD;  Location: [facility];  Service: Gynecology;  Laterality: N/A;  . Eye surgery       Current Outpatient Prescriptions  Medication Sig Dispense Refill  . atorvastatin (LIPITOR) 20 MG tablet TAKE ONE TABLET BY MOUTH ONCE DAILY 90 tablet 3  . benzonatate (TESSALON) 100 MG capsule Take 1-2 capsules (100-200 mg total) by mouth 3  (three) times daily as needed for cough. 40 capsule 0  . Cholecalciferol (VITAMIN D-3) 1000 UNITS CAPS Take 1,000 Units by mouth daily.    . Cyanocobalamin (VITAMIN B12 PO) Take 1 tablet by mouth daily.    [name] ibuprofen (ADVIL,MOTRIN) 200 MG tablet Take 400 mg by mouth every 6 (six) hours as needed for pain.    [name] lisinopril-hydrochlorothiazide (PRINZIDE,ZESTORETIC) 20-12.5 MG per tablet TAKE ONE TABLET BY MOUTH ONCE DAILY 90 tablet 3  . Magnesium 250 MG TABS Take 250-500 mg by mouth at bedtime. 250 mg for sleep and 500 mg for constipation    . meclizine (ANTIVERT) 12.5 MG tablet Take 1 tablet (12.5 mg total) by mouth 3 (three) times daily as needed for dizziness. 30 tablet 0  . Ranitidine HCl (ZANTAC PO) Take 1 tablet by mouth daily as needed (Takes one tablet by mouth for GERD). For heartburn     No current facility-administered medications for this visit.    Allergies:   Shellfish allergy and Strawberry    Social History:  The patient  reports that she has never smoked. She does not have any smokeless tobacco history on file. She reports that she does not drink alcohol or use illicit drugs.   Family History:  The patient's family history includes Heart disease in her mother; Hyperlipidemia in her mother; Hypertension in her father and mother.    ROS:  Please see the history of present  illness.   Otherwise, review of systems are positive for none.   All other systems are reviewed and negative.    PHYSICAL EXAM: VS:  BP 126/68 mmHg  Pulse 80  Ht 5\' 3"  (1.6 m)  Wt 196 lb 12.8 oz (89.268 kg)  BMI 34.87 kg/m2 , BMI Body mass index is 34.87 kg/(m^2). GEN: Well nourished, well developed, in no acute distress HEENT: normal Neck: no JVD, carotid bruits, or masses Cardiac: RRR; no murmurs, rubs, or gallops,no edema  Respiratory:  clear to auscultation bilaterally, normal work of breathing GI: soft, nontender, nondistended, + BS MS: no deformity or atrophy Skin: warm and dry, no rash Neuro:   Strength and sensation are intact Psych: euthymic mood, full affect   EKG:  EKG is ordered today. The ekg ordered today demonstrates normal sinus rhythm.  Since previous tracing, no significant change.  Poor R-wave progression V1 and V2.   Recent Labs: 02/26/2014: TSH 1.981 03/21/2014: Platelets 323.0 05/02/2014: Hemoglobin 14.9 10/02/2014: ALT 20; BUN 12; Creatinine, Ser 0.92; Potassium 4.2; Sodium 139    Lipid Panel    Component Value Date/Time   CHOL 161 10/02/2014 0829   TRIG 176.0* 10/02/2014 0829   HDL 52.60 10/02/2014 0829   CHOLHDL 3 10/02/2014 0829   VLDL 35.2 10/02/2014 0829   LDLCALC 73 10/02/2014 0829   LDLDIRECT 95.2 09/06/2012 0814      Wt Readings from Last 3 Encounters:  10/05/14 196 lb 12.8 oz (89.268 kg)  05/02/14 198 lb 6.4 oz (89.994 kg)  04/19/14 195 lb 12.8 oz (88.814 kg)        ASSESSMENT AND PLAN:  1. essential hypertension 2. Hypercholesterolemia 3. Obesity. 4. GERD  Disposition: Continue same medication. Continue to stick with a Mediterranean diet type of eating pattern.  Avoid carbs.  Recheck in one year for office visit EKG lipid panel hepatic function panel and basal metabolic   Current medicines are reviewed at length with the patient today.  The patient does not have concerns regarding medicines.  The following changes have been made:  no change  Labs/ tests ordered today include:   Orders Placed This Encounter  Procedures  . EKG 12-Lead   Disposition: No change in meds   Signed, Darlin Coco MD 10/05/2014 5:47 PM    [facility] [address], [place], [place]  [address] Phone: [phone]; Fax: [phone]

## 2014-10-05 NOTE — Patient Instructions (Signed)
Medication Instructions:  Your physician recommends that you continue on your current medications as directed. Please refer to the Current Medication list given to you today.  Labwork: none  Testing/Procedures: none  Follow-Up: Your physician wants you to follow-up in: 1 year  with fasting labs (lp/bmet/hfp) and ekg   You will receive a reminder letter in the mail two months in advance. If you don't receive a letter, please call our office to schedule the follow-up appointment.

## 2014-10-08 ENCOUNTER — Ambulatory Visit: Payer: Medicare Other | Admitting: Cardiology

## 2014-10-30 ENCOUNTER — Encounter: Payer: Self-pay | Admitting: Emergency Medicine

## 2014-10-30 DIAGNOSIS — Z23 Encounter for immunization: Secondary | ICD-10-CM | POA: Diagnosis not present

## 2014-10-31 ENCOUNTER — Encounter: Payer: Self-pay | Admitting: Cardiology

## 2014-11-01 DIAGNOSIS — Z124 Encounter for screening for malignant neoplasm of cervix: Secondary | ICD-10-CM | POA: Diagnosis not present

## 2014-11-01 DIAGNOSIS — Z01419 Encounter for gynecological examination (general) (routine) without abnormal findings: Secondary | ICD-10-CM | POA: Diagnosis not present

## 2014-11-16 ENCOUNTER — Other Ambulatory Visit: Payer: Self-pay | Admitting: Obstetrics & Gynecology

## 2014-11-16 DIAGNOSIS — M8580 Other specified disorders of bone density and structure, unspecified site: Secondary | ICD-10-CM

## 2014-11-20 ENCOUNTER — Ambulatory Visit
Admission: RE | Admit: 2014-11-20 | Discharge: 2014-11-20 | Disposition: A | Discharge status: Home / Self Care | Payer: Medicare Other | Source: Ambulatory Visit | Attending: Obstetrics & Gynecology | Admitting: Obstetrics & Gynecology

## 2014-11-20 DIAGNOSIS — M8580 Other specified disorders of bone density and structure, unspecified site: Secondary | ICD-10-CM

## 2014-11-20 DIAGNOSIS — E2839 Other primary ovarian failure: Secondary | ICD-10-CM | POA: Diagnosis not present

## 2014-11-20 DIAGNOSIS — Z780 Asymptomatic menopausal state: Secondary | ICD-10-CM | POA: Diagnosis not present

## 2015-01-03 DIAGNOSIS — H25011 Cortical age-related cataract, right eye: Secondary | ICD-10-CM | POA: Diagnosis not present

## 2015-01-03 DIAGNOSIS — H2511 Age-related nuclear cataract, right eye: Secondary | ICD-10-CM | POA: Diagnosis not present

## 2015-01-03 DIAGNOSIS — Z961 Presence of intraocular lens: Secondary | ICD-10-CM | POA: Diagnosis not present

## 2015-02-14 DIAGNOSIS — D2261 Melanocytic nevi of right upper limb, including shoulder: Secondary | ICD-10-CM | POA: Diagnosis not present

## 2015-02-14 DIAGNOSIS — D1801 Hemangioma of skin and subcutaneous tissue: Secondary | ICD-10-CM | POA: Diagnosis not present

## 2015-02-14 DIAGNOSIS — L821 Other seborrheic keratosis: Secondary | ICD-10-CM | POA: Diagnosis not present

## 2015-02-14 DIAGNOSIS — D0461 Carcinoma in situ of skin of right upper limb, including shoulder: Secondary | ICD-10-CM | POA: Diagnosis not present

## 2015-02-14 DIAGNOSIS — Z85828 Personal history of other malignant neoplasm of skin: Secondary | ICD-10-CM | POA: Diagnosis not present

## 2015-02-14 DIAGNOSIS — D485 Neoplasm of uncertain behavior of skin: Secondary | ICD-10-CM | POA: Diagnosis not present

## 2015-02-14 DIAGNOSIS — L570 Actinic keratosis: Secondary | ICD-10-CM | POA: Diagnosis not present

## 2015-02-14 DIAGNOSIS — L720 Epidermal cyst: Secondary | ICD-10-CM | POA: Diagnosis not present

## 2015-02-26 DIAGNOSIS — Z85828 Personal history of other malignant neoplasm of skin: Secondary | ICD-10-CM | POA: Diagnosis not present

## 2015-02-26 DIAGNOSIS — D0461 Carcinoma in situ of skin of right upper limb, including shoulder: Secondary | ICD-10-CM | POA: Diagnosis not present

## 2015-03-19 ENCOUNTER — Other Ambulatory Visit: Payer: Self-pay | Admitting: Cardiology

## 2015-04-16 ENCOUNTER — Other Ambulatory Visit: Payer: Self-pay

## 2015-04-16 DIAGNOSIS — Z1231 Encounter for screening mammogram for malignant neoplasm of breast: Secondary | ICD-10-CM

## 2015-04-20 DIAGNOSIS — M19012 Primary osteoarthritis, left shoulder: Secondary | ICD-10-CM | POA: Diagnosis not present

## 2015-04-20 DIAGNOSIS — M7542 Impingement syndrome of left shoulder: Secondary | ICD-10-CM | POA: Diagnosis not present

## 2015-04-20 DIAGNOSIS — M25512 Pain in left shoulder: Secondary | ICD-10-CM | POA: Diagnosis not present

## 2015-05-02 ENCOUNTER — Ambulatory Visit
Admission: RE | Admit: 2015-05-02 | Discharge: 2015-05-02 | Disposition: A | Discharge status: Home / Self Care | Payer: Medicare Other | Source: Ambulatory Visit

## 2015-05-02 DIAGNOSIS — M7542 Impingement syndrome of left shoulder: Secondary | ICD-10-CM | POA: Diagnosis not present

## 2015-05-02 DIAGNOSIS — Z1231 Encounter for screening mammogram for malignant neoplasm of breast: Secondary | ICD-10-CM

## 2015-05-03 ENCOUNTER — Other Ambulatory Visit: Payer: Self-pay | Admitting: Obstetrics & Gynecology

## 2015-05-03 DIAGNOSIS — R928 Other abnormal and inconclusive findings on diagnostic imaging of breast: Secondary | ICD-10-CM

## 2015-05-07 DIAGNOSIS — M7542 Impingement syndrome of left shoulder: Secondary | ICD-10-CM | POA: Diagnosis not present

## 2015-05-09 ENCOUNTER — Ambulatory Visit
Admission: RE | Admit: 2015-05-09 | Discharge: 2015-05-09 | Disposition: A | Discharge status: Home / Self Care | Payer: Medicare Other | Source: Ambulatory Visit | Attending: Obstetrics & Gynecology | Admitting: Obstetrics & Gynecology

## 2015-05-09 DIAGNOSIS — R928 Other abnormal and inconclusive findings on diagnostic imaging of breast: Secondary | ICD-10-CM

## 2015-05-14 DIAGNOSIS — M7542 Impingement syndrome of left shoulder: Secondary | ICD-10-CM | POA: Diagnosis not present

## 2015-05-16 DIAGNOSIS — M7542 Impingement syndrome of left shoulder: Secondary | ICD-10-CM | POA: Diagnosis not present

## 2015-05-20 DIAGNOSIS — M7542 Impingement syndrome of left shoulder: Secondary | ICD-10-CM | POA: Diagnosis not present

## 2015-05-21 DIAGNOSIS — M19012 Primary osteoarthritis, left shoulder: Secondary | ICD-10-CM | POA: Diagnosis not present

## 2015-05-21 DIAGNOSIS — M7542 Impingement syndrome of left shoulder: Secondary | ICD-10-CM | POA: Diagnosis not present

## 2015-06-20 ENCOUNTER — Ambulatory Visit (INDEPENDENT_AMBULATORY_CARE_PROVIDER_SITE_OTHER): Payer: Medicare Other | Admitting: Family Medicine

## 2015-06-20 ENCOUNTER — Encounter: Payer: Self-pay | Admitting: Family Medicine

## 2015-06-20 VITALS — BP 136/80 | HR 80 | Temp 98.6°F | Resp 16 | Ht 62.0 in | Wt 198.0 lb

## 2015-06-20 DIAGNOSIS — Z130 Encounter for screening for diseases of the blood and blood-forming organs and certain disorders involving the immune mechanism: Secondary | ICD-10-CM

## 2015-06-20 DIAGNOSIS — E785 Hyperlipidemia, unspecified: Secondary | ICD-10-CM

## 2015-06-20 DIAGNOSIS — R7303 Prediabetes: Secondary | ICD-10-CM

## 2015-06-20 DIAGNOSIS — E669 Obesity, unspecified: Secondary | ICD-10-CM | POA: Diagnosis not present

## 2015-06-20 DIAGNOSIS — Z1239 Encounter for other screening for malignant neoplasm of breast: Secondary | ICD-10-CM | POA: Diagnosis not present

## 2015-06-20 DIAGNOSIS — Z1389 Encounter for screening for other disorder: Secondary | ICD-10-CM | POA: Diagnosis not present

## 2015-06-20 DIAGNOSIS — R42 Dizziness and giddiness: Secondary | ICD-10-CM

## 2015-06-20 DIAGNOSIS — Z0000 Encounter for general adult medical examination without abnormal findings: Secondary | ICD-10-CM

## 2015-06-20 DIAGNOSIS — Z113 Encounter for screening for infections with a predominantly sexual mode of transmission: Secondary | ICD-10-CM | POA: Diagnosis not present

## 2015-06-20 DIAGNOSIS — Z1383 Encounter for screening for respiratory disorder NEC: Secondary | ICD-10-CM | POA: Diagnosis not present

## 2015-06-20 DIAGNOSIS — I10 Essential (primary) hypertension: Secondary | ICD-10-CM | POA: Diagnosis not present

## 2015-06-20 DIAGNOSIS — Z136 Encounter for screening for cardiovascular disorders: Secondary | ICD-10-CM | POA: Diagnosis not present

## 2015-06-20 DIAGNOSIS — Z1329 Encounter for screening for other suspected endocrine disorder: Secondary | ICD-10-CM

## 2015-06-20 DIAGNOSIS — R7309 Other abnormal glucose: Secondary | ICD-10-CM | POA: Diagnosis not present

## 2015-06-20 LAB — COMPREHENSIVE METABOLIC PANEL
ALBUMIN: 4.4 g/dL (ref 3.6–5.1)
ALK PHOS: 66 U/L (ref 33–130)
ALT: 16 U/L (ref 6–29)
AST: 15 U/L (ref 10–35)
BILIRUBIN TOTAL: 0.7 mg/dL (ref 0.2–1.2)
BUN: 19 mg/dL (ref 7–25)
CO2: 24 mmol/L (ref 20–31)
CREATININE: 0.92 mg/dL (ref 0.50–0.99)
Calcium: 9.8 mg/dL (ref 8.6–10.4)
Chloride: 101 mmol/L (ref 98–110)
Glucose, Bld: 82 mg/dL (ref 65–99)
Potassium: 3.8 mmol/L (ref 3.5–5.3)
SODIUM: 137 mmol/L (ref 135–146)
TOTAL PROTEIN: 6.7 g/dL (ref 6.1–8.1)

## 2015-06-20 LAB — LIPID PANEL
Cholesterol: 182 mg/dL (ref 125–200)
HDL: 72 mg/dL (ref >=46)
LDL Cholesterol: 81 mg/dL (ref <130)
TRIGLYCERIDES: 145 mg/dL (ref <150)
Total CHOL/HDL Ratio: 2.5 Ratio (ref <=5.0)
VLDL: 29 mg/dL (ref <30)

## 2015-06-20 LAB — POCT URINALYSIS DIP (MANUAL ENTRY)
Bilirubin, UA: NEGATIVE
GLUCOSE UA: NEGATIVE
Ketones, POC UA: NEGATIVE
Nitrite, UA: NEGATIVE
Protein Ur, POC: NEGATIVE
RBC UA: NEGATIVE
SPEC GRAV UA: 1.02
UROBILINOGEN UA: 0.2
pH, UA: 6

## 2015-06-20 LAB — HEMOGLOBIN A1C
HEMOGLOBIN A1C: 5.9 % — AB (ref <5.7)
Mean Plasma Glucose: 123 mg/dL

## 2015-06-20 MED ORDER — ZOSTER VACCINE LIVE 19400 UNT/0.65ML ~~LOC~~ SUSR: 0.6500 mL | Freq: Once | SUBCUTANEOUS | Status: DC

## 2015-06-20 NOTE — Patient Instructions (Signed)
Consider looking into the [facility] or [employer].  Or just try to cut out all complex carbohydrates for a week or two (no rice, flour, potatoes, pasta) - that is INCREDIBLY difficult but that combined with a different exercise regimen might be able to kick start a weight loss regimen for you.  Lori Mooney , Thank you for taking time to come for your Medicare Wellness Visit. I appreciate your ongoing commitment to your health goals. Please review the following plan we discussed and let me know if I can assist you in the future.   These are the goals we discussed: Goals    None      This is a list of the screening recommended for you and due dates:  Health Maintenance  Topic Date Due  .  Hepatitis C: One time screening is recommended by Center for Disease Control  (CDC) for  adults born from [birth year] through 1965.   15-Aug-1945  . Shingles Vaccine  11/04/2005  . Flu Shot  08/27/2015  . Pneumonia vaccines (2 of 2 - PPSV23) 10/30/2015  . Mammogram  05/01/2017  . Tetanus Vaccine  03/21/2019  . Colon Cancer Screening  06/09/2021  . DEXA scan (bone density measurement)  Completed   Diet for Metabolic Syndrome Metabolic syndrome is a disorder that includes at least three of these conditions:  Abdominal obesity.  Too much sugar in your blood.  High blood pressure.  Higher than normal amount of fat (lipids) in your blood.  Lower than normal level of "good" cholesterol (HDL). Following a healthy diet can help to keep metabolic syndrome under control. It can also help to prevent the development of conditions that are associated with metabolic syndrome, such as diabetes, heart disease, and stroke. Along with exercise, a healthy diet:  Helps to improve the way that the body uses insulin.  Promotes weight loss. A common goal for people with this condition is to lose at least 7 to 10 percent of their starting weight. WHAT DO I NEED TO KNOW ABOUT THIS DIET?  Use the glycemic index (GI) to plan  your meals. The index tells you how quickly a food will raise your blood sugar. Choose foods that have low GI values. These foods take a longer time to raise blood sugar.  Keep track of how many calories you take in. Eating the right amount of calories will help your achieve a healthy weight.  You may want to follow a Mediterranean diet. This diet includes lots of vegetables, lean meats or fish, whole grains, fruits, and healthy oils and fats. WHAT FOODS CAN I EAT? Grains Stone-ground whole wheat. Pumpernickel bread. Whole-grain bread, crackers, tortillas, cereal, and pasta. Unsweetened oatmeal.Bulgur.Barley.Quinoa.Brown rice or wild rice. Vegetables Lettuce. Spinach. Peas. Beets. Cauliflower. Cabbage. Broccoli. Carrots. Tomatoes. Squash. Eggplant. Herbs. Peppers. Onions. Cucumbers. Brussels sprouts. Sweet potatoes. Yams. Beans. Lentils. Fruits Berries. Apples. Oranges. Grapes. Mango. Pomegranate. Kiwi. Cherries. Meats and Other Protein Sources Seafood and shellfish. Lean meats.Poultry. Tofu. Dairy Low-fat or fat-free dairy products, such as milk, yogurt, and cheese. Beverages Water. Low-fat milk. Milk alternatives, like soy milk or almond milk. Real fruit juice. Condiments Low-sugar or sugar-free ketchup, barbecue sauce, and mayonnaise. Mustard. Relish. Fats and Oils Avocado. Canola or olive oil. Nuts and nut butters.Seeds. The items listed above may not be a complete list of recommended foods or beverages. Contact your dietitian for more options.  WHAT FOODS ARE NOT RECOMMENDED? Red meat. Palm oil and coconut oil. Processed foods. Fried foods. Alcohol. Sweetened  drinks, such as iced tea and soda. Sweets. Salty foods. The items listed above may not be a complete list of foods and beverages to avoid. Contact your dietitian for more information.   This information is not intended to replace advice given to you by your health care provider. Make sure you discuss any questions you have  with your health care provider.   Document Released: 05/29/2014 Document Reviewed: 05/29/2014 Elsevier Interactive Patient Education [employer].

## 2015-06-20 NOTE — Progress Notes (Signed)
Subjective:    Lori Mooney is a 70 y.o. female who presents for Medicare Annual/Subsequent preventive examination.  This is my first time meeting this patient as she was previously followed by my colleague Dr. Everlene Farrier.  Her last CPE was 04/19/14 with Dr. Everlene Farrier who noted below: Patient has had the flu shot this year. She has not yet had the pneumonia or shingles vaccine. Patient notes that she has never had the shingles. She will have her mammogram in a week and is up to date with her colonoscopy. Patient does not want a pap smear and last had one in 2015. Patient notes that she has been trying to cut out all her sweet and used to go to aerobics classes three days a week but has not recently. Patient has tried weight watchers but cannot lose more than 10 pounds on that particular program. Patient did not see Dr. Constance Holster for her vertigo after being referred to him from [facility] because she notes her symptoms went away. Patients mother is 22 and has a Psychologist, forensic. Her father 21, has arthritis and back problems. 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 Never Smoker   Smokeless tobacco  . Not on file     Problems Prior to Visit 1. a1c done - only eats protein and vegetable, low impact aerobics 3x/wk - 5-6 yrs Worried aobut her pulse being 90. worried about anaphylaxis Vitamin b12 pills D3 1000 -  Mother with angioplasty into Rt leg at 43. In [place].    BP 136/80 mmHg  Pulse 80  Temp(Src) 98.6 F (37 C) (Oral)  Resp 16  Ht 5\' 2"  (1.575 m)  Wt 198 lb (89.812 kg)  BMI 36.21 kg/m2   Current Problems (verified) Patient Active Problem List   Diagnosis Date Noted  . GERD (gastroesophageal reflux disease) 09/19/2013  . UTI (urinary tract infection) 03/02/2012  . Vertigo 03/02/2012  . Hypercholesterolemia 10/08/2010  . Benign hypertensive heart disease without heart failure 10/08/2010  . Obesity 10/08/2010     Medications Prior to Visit Current Outpatient Prescriptions on File Prior to Visit  Medication Sig Dispense Refill  . atorvastatin (LIPITOR) 20 MG tablet TAKE ONE TABLET BY MOUTH ONCE DAILY 90 tablet 2  . benzonatate (TESSALON) 100 MG capsule Take 1-2 capsules (100-200 mg total) by mouth 3 (three) times daily as needed for cough. 40 capsule 0  . Cholecalciferol (VITAMIN D-3) 1000 UNITS CAPS Take 1,000 Units by mouth daily.    . Cyanocobalamin (VITAMIN B12 PO) Take 1 tablet by mouth daily.    [name] ibuprofen (ADVIL,MOTRIN) 200 MG tablet Take 400 mg by mouth every 6 (six) hours as needed for pain.    [name] lisinopril-hydrochlorothiazide (PRINZIDE,ZESTORETIC) 20-12.5 MG tablet TAKE ONE TABLET BY MOUTH ONCE DAILY. 90 tablet 2  . Magnesium 250 MG TABS Take 250-500 mg by mouth at bedtime. 250 mg for sleep and 500 mg for constipation    . meclizine (ANTIVERT) 12.5 MG tablet Take 1 tablet (12.5 mg total) by mouth 3 (three) times daily as needed for dizziness. 30 tablet 0  . Ranitidine HCl (ZANTAC PO) Take 1 tablet by mouth daily as needed (Takes one tablet by mouth for GERD). For heartburn     No current facility-administered medications on file prior to visit.    Current Medications (verified) Current Outpatient Prescriptions  Medication Sig Dispense Refill  . atorvastatin (LIPITOR) 20 MG tablet TAKE ONE TABLET BY  MOUTH ONCE DAILY 90 tablet 2  . benzonatate (TESSALON) 100 MG capsule Take 1-2 capsules (100-200 mg total) by mouth 3 (three) times daily as needed for cough. 40 capsule 0  . Cholecalciferol (VITAMIN D-3) 1000 UNITS CAPS Take 1,000 Units by mouth daily.    . Cyanocobalamin (VITAMIN B12 PO) Take 1 tablet by mouth daily.    [name] ibuprofen (ADVIL,MOTRIN) 200 MG tablet Take 400 mg by mouth every 6 (six) hours as needed for pain.    [name] lisinopril-hydrochlorothiazide (PRINZIDE,ZESTORETIC) 20-12.5 MG tablet TAKE ONE TABLET BY MOUTH ONCE DAILY. 90 tablet 2  . Magnesium 250 MG TABS Take 250-500 mg by  mouth at bedtime. 250 mg for sleep and 500 mg for constipation    . meclizine (ANTIVERT) 12.5 MG tablet Take 1 tablet (12.5 mg total) by mouth 3 (three) times daily as needed for dizziness. 30 tablet 0  . Ranitidine HCl (ZANTAC PO) Take 1 tablet by mouth daily as needed (Takes one tablet by mouth for GERD). For heartburn     No current facility-administered medications for this visit.     Allergies (verified) Shellfish allergy and Strawberry extract   PAST HISTORY  Family History Family History  Problem Relation Age of Onset  . Hyperlipidemia Mother   . Heart disease Mother   . Hypertension Mother   . Hypertension Father     Social History Social History  Substance Use Topics  . Smoking status: Never Smoker   . Smokeless tobacco: Not on file  . Alcohol Use: No     Are there smokers in your home (other than you)? No  Risk Factors Current exercise habits: Home exercise routine includes walking 2-3 hrs per week.  Dietary issues discussed: heart healthy, small portions esp for dinner, healthy proteins and veggies. Husband does like desert  Cardiac risk factors: advanced age (older than 98 for men, 28 for women), obesity (BMI >= 30 kg/m2) and sedentary lifestyle.  Depression Screen Depression screen [facility] 2/9 06/20/2015 04/19/2014 02/26/2014  Decreased Interest 0 0 0  Down, Depressed, Hopeless 0 0 0  PHQ - 2 Score 0 0 0     Activities of Daily Living In your present state of health, do you have any difficulty performing the following activities?:  Driving? No Managing money?  No Feeding yourself? No Getting from bed to chair? No                                                             Climbing a flight of stairs? No Preparing food and eating?: No Bathing or showering? No Getting dressed: No Getting to the toilet? No Using the toilet:No Moving around from place to place: No In the past year have you fallen or had a near fall?:No   Are you  sexually active?  Yes  Do you have more than one partner?  No  Hearing Difficulties: No Do you often ask people to speak up or repeat themselves? No Do you experience ringing or noises in your ears? No   Do you feel that you have a problem with memory? No  Do you often misplace items? No  Do you feel safe at home?  Yes  Cognitive Testing  Alert? Yes  Normal Appearance?Yes  Oriented to person? Yes  Place? Yes  Time? Yes  Recall of three objects?  Yes  Can perform simple calculations? Yes  Displays appropriate judgment?Yes  Can read the correct time from a watch face?Yes   Advanced Directives have been discussed with the patient? No  List the Names of Other Physician/Practitioners you currently use: 1.  Dr. Mare Ferrari cardiology  Indicate any recent Medical Services you may have received from other than [facility] providers in the past year (date may be approximate).  Immunization History  Administered Date(s) Administered  . Influenza Whole 11/07/2011  . Influenza,inj,Quad PF,36+ Mos 10/28/2012  . Influenza-Unspecified 10/26/2013, 10/15/2014  . Pneumococcal Conjugate-13 10/30/2014  . Td 03/20/2009    Screening Tests Health Maintenance  Topic Date Due  . Hepatitis C Screening  Oct 31, 1945  . ZOSTAVAX  11/04/2005  . INFLUENZA VACCINE  08/27/2015  . PNA vac Low Risk Adult (2 of 2 - PPSV23) 10/30/2015  . MAMMOGRAM  05/01/2017  . TETANUS/TDAP  03/21/2019  . COLONOSCOPY  06/09/2021  . DEXA SCAN  Completed    All answers were reviewed with the patient and necessary referrals were made:  Stancil Deisher, MD   06/20/2015   History reviewed: allergies, current medications, past family history, past medical history, past social history, past surgical history and problem list  Review of Systems Pertinent items are noted in HPI.    Objective:   BP 136/80 mmHg  Pulse 80  Temp(Src) 98.6 F (37 C) (Oral)  Resp 16  Ht 5\' 2"  (1.575 m)  Wt 198 lb (89.812 kg)  BMI 36.21 kg/m2  Visual  Acuity Screening   Right eye Left eye Both eyes  Without correction: 20/20 20/20 20/20   With correction:       BP 136/80 mmHg  Pulse 80  Temp(Src) 98.6 F (37 C) (Oral)  Resp 16  Ht 5\' 2"  (1.575 m)  Wt 198 lb (89.812 kg)  BMI 36.21 kg/m2  General Appearance:    Alert, cooperative, no distress, appears stated age  Head:    Normocephalic, without obvious abnormality, atraumatic  Eyes:    PERRL, conjunctiva/corneas clear, EOM's intact, fundi    benign, both eyes  Ears:    Normal TM's and external ear canals, both ears  Nose:   Nares normal, septum midline, mucosa normal, no drainage    or sinus tenderness  Throat:   Lips, mucosa, and tongue normal; teeth and gums normal  Neck:   Supple, symmetrical, trachea midline, no adenopathy;    thyroid:  no enlargement/tenderness/nodules; no carotid   bruit or JVD  Back:     Symmetric, no curvature, ROM normal, no CVA tenderness  Lungs:     Clear to auscultation bilaterally, respirations unlabored  Chest Wall:    No tenderness or deformity   Heart:    Regular rate and rhythm, S1 and S2 normal, no murmur, rub   or gallop  Breast Exam:    No tenderness, masses, or nipple abnormality  Abdomen:     Soft, non-tender, bowel sounds active all four quadrants,    no masses, no organomegaly        Extremities:   Extremities normal, atraumatic, no cyanosis or edema  Pulses:   2+ and symmetric all extremities  Skin:   Skin color, texture, turgor normal, no rashes or lesions  Lymph nodes:   Cervical, supraclavicular, and axillary nodes normal  Neurologic:   CNII-XII intact, normal strength, sensation and reflexes    throughout       Assessment:     1. Medicare  annual wellness visit, subsequent   2. Routine screening for STI (sexually transmitted infection)   3. Screening for breast cancer   4. Screening for cardiovascular, respiratory, and genitourinary diseases   5. Screening for deficiency anemia   6. Screening for thyroid disorder   7.  Prediabetes   8. Hyperlipidemia   9. Essential hypertension, benign   10. Obesity   11. Vertigo          Plan:     During the course of the visit the patient was educated and counseled about appropriate screening and preventive services including:     Mam done 05/02/2015 Colonoscopy done 05/2011 by Dr. Cristina Gong at [place] was completely normal, no polyps, rec repeat in 5 years. DEXA done 10/2014 NORMAL Immunizations: Gets flu shot annually, had prevnar -13 10/16, tdap 2011 Needs hep c,  Needs shingles vaccine and pneumovax vaccine (check paperchart for latter)  Diet review for nutrition referral? Yes - pt would like more info on diet as she would really like to loose weight. Advised trial of more aggressive low carb diet since she has glucose intolerance and will need to increase exercise if she wants to see weight changes.   Patient Instructions (the written plan) was given to the patient.  Medicare Attestation I have personally reviewed: The patient's medical and social history Their use of alcohol, tobacco or illicit drugs Their current medications and supplements The patient's functional ability including ADLs,fall risks, home safety risks, cognitive, and hearing and visual impairment Diet and physical activities Evidence for depression or mood disorders  The patient's weight, height, BMI, and visual acuity have been recorded in the chart.  I have made referrals, counseling, and provided education to the patient based on review of the above and I have provided the patient with a written personalized care plan for preventive services.     Delman Cheadle, MD   06/20/2015

## 2015-06-21 LAB — CBC
HCT: 45.8 % — ABNORMAL HIGH (ref 35.0–45.0)
Hemoglobin: 15.3 g/dL (ref 11.7–15.5)
MCH: 30 pg (ref 27.0–33.0)
MCHC: 33.4 g/dL (ref 32.0–36.0)
MCV: 89.8 fL (ref 80.0–100.0)
MPV: 10 fL (ref 7.5–12.5)
PLATELETS: 320 10*3/uL (ref 140–400)
RBC: 5.1 MIL/uL (ref 3.80–5.10)
RDW: 14.6 % (ref 11.0–15.0)
WBC: 8.2 10*3/uL (ref 3.8–10.8)

## 2015-06-21 LAB — HEPATITIS C ANTIBODY: HCV Ab: NEGATIVE

## 2015-10-06 IMAGING — CR DG CHEST 2V
2 series · 2 of 2 positions shown · non-contrast
Comparison: PA and lateral chest 04/20/2014.

CLINICAL DATA: History of pneumonia.

EXAM:
CHEST  2 VIEW

[PA]
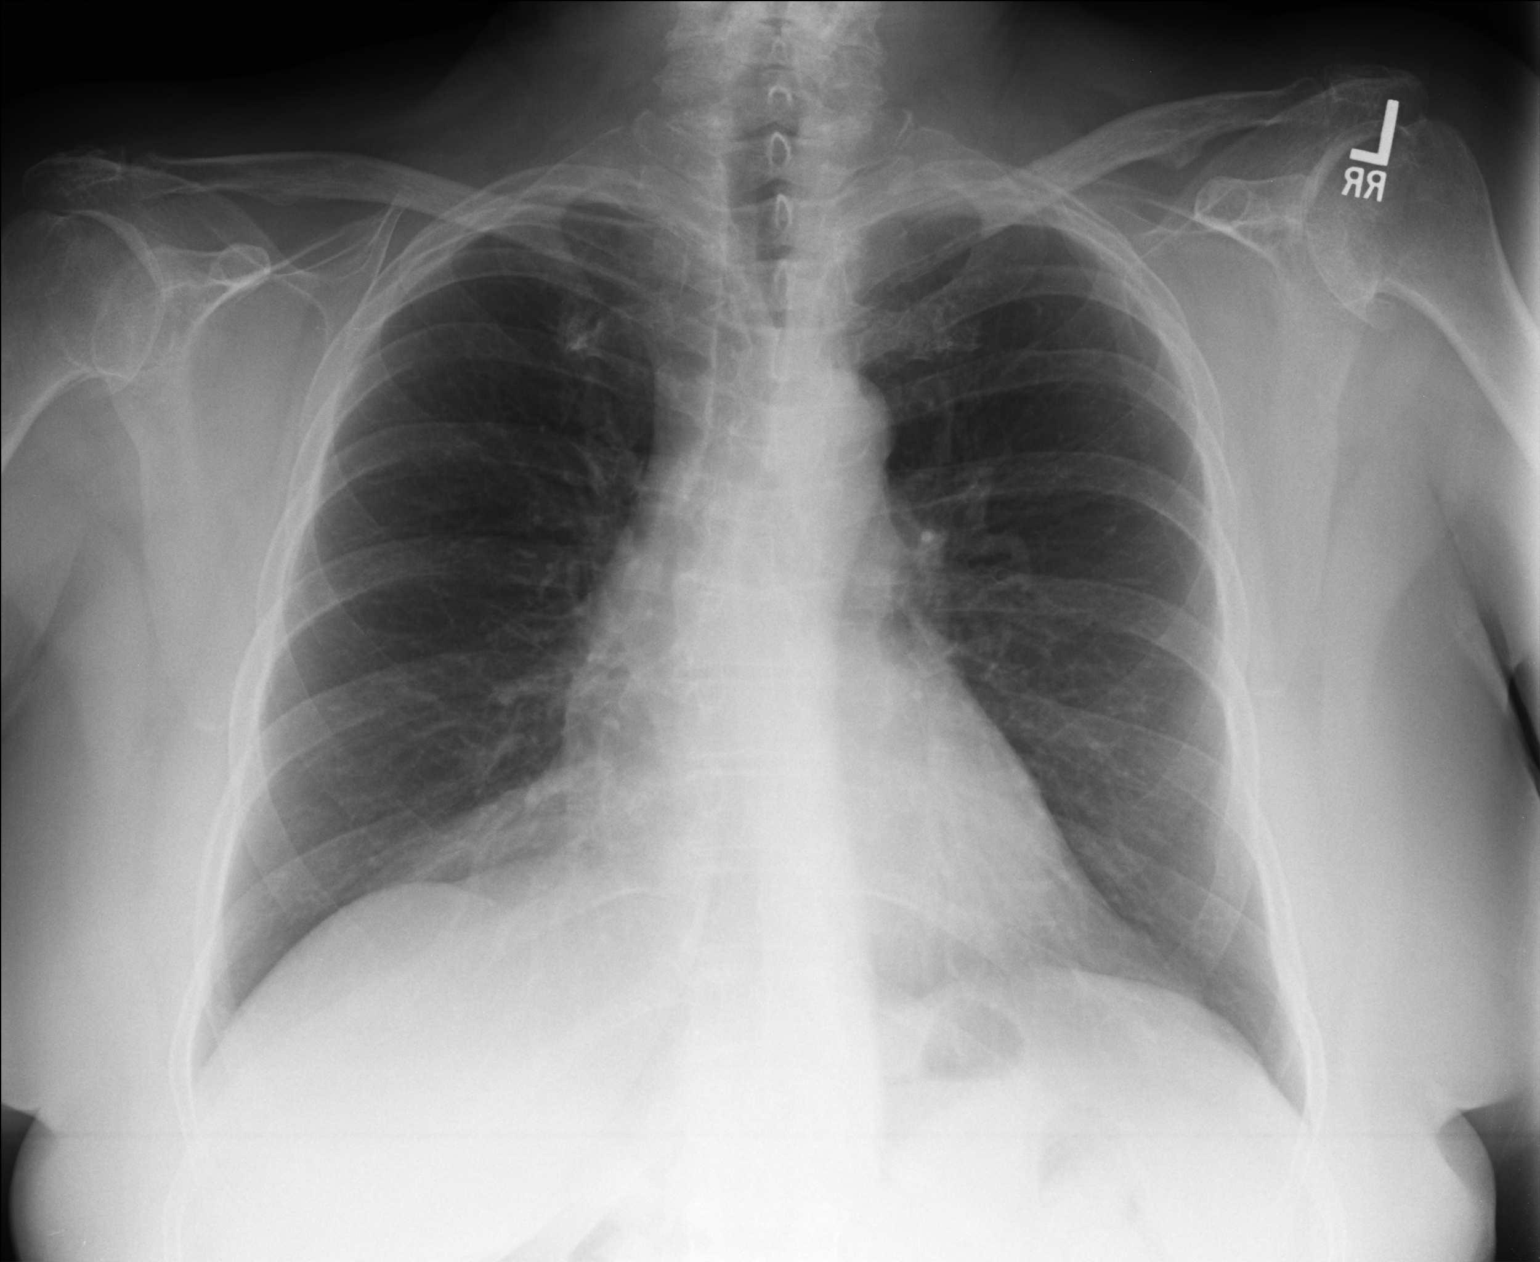

[lateral]
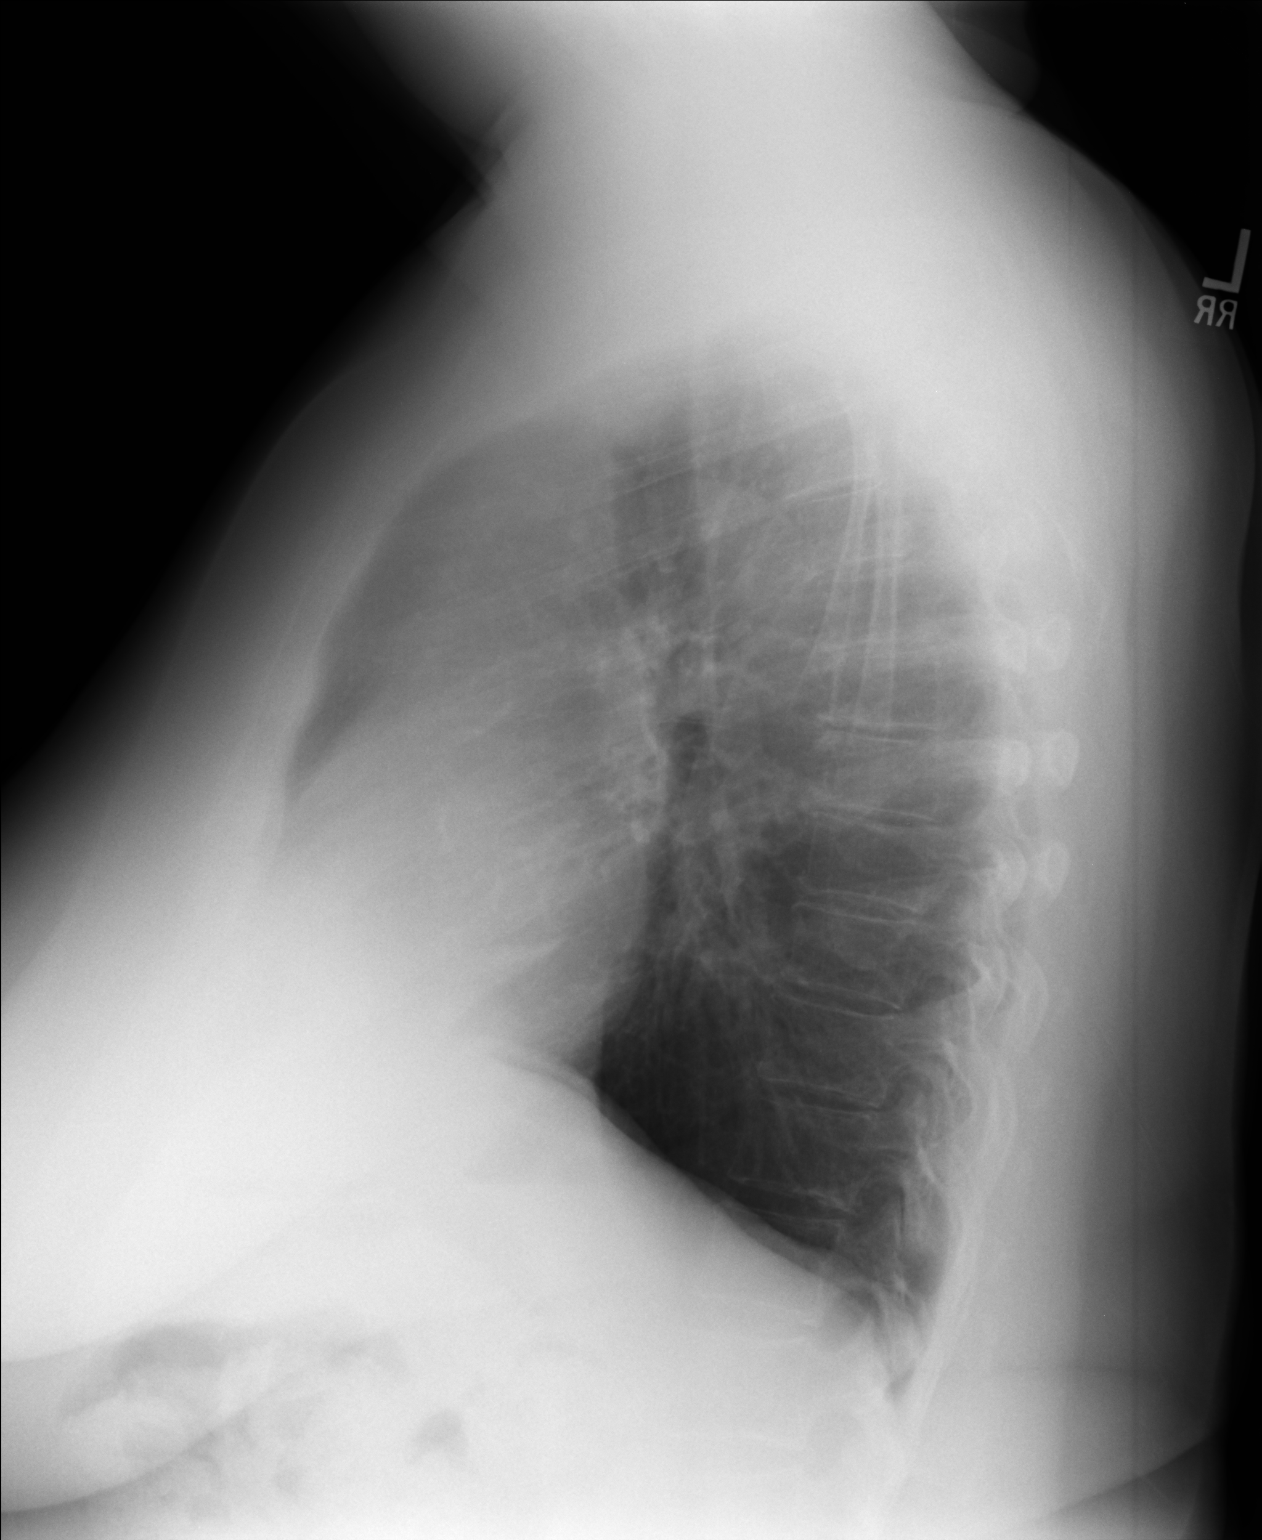

[2 of 2 positions shown; findings below may reference images not displayed]

FINDINGS: Prominent epicardial fat at the right cardiophrenic angle is
identified. No consolidative process, pneumothorax or effusion is
seen. Heart size is normal.
IMPRESSION: No acute disease.

## 2015-10-23 ENCOUNTER — Ambulatory Visit (INDEPENDENT_AMBULATORY_CARE_PROVIDER_SITE_OTHER): Payer: Medicare Other

## 2015-10-23 ENCOUNTER — Ambulatory Visit (INDEPENDENT_AMBULATORY_CARE_PROVIDER_SITE_OTHER): Payer: Medicare Other | Admitting: Physician Assistant

## 2015-10-23 VITALS — BP 118/78 | HR 97 | Temp 98.3°F | Resp 17 | Ht 62.0 in | Wt 198.0 lb

## 2015-10-23 DIAGNOSIS — R05 Cough: Secondary | ICD-10-CM | POA: Diagnosis not present

## 2015-10-23 DIAGNOSIS — D72829 Elevated white blood cell count, unspecified: Secondary | ICD-10-CM | POA: Diagnosis not present

## 2015-10-23 DIAGNOSIS — R509 Fever, unspecified: Secondary | ICD-10-CM

## 2015-10-23 DIAGNOSIS — R059 Cough, unspecified: Secondary | ICD-10-CM

## 2015-10-23 DIAGNOSIS — R0981 Nasal congestion: Secondary | ICD-10-CM

## 2015-10-23 LAB — POCT CBC
GRANULOCYTE PERCENT: 77.1 % (ref 37–80)
HCT, POC: 40.6 % (ref 37.7–47.9)
Hemoglobin: 14.2 g/dL (ref 12.2–16.2)
Lymph, poc: 2 (ref 0.6–3.4)
MCH: 30.1 pg (ref 27–31.2)
MCHC: 34.9 g/dL (ref 31.8–35.4)
MCV: 86.2 fL (ref 80–97)
MID (cbc): 1 — AB (ref 0–0.9)
MPV: 8.5 fL (ref 0–99.8)
PLATELET COUNT, POC: 282 10*3/uL (ref 142–424)
POC Granulocyte: 10.1 — AB (ref 2–6.9)
POC LYMPH PERCENT: 15.5 %L (ref 10–50)
POC MID %: 7.3 %M (ref 0–12)
RBC: 4.71 M/uL (ref 4.04–5.48)
RDW, POC: 12.9 %
WBC: 13.1 10*3/uL — AB (ref 4.6–10.2)

## 2015-10-23 LAB — POCT INFLUENZA A/B
INFLUENZA A, POC: NEGATIVE
INFLUENZA B, POC: NEGATIVE

## 2015-10-23 MED ORDER — HYDROCOD POLST-CPM POLST ER 10-8 MG/5ML PO SUER: 5.0000 mL | Freq: Two times a day (BID) | ORAL | 0 refills | Status: DC | PRN

## 2015-10-23 MED ORDER — BENZONATATE 100 MG PO CAPS: 100.0000 mg | ORAL_CAPSULE | Freq: Three times a day (TID) | ORAL | 0 refills | Status: DC | PRN

## 2015-10-23 MED ORDER — FLUTICASONE PROPIONATE 50 MCG/ACT NA SUSP: 2.0000 | Freq: Every day | NASAL | 0 refills | Status: DC

## 2015-10-23 MED ORDER — AMOXICILLIN-POT CLAVULANATE 875-125 MG PO TABS: 1.0000 | ORAL_TABLET | Freq: Two times a day (BID) | ORAL | 0 refills | Status: DC

## 2015-10-23 NOTE — Patient Instructions (Addendum)
-  Take antibiotics as prescibed - I recommend you rest, drink plenty of fluids, eat light meals including soups.  - You may use cough syrup at night for your cough and sore throat, Tessalon pearls during the day. Be aware that cough syrup can definitely make you drowsy and sleepy so do not drive or operate any heavy machinery if it is affecting you during the day.  - You may also use Tylenol or ibuprofen over-the-counter for your sore throat.  -Return to clinic if symptoms worsen, do not improve, or as needed     IF you received an x-ray today, you will receive an invoice from [facility]. Please contact [facility] at [phone] with questions or concerns regarding your invoice.   IF you received labwork today, you will receive an invoice from [employer]. Please contact [facility] at [phone] with questions or concerns regarding your invoice.   Our billing staff will not be able to assist you with questions regarding bills from these companies.  You will be contacted with the lab results as soon as they are available. The fastest way to get your results is to activate your My Chart account. Instructions are located on the last page of this paperwork. If you have not heard from [place] regarding the results in 2 weeks, please contact this office.

## 2015-10-23 NOTE — Progress Notes (Signed)
MRN: [id number] DOB: [birth year]  Subjective:   Lori Mooney is a 70 y.o. female presenting for chief complaint of Cough (Productive. Since saturday.) and Fever  Four days ago she had a head cold with nasal congestion which progressed to a productive cough three days ago. I started out as clear phlegm but now it is green.  Has associated rhinorrhea, ear fullness, sleep disturbance, fever (100 degrees), chills, fatigue, malaise and decreased appetite. Has tried mucinex and robitussin DM with mild relief. Denies sinus headache, sinus pain, itchy watery eyes, red eyes, sore throat, wheezing, shortness of breath, chest tightness, chest pain and myalgia, night sweats, nausea, vomiting, abdominal pain and diarrhea. 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 No recent travel.  Has history of seasonal allergies, No history of asthma. Patient had not had flu shot this season. Denies smoking. Denies any other aggravating or relieving factors, no other questions or concerns.  Has history of pneumonia. Last episode was in 2016.  Lori Mooney has a current medication list which includes the following prescription(s): atorvastatin, vitamin d-3, cyanocobalamin, ibuprofen, lisinopril-hydrochlorothiazide, loratadine, magnesium, meclizine, ranitidine hcl, zoster vaccine live (pf), amoxicillin-clavulanate, benzonatate, chlorpheniramine-hydrocodone, and fluticasone. Also is allergic to shellfish allergy and strawberry extract.  Lori Mooney  has a past medical history of Arthritis; Cataract; Exogenous obesity; Hyperlipidemia; and Hypertension. Also  has a past surgical history that includes Tubal ligation; Tonsillectomy; Cataract extraction w/ intraocular lens implant (Left); Dilatation & currettage/hysteroscopy with versapoint resection (N/A, 08/25/2012); and Eye surgery.  Objective:   Vitals: BP 118/78 (BP Location: Left Arm, Patient Position: Sitting, Cuff Size: Large)   Pulse 97   Temp  98.3 F (36.8 C) (Oral)   Resp 17   Ht 5\' 2"  (1.575 m)   Wt 198 lb (89.8 kg)   SpO2 96%   BMI 36.21 kg/m   Physical Exam  Constitutional: She is oriented to person, place, and time.  HENT:  Head: Normocephalic and atraumatic.  Right Ear: Tympanic membrane is injected.  Left Ear: Tympanic membrane is injected.  Nose: Mucosal edema and rhinorrhea present.  Mouth/Throat: Uvula is midline, oropharynx is clear and moist and mucous membranes are normal.  Eyes: Right conjunctiva is injected. Left conjunctiva is injected.  Neck: Normal range of motion.  Cardiovascular: Normal rate, regular rhythm, normal heart sounds and intact distal pulses.   Pulmonary/Chest: Effort normal.  Difficult to auscultate lungs due to patient persistently coughing with exhalation.      Lymphadenopathy:       Head (right side): No submental, no submandibular, no tonsillar, no preauricular, no posterior auricular and no occipital adenopathy present.       Head (left side): No submental, no submandibular, no tonsillar, no preauricular, no posterior auricular and no occipital adenopathy present.    She has no cervical adenopathy.       Right: No supraclavicular adenopathy present.       Left: No supraclavicular adenopathy present.  Neurological: She is alert and oriented to person, place, and time.  Skin: Skin is warm and dry.  Psychiatric: She has a normal mood and affect.  Vitals reviewed.   Results for orders placed or performed in visit on 10/23/15 (from the past 24 hour(s))  POCT CBC     Status: Abnormal   Collection Time: 10/23/15 10:40 AM  Result Value Ref Range   WBC 13.1 (A) 4.6 - 10.2 K/uL   Lymph, poc 2.0 0.6 - 3.4   POC LYMPH  PERCENT 15.5 10 - 50 %L   MID (cbc) 1.0 (A) 0 - 0.9   POC MID % 7.3 0 - 12 %M   POC Granulocyte 10.1 (A) 2 - 6.9   Granulocyte percent 77.1 37 - 80 %G   RBC 4.71 4.04 - 5.48 M/uL   Hemoglobin 14.2 12.2 - 16.2 g/dL   HCT, POC 40.6 37.7 - 47.9 %   MCV 86.2 80 - 97 fL     MCH, POC 30.1 27 - 31.2 pg   MCHC 34.9 31.8 - 35.4 g/dL   RDW, POC 12.9 %   Platelet Count, POC 282 142 - 424 K/uL   MPV 8.5 0 - 99.8 fL  POCT Influenza A/B     Status: None   Collection Time: 10/23/15 10:51 AM  Result Value Ref Range   Influenza A, POC Negative Negative   Influenza B, POC Negative Negative   Dg Chest 2 View  Result Date: 10/23/2015 CLINICAL DATA:  Cold, nasal congestion EXAM: CHEST  2 VIEW COMPARISON:  05/02/2014 FINDINGS: Cardiomediastinal silhouette is unremarkable. Mild elevation of the right hemidiaphragm. No acute infiltrate or pleural effusion. No pulmonary edema. Degenerative changes mid thoracic spine. Degenerative changes bilateral shoulders. IMPRESSION: No active cardiopulmonary disease. Electronically Signed   By: Lahoma Crocker M.D.   On: 10/23/2015 11:05     Assessment and Plan :  1. Cough - DG Chest 2 View; Future - benzonatate (TESSALON) 100 MG capsule; Take 1-2 capsules (100-200 mg total) by mouth 3 (three) times daily as needed for cough.  Dispense: 40 capsule; Refill: 0 - chlorpheniramine-HYDROcodone (TUSSIONEX PENNKINETIC ER) 10-8 MG/5ML SUER; Take 5 mLs by mouth every 12 (twelve) hours as needed for cough.  Dispense: 100 mL; Refill: 0  2. Fever, unspecified -Resolved in office - POCT CBC - DG Chest 2 View; Future - POCT Influenza A/B  3. Leukocytosis -Will cover for bacterial respiratory infection due to symptoms, productive cough, and leukocytosis - amoxicillin-clavulanate (AUGMENTIN) 875-125 MG tablet; Take 1 tablet by mouth 2 (two) times daily.  Dispense: 20 tablet; Refill: 0  4. Nasal congestion - fluticasone (FLONASE) 50 MCG/ACT nasal spray; Place 2 sprays into both nostrils daily.  Dispense: 16 g; Refill: 0  -Return to clinic if symptoms worsen, do not improve, or as needed  Tenna Delaine, PA-C  [facility] 10/23/2015 11:21 AM

## 2015-11-10 DIAGNOSIS — Z23 Encounter for immunization: Secondary | ICD-10-CM | POA: Diagnosis not present

## 2015-12-16 ENCOUNTER — Ambulatory Visit (INDEPENDENT_AMBULATORY_CARE_PROVIDER_SITE_OTHER): Payer: Medicare Other | Admitting: Cardiology

## 2015-12-16 ENCOUNTER — Encounter: Payer: Self-pay | Admitting: Cardiology

## 2015-12-16 VITALS — BP 136/78 | HR 96 | Ht 63.0 in | Wt 195.8 lb

## 2015-12-16 DIAGNOSIS — R7309 Other abnormal glucose: Secondary | ICD-10-CM

## 2015-12-16 DIAGNOSIS — E6609 Other obesity due to excess calories: Secondary | ICD-10-CM | POA: Diagnosis not present

## 2015-12-16 DIAGNOSIS — I119 Hypertensive heart disease without heart failure: Secondary | ICD-10-CM

## 2015-12-16 DIAGNOSIS — E7800 Pure hypercholesterolemia, unspecified: Secondary | ICD-10-CM | POA: Diagnosis not present

## 2015-12-16 LAB — COMPREHENSIVE METABOLIC PANEL
ALT: 18 U/L (ref 6–29)
AST: 17 U/L (ref 10–35)
Albumin: 4.4 g/dL (ref 3.6–5.1)
Alkaline Phosphatase: 66 U/L (ref 33–130)
BUN: 17 mg/dL (ref 7–25)
CHLORIDE: 104 mmol/L (ref 98–110)
CO2: 24 mmol/L (ref 20–31)
CREATININE: 0.9 mg/dL (ref 0.60–0.93)
Calcium: 9.7 mg/dL (ref 8.6–10.4)
GLUCOSE: 93 mg/dL (ref 65–99)
Potassium: 4.2 mmol/L (ref 3.5–5.3)
SODIUM: 139 mmol/L (ref 135–146)
Total Bilirubin: 0.6 mg/dL (ref 0.2–1.2)
Total Protein: 6.7 g/dL (ref 6.1–8.1)

## 2015-12-16 LAB — LIPID PANEL
Cholesterol: 171 mg/dL (ref <200)
HDL: 59 mg/dL (ref >50)
LDL CALC: 76 mg/dL (ref <100)
TRIGLYCERIDES: 182 mg/dL — AB (ref <150)
Total CHOL/HDL Ratio: 2.9 Ratio (ref <5.0)
VLDL: 36 mg/dL — AB (ref <30)

## 2015-12-16 MED ORDER — ATORVASTATIN CALCIUM 20 MG PO TABS: 20.0000 mg | ORAL_TABLET | Freq: Every day | ORAL | 3 refills | Status: DC

## 2015-12-16 MED ORDER — LISINOPRIL-HYDROCHLOROTHIAZIDE 20-12.5 MG PO TABS: 1.0000 | ORAL_TABLET | Freq: Every day | ORAL | 3 refills | Status: DC

## 2015-12-16 NOTE — Progress Notes (Signed)
Cardiology Office Note    Date:  12/16/2015   ID:  Lori Mooney, DOB [birth year], MRN [id number]  PCP:  Delman Cheadle, MD  Cardiologist:   Candee Furbish, MD     History of Present Illness:  Lori Mooney is a 70 y.o. female former patient of Dr. Mare Ferrari here for follow up. Has HTN, HL, obesity. [facility]. Prior palpitiations during liquid diet.   Overall she is doing quite well. She did have some questions about vitamin levels, hemoglobin A1c, lab work which we reviewed. She has established with Dr. Brigitte Pulse as her primary care doctor.  Denies any chest pain, shortness of breath, syncope, bleeding. Her husband is also a heart care patient but [facility].   Past Medical History:  Diagnosis Date  . 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 Cataract   . Exogenous obesity   . Hyperlipidemia    hypercholesterolemia  . Hypertension     Past Surgical History:  Procedure Laterality Date  . CATARACT EXTRACTION W/ INTRAOCULAR LENS IMPLANT Left   . DILITATION & CURRETTAGE/HYSTROSCOPY WITH VERSAPOINT RESECTION N/A 08/25/2012   Procedure: DILATATION & CURETTAGE/HYSTEROSCOPY WITH VERSAPOINT RESECTION;  Surgeon: Princess Bruins, MD;  Location: [facility];  Service: Gynecology;  Laterality: N/A;  . EYE SURGERY    . TONSILLECTOMY    . TUBAL LIGATION      Current Medications: Outpatient Medications Prior to Visit  Medication Sig Dispense Refill  . Cholecalciferol (VITAMIN D-3) 1000 UNITS CAPS Take 1,000 Units by mouth daily.    . Cyanocobalamin (VITAMIN B12 PO) Take 1 tablet by mouth daily. Reported on 06/20/2015    . ibuprofen (ADVIL,MOTRIN) 200 MG tablet Take 400 mg by mouth every 6 (six) hours as needed for pain.    [name] loratadine (CLARITIN) 10 MG tablet Take 10 mg by mouth daily.    . Magnesium 250 MG TABS Take 250-500 mg by mouth at bedtime. 250 mg for sleep and 500 mg for constipation    . meclizine (ANTIVERT) 12.5  MG tablet Take 1 tablet (12.5 mg total) by mouth 3 (three) times daily as needed for dizziness. 30 tablet 0  . Ranitidine HCl (ZANTAC PO) Take 1 tablet by mouth daily as needed (Takes one tablet by mouth for GERD). For heartburn    . atorvastatin (LIPITOR) 20 MG tablet TAKE ONE TABLET BY MOUTH ONCE DAILY 90 tablet 2  . lisinopril-hydrochlorothiazide (PRINZIDE,ZESTORETIC) 20-12.5 MG tablet TAKE ONE TABLET BY MOUTH ONCE DAILY. 90 tablet 2  . amoxicillin-clavulanate (AUGMENTIN) 875-125 MG tablet Take 1 tablet by mouth 2 (two) times daily. (Patient not taking: Reported on 12/16/2015) 20 tablet 0  . benzonatate (TESSALON) 100 MG capsule Take 1-2 capsules (100-200 mg total) by mouth 3 (three) times daily as needed for cough. (Patient not taking: Reported on 12/16/2015) 40 capsule 0  . chlorpheniramine-HYDROcodone (TUSSIONEX PENNKINETIC ER) 10-8 MG/5ML SUER Take 5 mLs by mouth every 12 (twelve) hours as needed for cough. (Patient not taking: Reported on 12/16/2015) 100 mL 0  . fluticasone (FLONASE) 50 MCG/ACT nasal spray Place 2 sprays into both nostrils daily. (Patient not taking: Reported on 12/16/2015) 16 g 0  . Zoster Vaccine Live, PF, (ZOSTAVAX) 29562 UNT/0.65ML injection Inject 19,400 Units into the skin once. (Patient not taking: Reported on 12/16/2015) 1 vial 0   No facility-administered medications prior to visit.      Allergies:   Shellfish allergy and  Strawberry extract   Social History   Social History  . Marital status: Married    Spouse name: N/A  . Number of children: N/A  . Years of education: N/A   Occupational History  . Retired Therapist, sports    Social History Main Topics  . Smoking status: Never Smoker  . Smokeless tobacco: Never Used  . Alcohol use No  . Drug use: No  . Sexual activity: Yes   Other Topics Concern  . None   Social History Narrative   Married. Education: [employer].     Family History:  The patient's family history includes Heart disease in her mother;  Hyperlipidemia in her mother; Hypertension in her father and mother.   ROS:   Please see the history of present illness.    ROS All other systems reviewed and are negative.   PHYSICAL EXAM:   VS:  BP 136/78   Pulse 96   Ht 5\' 3"  (1.6 m)   Wt 195 lb 12.8 oz (88.8 kg)   SpO2 96%   BMI 34.68 kg/m    GEN: Well nourished, well developed, in no acute distress  HEENT: normal  Neck: no JVD, carotid bruits, or masses Cardiac: RRR; no murmurs, rubs, or gallops,no edema  Respiratory:  clear to auscultation bilaterally, normal work of breathing GI: soft, nontender, nondistended, + BS MS: no deformity or atrophy  Skin: warm and dry, no rash Neuro:  Alert and Oriented x 3, Strength and sensation are intact Psych: euthymic mood, full affect  Wt Readings from Last 3 Encounters:  12/16/15 195 lb 12.8 oz (88.8 kg)  10/23/15 198 lb (89.8 kg)  06/20/15 198 lb (89.8 kg)      Studies/Labs Reviewed:   EKG:  EKG ordered today 12/16/15 shows sinus rhythm 85 of septal infarct pattern, left axis deviation no other abnormalities. Personally viewed. No change from prior  Recent Labs: 06/20/2015: ALT 16; BUN 19; Creat 0.92; Platelets 320; Potassium 3.8; Sodium 137 10/23/2015: Hemoglobin 14.2   Lipid Panel    Component Value Date/Time   CHOL 182 06/20/2015 1221   TRIG 145 06/20/2015 1221   HDL 72 06/20/2015 1221   CHOLHDL 2.5 06/20/2015 1221   VLDL 29 06/20/2015 1221   LDLCALC 81 06/20/2015 1221   LDLDIRECT 95.2 09/06/2012 0814    Additional studies/ records that were reviewed today include:  Prior office notes reviewed, lab work reviewed    ASSESSMENT:    1. Benign hypertensive heart disease without heart failure   2. Pure hypercholesterolemia   3. Class 1 obesity due to excess calories without serious comorbidity in adult, unspecified BMI   4. Elevated hemoglobin A1c      PLAN:  In order of problems listed above:  Hypertension, essential  - Overall very well controlled.  -  Medications reviewed.  Hyperlipidemia  - 20 mg atorvastatin. Monitored by Dr. Brigitte Pulse.  Obesity  - Continue to encourage weight loss. Exercise.  We printed out prescriptions for her. She would like [place] to check lab work today and we will order a complete metabolic profile, hemoglobin A1c, lipid panel  Moving forward, we can see her on an as-needed basis since she has established with a primary care doctor, Dr. Brigitte Pulse. She can continue to manage her hypertension, hyperlipidemia. We will be happy to see her if needed.  Medication Adjustments/Labs and Tests Ordered: Current medicines are reviewed at length with the patient today.  Concerns regarding medicines are outlined above.  Medication changes, Labs and Tests ordered  today are listed in the Patient Instructions below. Patient Instructions  Medication Instructions:  The current medical regimen is effective;  continue present plan and medications.  Labwork: Please have blood work today. (CMP, LIPID AND HA1C)  Follow-Up: Follow up as needed with Dr Marlou Porch.  Thank you for choosing [facility]!!        Signed, Candee Furbish, MD  12/16/2015 8:53 AM    [address], [place], [place]  [address] Phone: [phone]; Fax: [phone]

## 2015-12-16 NOTE — Patient Instructions (Signed)
Medication Instructions:  The current medical regimen is effective;  continue present plan and medications.  Labwork: Please have blood work today. (CMP, LIPID AND HA1C)  Follow-Up: Follow up as needed with Dr Marlou Porch.  Thank you for choosing [facility]!!

## 2015-12-16 NOTE — Addendum Note (Signed)
Addended by: Bobby Rumpf C on: 12/16/2015 02:06 PM   Modules accepted: Orders

## 2015-12-17 LAB — HEMOGLOBIN A1C
Hgb A1c MFr Bld: 5.7 % — ABNORMAL HIGH (ref <5.7)
MEAN PLASMA GLUCOSE: 117 mg/dL

## 2016-02-21 DIAGNOSIS — L918 Other hypertrophic disorders of the skin: Secondary | ICD-10-CM | POA: Diagnosis not present

## 2016-02-21 DIAGNOSIS — L720 Epidermal cyst: Secondary | ICD-10-CM | POA: Diagnosis not present

## 2016-02-21 DIAGNOSIS — L853 Xerosis cutis: Secondary | ICD-10-CM | POA: Diagnosis not present

## 2016-02-21 DIAGNOSIS — D1801 Hemangioma of skin and subcutaneous tissue: Secondary | ICD-10-CM | POA: Diagnosis not present

## 2016-02-21 DIAGNOSIS — D2261 Melanocytic nevi of right upper limb, including shoulder: Secondary | ICD-10-CM | POA: Diagnosis not present

## 2016-02-21 DIAGNOSIS — L821 Other seborrheic keratosis: Secondary | ICD-10-CM | POA: Diagnosis not present

## 2016-02-21 DIAGNOSIS — L738 Other specified follicular disorders: Secondary | ICD-10-CM | POA: Diagnosis not present

## 2016-02-21 DIAGNOSIS — L570 Actinic keratosis: Secondary | ICD-10-CM | POA: Diagnosis not present

## 2016-02-21 DIAGNOSIS — Z85828 Personal history of other malignant neoplasm of skin: Secondary | ICD-10-CM | POA: Diagnosis not present

## 2016-02-21 DIAGNOSIS — L814 Other melanin hyperpigmentation: Secondary | ICD-10-CM | POA: Diagnosis not present

## 2016-03-19 DIAGNOSIS — Z961 Presence of intraocular lens: Secondary | ICD-10-CM | POA: Diagnosis not present

## 2016-03-19 DIAGNOSIS — H2511 Age-related nuclear cataract, right eye: Secondary | ICD-10-CM | POA: Diagnosis not present

## 2016-03-19 DIAGNOSIS — H524 Presbyopia: Secondary | ICD-10-CM | POA: Diagnosis not present

## 2016-03-19 DIAGNOSIS — H5213 Myopia, bilateral: Secondary | ICD-10-CM | POA: Diagnosis not present

## 2016-03-19 DIAGNOSIS — H52203 Unspecified astigmatism, bilateral: Secondary | ICD-10-CM | POA: Diagnosis not present

## 2016-05-06 DIAGNOSIS — M19012 Primary osteoarthritis, left shoulder: Secondary | ICD-10-CM | POA: Diagnosis not present

## 2016-06-09 ENCOUNTER — Other Ambulatory Visit: Payer: Self-pay | Admitting: Family Medicine

## 2016-06-09 ENCOUNTER — Other Ambulatory Visit: Payer: Self-pay | Admitting: Obstetrics & Gynecology

## 2016-06-09 DIAGNOSIS — Z1231 Encounter for screening mammogram for malignant neoplasm of breast: Secondary | ICD-10-CM

## 2016-06-18 ENCOUNTER — Ambulatory Visit
Admission: RE | Admit: 2016-06-18 | Discharge: 2016-06-18 | Disposition: A | Discharge status: Home / Self Care | Payer: Medicare Other | Source: Ambulatory Visit | Attending: Family Medicine | Admitting: Family Medicine

## 2016-06-18 DIAGNOSIS — Z1231 Encounter for screening mammogram for malignant neoplasm of breast: Secondary | ICD-10-CM

## 2016-07-20 ENCOUNTER — Ambulatory Visit (INDEPENDENT_AMBULATORY_CARE_PROVIDER_SITE_OTHER): Payer: Medicare Other | Admitting: Family Medicine

## 2016-07-20 ENCOUNTER — Encounter: Payer: Self-pay | Admitting: Family Medicine

## 2016-07-20 VITALS — BP 120/80 | HR 94 | Temp 98.1°F | Resp 18 | Ht 62.25 in | Wt 197.8 lb

## 2016-07-20 DIAGNOSIS — R350 Frequency of micturition: Secondary | ICD-10-CM

## 2016-07-20 DIAGNOSIS — Z8349 Family history of other endocrine, nutritional and metabolic diseases: Secondary | ICD-10-CM | POA: Diagnosis not present

## 2016-07-20 DIAGNOSIS — J301 Allergic rhinitis due to pollen: Secondary | ICD-10-CM

## 2016-07-20 DIAGNOSIS — I10 Essential (primary) hypertension: Secondary | ICD-10-CM | POA: Diagnosis not present

## 2016-07-20 DIAGNOSIS — M545 Low back pain, unspecified: Secondary | ICD-10-CM

## 2016-07-20 DIAGNOSIS — E8881 Metabolic syndrome: Secondary | ICD-10-CM

## 2016-07-20 DIAGNOSIS — Z113 Encounter for screening for infections with a predominantly sexual mode of transmission: Secondary | ICD-10-CM | POA: Diagnosis not present

## 2016-07-20 DIAGNOSIS — Z5181 Encounter for therapeutic drug level monitoring: Secondary | ICD-10-CM | POA: Diagnosis not present

## 2016-07-20 DIAGNOSIS — Z124 Encounter for screening for malignant neoplasm of cervix: Secondary | ICD-10-CM

## 2016-07-20 DIAGNOSIS — E6609 Other obesity due to excess calories: Secondary | ICD-10-CM

## 2016-07-20 DIAGNOSIS — Z23 Encounter for immunization: Secondary | ICD-10-CM | POA: Diagnosis not present

## 2016-07-20 DIAGNOSIS — E782 Mixed hyperlipidemia: Secondary | ICD-10-CM

## 2016-07-20 LAB — POCT URINALYSIS DIP (MANUAL ENTRY)
BILIRUBIN UA: NEGATIVE
Blood, UA: NEGATIVE
Glucose, UA: NEGATIVE mg/dL
Ketones, POC UA: NEGATIVE mg/dL
NITRITE UA: NEGATIVE
PH UA: 7 (ref 5.0–8.0)
Protein Ur, POC: NEGATIVE mg/dL
Spec Grav, UA: 1.015 (ref 1.010–1.025)
Urobilinogen, UA: 0.2 E.U./dL

## 2016-07-20 MED ORDER — FLUTICASONE PROPIONATE 50 MCG/ACT NA SUSP: 2.0000 | Freq: Every day | NASAL | 5 refills | Status: DC

## 2016-07-20 MED ORDER — LISINOPRIL-HYDROCHLOROTHIAZIDE 20-12.5 MG PO TABS: 1.0000 | ORAL_TABLET | Freq: Every day | ORAL | 3 refills | Status: DC

## 2016-07-20 NOTE — Progress Notes (Addendum)
Subjective:   Chief Complaint  Patient presents with  . Annual Exam    with PAP smear     Lori Mooney is a 71 y.o. female who presents for Medicare Annual/Subsequent preventive examination.  I've seen her once last year for the same. she was previously followed by my colleague Dr. Everlene Farrier.    Last pap smear 2015 (pt would have been 71 yo).  Patients mother is 69 and has a Psychologist, forensic. Her father 69, has arthritis and back problems. 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 Never Smoker  Smokeless Tobacco  . Never Used     Problems Prior to Visit 1. Eats protein and vegetable but has not been trying as much, does not eat large portions but does crave sweets and crackers which causes trouble, low impact aerobics 3x/wk - 6-7 yrs Lab Results  Component Value Date   HGBA1C 5.7 (H) 12/16/2015    Vitamin b12 pills D3 1000 -  Mother with angioplasty into Rt leg at 91. In [place].   Left shoulder impinged and does cortisone injection every few years.   BP 119/85   Pulse 94   Temp 98.1 F (36.7 C) (Oral)   Resp 18   Ht 5' 2.25" (1.581 m)   Wt 197 lb 12.8 oz (89.7 kg)   SpO2 95%   BMI 35.89 kg/m   Current Problems (verified) Patient Active Problem List   Diagnosis Date Noted  . GERD (gastroesophageal reflux disease) 09/19/2013  . Vertigo 03/02/2012  . Hypercholesterolemia 10/08/2010  . Benign hypertensive heart disease without heart failure 10/08/2010  . Obesity 10/08/2010    Medications Prior to Visit Current Outpatient Prescriptions on File Prior to Visit  Medication Sig Dispense Refill  . atorvastatin (LIPITOR) 20 MG tablet Take 1 tablet (20 mg total) by mouth daily. 90 tablet 3  . Cholecalciferol (VITAMIN D-3) 1000 UNITS CAPS Take 1,000 Units by mouth daily.    . Cyanocobalamin (VITAMIN B12 PO) Take 1 tablet by mouth daily. Reported on 06/20/2015    . ibuprofen (ADVIL,MOTRIN) 200 MG tablet  Take 400 mg by mouth every 6 (six) hours as needed for pain.    [name] lisinopril-hydrochlorothiazide (PRINZIDE,ZESTORETIC) 20-12.5 MG tablet Take 1 tablet by mouth daily. 90 tablet 3  . loratadine (CLARITIN) 10 MG tablet Take 10 mg by mouth daily.    . Magnesium 250 MG TABS Take 250-500 mg by mouth at bedtime. 250 mg for sleep and 500 mg for constipation    . meclizine (ANTIVERT) 12.5 MG tablet Take 1 tablet (12.5 mg total) by mouth 3 (three) times daily as needed for dizziness. 30 tablet 0  . meloxicam (MOBIC) 15 MG tablet Take 15 mg by mouth daily.    . Ranitidine HCl (ZANTAC PO) Take 1 tablet by mouth daily as needed (Takes one tablet by mouth for GERD). For heartburn     No current facility-administered medications on file prior to visit.     Current Medications (verified) Current Outpatient Prescriptions  Medication Sig Dispense Refill  . atorvastatin (LIPITOR) 20 MG tablet Take 1 tablet (20 mg total) by mouth daily. 90 tablet 3  . Cholecalciferol (VITAMIN D-3) 1000 UNITS CAPS Take 1,000 Units by mouth daily.    . Cyanocobalamin (VITAMIN B12 PO) Take 1 tablet by mouth daily. Reported on 06/20/2015    . ibuprofen (ADVIL,MOTRIN) 200 MG tablet Take 400 mg by mouth every 6 (six) hours  as needed for pain.    [name] lisinopril-hydrochlorothiazide (PRINZIDE,ZESTORETIC) 20-12.5 MG tablet Take 1 tablet by mouth daily. 90 tablet 3  . loratadine (CLARITIN) 10 MG tablet Take 10 mg by mouth daily.    . Magnesium 250 MG TABS Take 250-500 mg by mouth at bedtime. 250 mg for sleep and 500 mg for constipation    . meclizine (ANTIVERT) 12.5 MG tablet Take 1 tablet (12.5 mg total) by mouth 3 (three) times daily as needed for dizziness. 30 tablet 0  . meloxicam (MOBIC) 15 MG tablet Take 15 mg by mouth daily.    . Ranitidine HCl (ZANTAC PO) Take 1 tablet by mouth daily as needed (Takes one tablet by mouth for GERD). For heartburn     No current facility-administered medications for this visit.      Allergies  (verified) Shellfish allergy and Strawberry extract   PAST HISTORY  Family History Family History  Problem Relation Age of Onset  . Hyperlipidemia Mother   . Heart disease Mother   . Hypertension Mother   . Hypertension Father     Social History Social History  Substance Use Topics  . Smoking status: Never Smoker  . Smokeless tobacco: Never Used  . Alcohol use No     Are there smokers in your home (other than you)? No  Risk Factors Current exercise habits: Home exercise routine includes walking 2-3 hrs per week. [facility] center.  Dietary issues discussed: heart healthy, small portions esp for dinner, healthy proteins and veggies. Husband does like desert. Prior palpitiations during liquid diet.   Cardiac risk factors: advanced age (older than 63 for men, 74 for women), obesity (BMI >= 30 kg/m2) and sedentary lifestyle.  Depression Screen Depression screen [facility] 2/9 07/20/2016 10/23/2015 06/20/2015 04/19/2014 02/26/2014  Decreased Interest 0 0 0 0 0  Down, Depressed, Hopeless 0 0 0 0 0  PHQ - 2 Score 0 0 0 0 0     Activities of Daily Living In your present state of health, do you have any difficulty performing the following activities?:  Driving? No Managing money?  No Feeding yourself? No Getting from bed to chair? No                                                             Climbing a flight of stairs? No Preparing food and eating?: No Bathing or showering? No Getting dressed: No Getting to the toilet? No Using the toilet:No Moving around from place to place: No In the past year have you fallen or had a near fall?:No   Are you sexually active?  Yes  Do you have more than one partner?  No  Hearing Difficulties: No Do you often ask people to speak up or repeat themselves? No Do you experience ringing or noises in your ears? No   Do you feel that you have a problem with memory? No  Do you often misplace items? No  Do you  feel safe at home?  Yes  Cognitive Testing  Alert? Yes  Normal Appearance?Yes  Oriented to person? Yes  Place? Yes   Time? Yes  Recall of three objects?  Yes  Can perform simple calculations? Yes  Displays appropriate judgment?Yes  Can read the correct time from a watch  face?Yes   Advanced Directives have been discussed with the patient? No  List the Names of Other Physician/Practitioners you currently use: 1.  Dr. Rutherford Guys, optho 2.  Dentist 3.   GI - Dr. Cristina Gong 4    Derm - in "[facility]" [facility] - [place], [place] [address] 5.  Cardiology - Dr. Candee Furbish    Indicate any recent Medical Services you may have received from other than [facility] providers in the past year (date may be approximate).  Immunization History  Administered Date(s) Administered  . Influenza Whole 11/07/2011  . Influenza,inj,Quad PF,36+ Mos 10/28/2012  . Influenza-Unspecified 10/26/2013, 10/15/2014  . Pneumococcal Conjugate-13 10/30/2014  . Td 03/20/2009    Screening Tests Health Maintenance  Topic Date Due  . PNA vac Low Risk Adult (2 of 2 - PPSV23) 10/30/2015  . INFLUENZA VACCINE  08/26/2016  . MAMMOGRAM  06/19/2018  . TETANUS/TDAP  03/21/2019  . COLONOSCOPY  06/09/2021  . DEXA SCAN  Completed  . Hepatitis C Screening  Completed    All answers were reviewed with the patient and necessary referrals were made:  Jasira Robinson, MD   07/20/2016   History reviewed: allergies, current medications, past family history, past medical history, past social history, past surgical history and problem list  Review of Systems Pertinent items are noted in HPI.    Objective:   BP 119/85   Pulse 94   Temp 98.1 F (36.7 C) (Oral)   Resp 18   Ht 5' 2.25" (1.581 m)   Wt 197 lb 12.8 oz (89.7 kg)   SpO2 95%   BMI 35.89 kg/m   Visual Acuity Screening   Right eye Left eye Both eyes  Without correction: 20/40 20/40-2 20/30  With correction:     Had a cataract removed from Left eye and used  reading glasses.  Reports "eyes aren't focusing today." likely due to seasonal allergies - did not take antihistamine this a.m.   General Appearance:    Alert, cooperative, no distress, appears stated age  Head:    Normocephalic, without obvious abnormality, atraumatic  Eyes:    PERRL, conjunctiva/corneas clear, EOM's intact, fundi    benign, both eyes  Ears:    Normal TM's and external ear canals, both ears  Nose:   Nares normal, septum midline, mucosa normal, no drainage    or sinus tenderness  Throat:   Lips, mucosa, and tongue normal; teeth and gums normal  Neck:   Supple, symmetrical, trachea midline, no adenopathy;    thyroid:  no enlargement/tenderness/nodules; no carotid   bruit or JVD  Back:     Symmetric, no curvature, ROM normal, no CVA tenderness  Lungs:     Clear to auscultation bilaterally, respirations unlabored  Chest Wall:    No tenderness or deformity   Heart:    Regular rate and rhythm, S1 and S2 normal, no murmur, rub   or gallop  Breast Exam:    No tenderness, masses, or nipple abnormality  Abdomen:     Soft, non-tender, bowel sounds active all four quadrants,    no masses, no organomegaly   GU: Normal cervix, vagina, labia, normal uterus on bimanual, no masses or tenderness over adnexa.     Extremities:   Extremities normal, atraumatic, no cyanosis or edema  Pulses:   2+ and symmetric all extremities  Skin:   Skin color, texture, turgor normal, no rashes or lesions  Lymph nodes:   Cervical, supraclavicular, and axillary nodes normal  Neurologic:  CNII-XII intact, normal strength, sensation and reflexes    throughout       Assessment:     1. Need for prophylactic vaccination against Streptococcus pneumoniae (pneumococcus)   2. Mixed hyperlipidemia - cont lipitor 20 (rx'ed by Dr. Candee Furbish, cardiology)  3. Medication monitoring encounter   4. Essential hypertension  - 10 mmHg difference between two arms on SBP. If increases, RTC for further eval.  5. Class  1 obesity due to excess calories without serious comorbidity in adult, unspecified BMI   6. Urinary frequency   7. Acute bilateral low back pain without sciatica   8. Family history of hypothyroidism   9. Screening for STD (sexually transmitted disease)   10. Screening for cervical cancer - reviewed no indication for pap smear per USPSTF guidelines and reasons behind them but makes pt very concerned, requests to proceed with pap and std testing  11. Seasonal allergic rhinitis due to pollen   12.    Metabolic syndrome - X3A 5.7 11/2015 with HTN, HLD (though not high trig/low hdl pic), and central obesity - refer to nutrition.  Offered referral to medical weightloss clinic if she cont to have such extreme frustration. Rec calling ins to see if they will over a nutritionist.    SENT [facility].     Plan:     During the course of the visit the patient was educated and counseled about appropriate screening and preventive services including:     [facility] Colonoscopy done 05/2011 by Dr. Cristina Gong at [facility] was completely normal, no polyps, rec repeat in 10  years. DEXA done 10/2014 NORMAL   Immunizations: Gets flu shot annually, had prevnar -13 10/16, tdap 2011 Needs shingles vaccine and pneumovax vaccine (check paperchart for latter)  Diet review for nutrition referral? Yes - pt would like more info on diet as she would really like to loose weight. Advised trial of more aggressive low carb diet since she has glucose intolerance and will need to increase exercise if she wants to see weight changes.   Patient Instructions (the written plan) was given to the patient.  Medicare Attestation I have personally reviewed: The patient's medical and social history Their use of alcohol, tobacco or illicit drugs Their current medications and supplements The patient's functional ability including ADLs,fall risks, home safety risks,  cognitive, and hearing and visual impairment Diet and physical activities Evidence for depression or mood disorders  The patient's weight, height, BMI, and visual acuity have been recorded in the chart.  I have made referrals, counseling, and provided education to the patient based on review of the above and I have provided the patient with a written personalized care plan for preventive services.     Delman Cheadle, MD   07/20/2016    Orders Placed This Encounter  Procedures  . Pneumococcal polysaccharide vaccine 23-valent greater than or equal to 2yo subcutaneous/IM  . CBC  . Comprehensive metabolic panel    Order Specific Question:   Has the patient fasted?    Answer:   Yes  . Lipid panel    Order Specific Question:   Has the patient fasted?    Answer:   Yes  . RPR  . POCT urinalysis dipstick    Meds ordered this encounter  Medications  . DISCONTD: fluticasone (FLONASE) 50 MCG/ACT nasal spray    Sig: Place 2 sprays into both nostrils at bedtime.    Dispense:  16 g  Refill:  5  . lisinopril-hydrochlorothiazide (PRINZIDE,ZESTORETIC) 20-12.5 MG tablet    Sig: Take 1 tablet by mouth daily.    Dispense:  90 tablet    Refill:  3  . fluticasone (FLONASE) 50 MCG/ACT nasal spray    Sig: Place 2 sprays into both nostrils at bedtime.    Dispense:  16 g    Refill:  5    Delman Cheadle, M.D.  Primary Care at [facility] [address] [place],  [address] [phone] phone [phone] fax  07/22/16 10:01 PM

## 2016-07-20 NOTE — Patient Instructions (Addendum)
IF you received an x-ray today, you will receive an invoice from [facility]. Please contact [facility] at [phone] with questions or concerns regarding your invoice.   IF you received labwork today, you will receive an invoice from [place]. Please contact [facility] at [phone] with questions or concerns regarding your invoice.   Our billing staff will not be able to assist you with questions regarding bills from these companies.  You will be contacted with the lab results as soon as they are available. The fastest way to get your results is to activate your My Chart account. Instructions are located on the last page of this paperwork. If you have not heard from [place] regarding the results in 2 weeks, please contact this office.      Health Maintenance for Postmenopausal Women Menopause is a normal process in which your reproductive ability comes to an end. This process happens gradually over a span of months to years, usually between the ages of 26 and 23. Menopause is complete when you have missed 12 consecutive menstrual periods. It is important to talk with your health care provider about some of the most common conditions that affect postmenopausal women, such as heart disease, cancer, and bone loss (osteoporosis). Adopting a healthy lifestyle and getting preventive care can help to promote your health and wellness. Those actions can also lower your chances of developing some of these common conditions. What should I know about menopause? During menopause, you may experience a number of symptoms, such as:  Moderate-to-severe hot flashes.  Night sweats.  Decrease in sex drive.  Mood swings.  Headaches.  Tiredness.  Irritability.  Memory problems.  Insomnia.  Choosing to treat or not to treat menopausal changes is an individual decision that you make with your health care provider. What should I know about hormone replacement therapy and  supplements? Hormone therapy products are effective for treating symptoms that are associated with menopause, such as hot flashes and night sweats. Hormone replacement carries certain risks, especially as you become older. If you are thinking about using estrogen or estrogen with progestin treatments, discuss the benefits and risks with your health care provider. What should I know about heart disease and stroke? Heart disease, heart attack, and stroke become more likely as you age. This may be due, in part, to the hormonal changes that your body experiences during menopause. These can affect how your body processes dietary fats, triglycerides, and cholesterol. Heart attack and stroke are both medical emergencies. There are many things that you can do to help prevent heart disease and stroke:  Have your blood pressure checked at least every 1-2 years. High blood pressure causes heart disease and increases the risk of stroke.  If you are 64-44 years old, ask your health care provider if you should take aspirin to prevent a heart attack or a stroke.  Do not use any tobacco products, including cigarettes, chewing tobacco, or electronic cigarettes. If you need help quitting, ask your health care provider.  It is important to eat a healthy diet and maintain a healthy weight. ? Be sure to include plenty of vegetables, fruits, low-fat dairy products, and lean protein. ? Avoid eating foods that are high in solid fats, added sugars, or salt (sodium).  Get regular exercise. This is one of the most important things that you can do for your health. ? Try to exercise for at least 150 minutes each week. The type of exercise that you do should increase  your heart rate and make you sweat. This is known as moderate-intensity exercise. ? Try to do strengthening exercises at least twice each week. Do these in addition to the moderate-intensity exercise.  Know your numbers.Ask your health care provider to check  your cholesterol and your blood glucose. Continue to have your blood tested as directed by your health care provider.  What should I know about cancer screening? There are several types of cancer. Take the following steps to reduce your risk and to catch any cancer development as early as possible. Breast Cancer  Practice breast self-awareness. ? This means understanding how your breasts normally appear and feel. ? It also means doing regular breast self-exams. Let your health care provider know about any changes, no matter how small.  If you are 18 or older, have a clinician do a breast exam (clinical breast exam or CBE) every year. Depending on your age, family history, and medical history, it may be recommended that you also have a yearly breast X-ray (mammogram).  If you have a family history of breast cancer, talk with your health care provider about genetic screening.  If you are at high risk for breast cancer, talk with your health care provider about having an MRI and a mammogram every year.  Breast cancer (BRCA) gene test is recommended for women who have family members with BRCA-related cancers. Results of the assessment will determine the need for genetic counseling and BRCA1 and for BRCA2 testing. BRCA-related cancers include these types: ? Breast. This occurs in males or females. ? Ovarian. ? Tubal. This may also be called fallopian tube cancer. ? Cancer of the abdominal or pelvic lining (peritoneal cancer). ? Prostate. ? Pancreatic.  Cervical, Uterine, and Ovarian Cancer Your health care provider may recommend that you be screened regularly for cancer of the pelvic organs. These include your ovaries, uterus, and vagina. This screening involves a pelvic exam, which includes checking for microscopic changes to the surface of your cervix (Pap test).  For women ages 21-65, health care providers may recommend a pelvic exam and a Pap test every three years. For women ages 36-65,  they may recommend the Pap test and pelvic exam, combined with testing for human papilloma virus (HPV), every five years. Some types of HPV increase your risk of cervical cancer. Testing for HPV may also be done on women of any age who have unclear Pap test results.  Other health care providers may not recommend any screening for nonpregnant women who are considered low risk for pelvic cancer and have no symptoms. Ask your health care provider if a screening pelvic exam is right for you.  If you have had past treatment for cervical cancer or a condition that could lead to cancer, you need Pap tests and screening for cancer for at least 20 years after your treatment. If Pap tests have been discontinued for you, your risk factors (such as having a new sexual partner) need to be reassessed to determine if you should start having screenings again. Some women have medical problems that increase the chance of getting cervical cancer. In these cases, your health care provider may recommend that you have screening and Pap tests more often.  If you have a family history of uterine cancer or ovarian cancer, talk with your health care provider about genetic screening.  If you have vaginal bleeding after reaching menopause, tell your health care provider.  There are currently no reliable tests available to screen for ovarian cancer.  Lung Cancer Lung cancer screening is recommended for adults 14-22 years old who are at high risk for lung cancer because of a history of smoking. A yearly low-dose CT scan of the lungs is recommended if you:  Currently smoke.  Have a history of at least 30 pack-years of smoking and you currently smoke or have quit within the past 15 years. A pack-year is smoking an average of one pack of cigarettes per day for one year.  Yearly screening should:  Continue until it has been 15 years since you quit.  Stop if you develop a health problem that would prevent you from having lung  cancer treatment.  Colorectal Cancer  This type of cancer can be detected and can often be prevented.  Routine colorectal cancer screening usually begins at age 76 and continues through age 78.  If you have risk factors for colon cancer, your health care provider may recommend that you be screened at an earlier age.  If you have a family history of colorectal cancer, talk with your health care provider about genetic screening.  Your health care provider may also recommend using home test kits to check for hidden blood in your stool.  A small camera at the end of a tube can be used to examine your colon directly (sigmoidoscopy or colonoscopy). This is done to check for the earliest forms of colorectal cancer.  Direct examination of the colon should be repeated every 5-10 years until age 63. However, if early forms of precancerous polyps or small growths are found or if you have a family history or genetic risk for colorectal cancer, you may need to be screened more often.  Skin Cancer  Check your skin from head to toe regularly.  Monitor any moles. Be sure to tell your health care provider: ? About any new moles or changes in moles, especially if there is a change in a mole's shape or color. ? If you have a mole that is larger than the size of a pencil eraser.  If any of your family members has a history of skin cancer, especially at a young age, talk with your health care provider about genetic screening.  Always use sunscreen. Apply sunscreen liberally and repeatedly throughout the day.  Whenever you are outside, protect yourself by wearing long sleeves, pants, a wide-brimmed hat, and sunglasses.  What should I know about osteoporosis? Osteoporosis is a condition in which bone destruction happens more quickly than new bone creation. After menopause, you may be at an increased risk for osteoporosis. To help prevent osteoporosis or the bone fractures that can happen because of  osteoporosis, the following is recommended:  If you are 64-4 years old, get at least 1,000 mg of calcium and at least 600 mg of vitamin D per day.  If you are older than age 56 but younger than age 26, get at least 1,200 mg of calcium and at least 600 mg of vitamin D per day.  If you are older than age 1, get at least 1,200 mg of calcium and at least 800 mg of vitamin D per day.  Smoking and excessive alcohol intake increase the risk of osteoporosis. Eat foods that are rich in calcium and vitamin D, and do weight-bearing exercises several times each week as directed by your health care provider. What should I know about how menopause affects my mental health? Depression may occur at any age, but it is more common as you become older. Common symptoms of  depression include:  Low or sad mood.  Changes in sleep patterns.  Changes in appetite or eating patterns.  Feeling an overall lack of motivation or enjoyment of activities that you previously enjoyed.  Frequent crying spells.  Talk with your health care provider if you think that you are experiencing depression. What should I know about immunizations? It is important that you get and maintain your immunizations. These include:  Tetanus, diphtheria, and pertussis (Tdap) booster vaccine.  Influenza every year before the flu season begins.  Pneumonia vaccine.  Shingles vaccine.  Your health care provider may also recommend other immunizations. This information is not intended to replace advice given to you by your health care provider. Make sure you discuss any questions you have with your health care provider. Document Released: 03/06/2005 Document Revised: 08/02/2015 Document Reviewed: 10/16/2014 Elsevier Interactive Patient Education  2018 [place] Zoster (Shingles) Vaccine, RZV: What You Need to Know 1. Why get vaccinated? Shingles (also called herpes zoster, or just zoster) is a painful skin rash,  often with blisters. Shingles is caused by the varicella zoster virus, the same virus that causes chickenpox. After you have chickenpox, the virus stays in your body and can cause shingles later in life. You can't catch shingles from another person. However, a person who has never had chickenpox (or chickenpox vaccine) could get chickenpox from someone with shingles. A shingles rash usually appears on one side of the face or body and heals within 2 to 4 weeks. Its main symptom is pain, which can be severe. Other symptoms can include fever, headache, chills and upset stomach. Very rarely, a shingles infection can lead to pneumonia, hearing problems, blindness, brain inflammation (encephalitis), or death. For about 1 person in 5, severe pain can continue even long after the rash has cleared up. This long-lasting pain is called post-herpetic neuralgia (PHN). Shingles is far more common in people 72 years of age and older than in younger people, and the risk increases with age. It is also more common in people whose immune system is weakened because of a disease such as cancer, or by drugs such as steroids or chemotherapy. At least 1 million people a year in the [place] get shingles. 2. Shingles vaccine (recombinant) Recombinant shingles vaccine was approved by FDA in 2017 for the prevention of shingles. In clinical trials, it was more than 90% effective in preventing shingles. It can also reduce the likelihood of PHN. Two doses, 2 to 6 months apart, are recommended for adults 72 and older. This vaccine is also recommended for people who have already gotten the live shingles vaccine (Zostavax). There is no live virus in this vaccine. 3. Some people should not get this vaccine Tell your vaccine provider if you:  Have any severe, life-threatening allergies. A person who has ever had a life-threatening allergic reaction after a dose of recombinant shingles vaccine, or has a severe allergy to any  component of this vaccine, may be advised not to be vaccinated. Ask your health care provider if you want information about vaccine components.  Are pregnant or breastfeeding. There is not much information about use of recombinant shingles vaccine in pregnant or nursing women. Your healthcare provider might recommend delaying vaccination.  Are not feeling well. If you have a mild illness, such as a cold, you can probably get the vaccine today. If you are moderately or severely ill, you should probably wait until you recover. Your doctor can advise you.  4. Risks  of a vaccine reaction With any medicine, including vaccines, there is a chance of reactions. After recombinant shingles vaccination, a person might experience:  Pain, redness, soreness, or swelling at the site of the injection  Headache, muscle aches, fever, shivering, fatigue  In clinical trials, most people got a sore arm with mild or moderate pain after vaccination, and some also had redness and swelling where they got the shot. Some people felt tired, had muscle pain, a headache, shivering, fever, stomach pain, or nausea. About 1 out of 6 people who got recombinant zoster vaccine experienced side effects that prevented them from doing regular activities. Symptoms went away on their own in about 2 to 3 days. Side effects were more common in younger people. You should still get the second dose of recombinant zoster vaccine even if you had one of these reactions after the first dose. Other things that could happen after this vaccine:  People sometimes faint after medical procedures, including vaccination. Sitting or lying down for about 15 minutes can help prevent fainting and injuries caused by a fall. Tell your provider if you feel dizzy or have vision changes or ringing in the ears.  Some people get shoulder pain that can be more severe and longer-lasting than routine soreness that can follow injections. This happens very  rarely.  Any medication can cause a severe allergic reaction. Such reactions to a vaccine are estimated at about 1 in a million doses, and would happen within a few minutes to a few hours after the vaccination. As with any medicine, there is a very remote chance of a vaccine causing a serious injury or death. The safety of vaccines is always being monitored. For more information, visit: http://www.aguilar.org/ 5. What if there is a serious problem? What should I look for?  Look for anything that concerns you, such as signs of a severe allergic reaction, very high fever, or unusual behavior. Signs of a severe allergic reaction can include hives, swelling of the face and throat, difficulty breathing, a fast heartbeat, dizziness, and weakness. These would usually start a few minutes to a few hours after the vaccination. What should I do?  If you think it is a severe allergic reaction or other emergency that can't wait, call 9-1-1 and get to the nearest hospital. Otherwise, call your health care provider. Afterward, the reaction should be reported to the Vaccine Adverse Event Reporting System (VAERS). Your doctor should file this report, or you can do it yourself through the VAERS web site atwww.vaers.https://www.bray.com/ by calling 713 393 9452. VAERS does not give medical advice. 6. How can I learn more?  Ask your healthcare provider. He or she can give you the vaccine package insert or suggest other sources of information.  Call your local or state health department.  Contact the Centers for Disease Control and Prevention (CDC): ? Call 928 684 0364 (1-800-CDC-INFO) or ? Visit the CDC's website at http://hunter.com/ CDC Vaccine Information Statement (VIS) Recombinant Zoster Vaccine (03/09/2016) This information is not intended to replace advice given to you by your health care provider. Make sure you discuss any questions you have with your health care provider. Document Released: 03/24/2016  Document Revised: 03/24/2016 Document Reviewed: 03/24/2016 Elsevier Interactive Patient Education  [employer].

## 2016-07-21 LAB — LIPID PANEL
Chol/HDL Ratio: 3 ratio (ref 0.0–4.4)
Cholesterol, Total: 185 mg/dL (ref 100–199)
HDL: 61 mg/dL (ref >39)
LDL CALC: 85 mg/dL (ref 0–99)
TRIGLYCERIDES: 194 mg/dL — AB (ref 0–149)
VLDL CHOLESTEROL CAL: 39 mg/dL (ref 5–40)

## 2016-07-21 LAB — COMPREHENSIVE METABOLIC PANEL
ALK PHOS: 66 IU/L (ref 39–117)
ALT: 26 IU/L (ref 0–32)
AST: 21 IU/L (ref 0–40)
Albumin/Globulin Ratio: 1.9 (ref 1.2–2.2)
Albumin: 4.5 g/dL (ref 3.5–4.8)
BILIRUBIN TOTAL: 0.5 mg/dL (ref 0.0–1.2)
BUN/Creatinine Ratio: 17 (ref 12–28)
BUN: 16 mg/dL (ref 8–27)
CHLORIDE: 102 mmol/L (ref 96–106)
CO2: 22 mmol/L (ref 20–29)
CREATININE: 0.93 mg/dL (ref 0.57–1.00)
Calcium: 10.4 mg/dL — ABNORMAL HIGH (ref 8.7–10.3)
GFR calc Af Amer: 72 mL/min/{1.73_m2} (ref >59)
GFR calc non Af Amer: 62 mL/min/{1.73_m2} (ref >59)
GLUCOSE: 93 mg/dL (ref 65–99)
Globulin, Total: 2.4 g/dL (ref 1.5–4.5)
Potassium: 4.6 mmol/L (ref 3.5–5.2)
Sodium: 143 mmol/L (ref 134–144)
Total Protein: 6.9 g/dL (ref 6.0–8.5)

## 2016-07-21 LAB — CBC
Hematocrit: 44.7 % (ref 34.0–46.6)
Hemoglobin: 15.2 g/dL (ref 11.1–15.9)
MCH: 30 pg (ref 26.6–33.0)
MCHC: 34 g/dL (ref 31.5–35.7)
MCV: 88 fL (ref 79–97)
PLATELETS: 299 10*3/uL (ref 150–379)
RBC: 5.06 x10E6/uL (ref 3.77–5.28)
RDW: 14.6 % (ref 12.3–15.4)
WBC: 6.4 10*3/uL (ref 3.4–10.8)

## 2016-07-21 LAB — RPR: RPR Ser Ql: NONREACTIVE

## 2016-07-22 LAB — PAP IG, CT-NG, RFX HPV ASCU
CHLAMYDIA, NUC. ACID AMP: NEGATIVE
GONOCOCCUS BY NUCLEIC ACID AMP: NEGATIVE
PAP Smear Comment: 0

## 2016-08-31 ENCOUNTER — Telehealth: Payer: Self-pay | Admitting: Family Medicine

## 2016-08-31 NOTE — Telephone Encounter (Signed)
Pt is calling to see if someone could go over her lab results with her.  She is wondering if she is still supposed to take her Lipitor as prescribed and other medications.  I didn't see that Lori Mooney had finished her notes on the lab works though so I am unsure of what to do.  Please advise [phone]

## 2016-09-01 ENCOUNTER — Encounter: Payer: Self-pay | Admitting: Family Medicine

## 2016-09-01 MED ORDER — ATORVASTATIN CALCIUM 20 MG PO TABS: 20.0000 mg | ORAL_TABLET | Freq: Every day | ORAL | 3 refills | Status: DC

## 2016-09-01 NOTE — Telephone Encounter (Signed)
Yes, she can cont on her same lipitor and a refill has been sent to her pharmacy. Note about this and labs sent to pt on MyChart.  Also sent note about Shingrix and nutritionist info.  Please let me know if she has any other questions about labs/lipitor. Thanks.

## 2016-09-01 NOTE — Addendum Note (Signed)
Addended by: Shawnee Knapp on: 09/01/2016 11:46 AM   Modules accepted: Orders

## 2016-09-01 NOTE — Telephone Encounter (Signed)
Pt given message and received message on My Chart Advised, private nutritional staff will call and schedule appointment

## 2016-10-20 ENCOUNTER — Telehealth: Payer: Self-pay | Admitting: Family Medicine

## 2016-10-20 NOTE — Telephone Encounter (Signed)
Pt called stating that she got a high dose flu shot at [facility] on 10/19/16.  I told her we should have it since it was done at [facility] but I didn't see her most recent flu shot in her immunization history.

## 2016-12-28 ENCOUNTER — Encounter: Payer: Self-pay | Admitting: Family Medicine

## 2016-12-28 ENCOUNTER — Other Ambulatory Visit: Payer: Self-pay

## 2016-12-28 ENCOUNTER — Ambulatory Visit (INDEPENDENT_AMBULATORY_CARE_PROVIDER_SITE_OTHER): Payer: Medicare Other | Admitting: Family Medicine

## 2016-12-28 VITALS — BP 118/64 | HR 101 | Temp 98.5°F | Resp 17 | Ht 62.25 in | Wt 197.0 lb

## 2016-12-28 DIAGNOSIS — J069 Acute upper respiratory infection, unspecified: Secondary | ICD-10-CM | POA: Diagnosis not present

## 2016-12-28 DIAGNOSIS — J040 Acute laryngitis: Secondary | ICD-10-CM

## 2016-12-28 MED ORDER — FLUTICASONE PROPIONATE 50 MCG/ACT NA SUSP: 2.0000 | Freq: Every day | NASAL | 5 refills | Status: DC

## 2016-12-28 MED ORDER — GUAIFENESIN-CODEINE 100-10 MG/5ML PO SOLN: 5.0000 mL | Freq: Three times a day (TID) | ORAL | 0 refills | Status: DC | PRN

## 2016-12-28 MED ORDER — BENZONATATE 100 MG PO CAPS: 100.0000 mg | ORAL_CAPSULE | Freq: Three times a day (TID) | ORAL | 0 refills | Status: DC | PRN

## 2016-12-28 NOTE — Progress Notes (Signed)
12/3/201811:13 AM  Lori Mooney November 18, 1945, 71 y.o. female [id number]  Chief Complaint  Patient presents with  . URI    x 3 days, getting progressively worse, no fevers, coughing all night and unable to sleep, tried otc delsym and mucinex dm with no relief.  Laryngitis started x yesterday. 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    HPI:   Patient is a 71 y.o. female with past medical history significant for pneumonia in the past who presents today for 3 days of coughing, minimally productive, clear phlegm and loss of voice. She does have nasal congestion, ear pressure but no pain, denies any sore throat. Denies any fevers but has had chills. Denies SOB. Has been taking OTC meds wo benefit. +sick contacts.   Depression screen [facility] 2/9 12/28/2016 07/20/2016 10/23/2015  Decreased Interest 0 0 0  Down, Depressed, Hopeless 0 0 0  PHQ - 2 Score 0 0 0    Allergies  Allergen Reactions  . Shellfish Allergy Hives  . Strawberry Extract Hives    Prior to Admission medications   Medication Sig Start Date End Date Taking? Authorizing Provider  atorvastatin (LIPITOR) 20 MG tablet Take 1 tablet (20 mg total) by mouth daily. 09/01/16  Yes Shawnee Knapp, MD  Cholecalciferol (VITAMIN D-3) 1000 UNITS CAPS Take 1,000 Units by mouth daily.   Yes [provider]  Cyanocobalamin (VITAMIN B12 PO) Take 1 tablet by mouth daily. Reported on 06/20/2015   Yes [provider]  fluticasone (FLONASE) 50 MCG/ACT nasal spray Place 2 sprays into both nostrils at bedtime. 12/28/16  Yes Rutherford Guys, MD  ibuprofen (ADVIL,MOTRIN) 200 MG tablet Take 400 mg by mouth every 6 (six) hours as needed for pain.   Yes [provider]  lisinopril-hydrochlorothiazide (PRINZIDE,ZESTORETIC) 20-12.5 MG tablet Take 1 tablet by mouth daily. 07/20/16  Yes Shawnee Knapp, MD  loratadine (CLARITIN) 10 MG tablet Take 10 mg by mouth daily.   Yes [provider]  Magnesium 250 MG TABS  Take 250-500 mg by mouth at bedtime. 250 mg for sleep and 500 mg for constipation   Yes [provider]  meclizine (ANTIVERT) 12.5 MG tablet Take 1 tablet (12.5 mg total) by mouth 3 (three) times daily as needed for dizziness. 02/26/14  Yes [name], MD  meloxicam (MOBIC) 15 MG tablet Take 15 mg by mouth daily.   Yes [provider]  Ranitidine HCl (ZANTAC PO) Take 1 tablet by mouth daily as needed (Takes one tablet by mouth for GERD). For heartburn   Yes [provider]    Past Medical History:  Diagnosis Date  . Allergy   . 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 Cataract   . Exogenous obesity   . GERD (gastroesophageal reflux disease)   . Hyperlipidemia    hypercholesterolemia  . Hypertension     Past Surgical History:  Procedure Laterality Date  . CATARACT EXTRACTION W/ INTRAOCULAR LENS IMPLANT Left   . DILITATION & CURRETTAGE/HYSTROSCOPY WITH VERSAPOINT RESECTION N/A 08/25/2012   Procedure: DILATATION & CURETTAGE/HYSTEROSCOPY WITH VERSAPOINT RESECTION;  Surgeon: Princess Bruins, MD;  Location: [facility];  Service: Gynecology;  Laterality: N/A;  . EYE SURGERY    . TONSILLECTOMY    . TUBAL LIGATION      Social History   Tobacco Use  . Smoking status: Never Smoker  . Smokeless tobacco: Never Used  Substance Use Topics  . Alcohol use: No  Alcohol/week: 0.0 oz    Family History  Problem Relation Age of Onset  . Hyperlipidemia Mother   . Heart disease Mother   . Hypertension Mother   . Hypertension Father   . Hyperlipidemia Father   . Hyperlipidemia Sister   . Hypertension Sister     ROS Per hpi  OBJECTIVE:  Blood pressure 118/64, pulse (!) 101, temperature 98.5 F (36.9 C), temperature source Oral, resp. rate 17, height 5' 2.25" (1.581 m), weight 197 lb (89.4 kg), SpO2 95 %.  Physical Exam  Constitutional: She is oriented to person, place, and time and well-developed, well-nourished, and in no distress.  HENT:    Head: Normocephalic and atraumatic.  Right Ear: Hearing, tympanic membrane, external ear and ear canal normal.  Left Ear: Hearing, tympanic membrane, external ear and ear canal normal.  Mouth/Throat: Oropharynx is clear and moist.  Eyes: EOM are normal. Pupils are equal, round, and reactive to light.  Neck: Neck supple.  Cardiovascular: Normal rate, regular rhythm and normal heart sounds. Exam reveals no gallop and no friction rub.  No murmur heard. Pulmonary/Chest: Effort normal and breath sounds normal. She has no wheezes. She has no rales.  Lymphadenopathy:    She has no cervical adenopathy.  Neurological: She is alert and oriented to person, place, and time. Gait normal.  Skin: Skin is warm and dry.    ASSESSMENT and PLAN  1. URI with cough and congestion Discussed supportive measures for URI: increase hydration, rest, OTC medications, etc. RTC precautions discussed.  2. Laryngitis, acute  Other orders - benzonatate (TESSALON PERLES) 100 MG capsule; Take 1 capsule (100 mg total) by mouth 3 (three) times daily as needed for cough. - guaiFENesin-codeine 100-10 MG/5ML syrup; Take 5 mLs by mouth 3 (three) times daily as needed for cough. - fluticasone (FLONASE) 50 MCG/ACT nasal spray; Place 2 sprays into both nostrils at bedtime.  Return if symptoms worsen or fail to improve.    Rutherford Guys, MD [facility] at [place], [place] [address] Ph.  [phone] Fax [phone]

## 2016-12-28 NOTE — Patient Instructions (Addendum)
IF you received an x-ray today, you will receive an invoice from [facility]. Please contact [facility] at [phone] with questions or concerns regarding your invoice.   IF you received labwork today, you will receive an invoice from [place]. Please contact [facility] at [phone] with questions or concerns regarding your invoice.   Our billing staff will not be able to assist you with questions regarding bills from these companies.  You will be contacted with the lab results as soon as they are available. The fastest way to get your results is to activate your My Chart account. Instructions are located on the last page of this paperwork. If you have not heard from [place] regarding the results in 2 weeks, please contact this office.        IF you received an x-ray today, you will receive an invoice from [facility]. Please contact [facility] at [phone] with questions or concerns regarding your invoice.   IF you received labwork today, you will receive an invoice from [place]. Please contact [facility] at [phone] with questions or concerns regarding your invoice.   Our billing staff will not be able to assist you with questions regarding bills from these companies.  You will be contacted with the lab results as soon as they are available. The fastest way to get your results is to activate your My Chart account. Instructions are located on the last page of this paperwork. If you have not heard from [place] regarding the results in 2 weeks, please contact this office.     Laryngitis Laryngitis is inflammation of your vocal cords. This causes hoarseness, coughing, loss of voice, sore throat, or a dry throat. Your vocal cords are two bands of muscles that are found in your throat. When you speak, these cords come together and vibrate. These vibrations come out through your mouth as sound. When your vocal cords are inflamed, your voice sounds  different. Laryngitis can be temporary (acute) or long-term (chronic). Most cases of acute laryngitis improve with time. Chronic laryngitis is laryngitis that lasts for more than three weeks. What are the causes? Acute laryngitis may be caused by:  A viral infection.  Lots of talking, yelling, or singing. This is also called vocal strain.  Bacterial infections.  Chronic laryngitis may be caused by:  Vocal strain.  Injury to your vocal cords.  Acid reflux (gastroesophageal reflux disease or GERD).  Allergies.  Sinus infection.  Smoking.  Alcohol abuse.  Breathing in chemicals or dust.  Growths on the vocal cords.  What increases the risk? Risk factors for laryngitis include:  Smoking.  Alcohol abuse.  Having allergies.  What are the signs or symptoms? Symptoms of laryngitis may include:  Low, hoarse voice.  Loss of voice.  Dry cough.  Sore throat.  Stuffy nose.  How is this diagnosed? Laryngitis may be diagnosed by:  Physical exam.  Throat culture.  Blood test.  Laryngoscopy. This procedure allows your health care provider to look at your vocal cords with a mirror or viewing tube.  How is this treated? Treatment for laryngitis depends on what is causing it. Usually, treatment involves resting your voice and using medicines to soothe your throat. However, if your laryngitis is caused by a bacterial infection, you may need to take antibiotic medicine. If your laryngitis is caused by a growth, you may need to have a procedure to remove it. Follow these instructions at home:  Drink enough fluid to keep your urine clear  or pale yellow.  Breathe in moist air. Use a humidifier if you live in a dry climate.  Take medicines only as directed by your health care provider.  If you were prescribed an antibiotic medicine, finish it all even if you start to feel better.  Do not smoke cigarettes or electronic cigarettes. If you need help quitting, ask your  health care provider.  Talk as little as possible. Also avoid whispering, which can cause vocal strain.  Write instead of talking. Do this until your voice is back to normal. Contact a health care provider if:  You have a fever.  You have increasing pain.  You have difficulty swallowing. Get help right away if:  You cough up blood.  You have trouble breathing. This information is not intended to replace advice given to you by your health care provider. Make sure you discuss any questions you have with your health care provider. Document Released: 01/12/2005 Document Revised: 06/20/2015 Document Reviewed: 06/27/2013 Elsevier Interactive Patient Education  [employer].

## 2017-02-19 DIAGNOSIS — Z85828 Personal history of other malignant neoplasm of skin: Secondary | ICD-10-CM | POA: Diagnosis not present

## 2017-02-19 DIAGNOSIS — L853 Xerosis cutis: Secondary | ICD-10-CM | POA: Diagnosis not present

## 2017-02-19 DIAGNOSIS — L570 Actinic keratosis: Secondary | ICD-10-CM | POA: Diagnosis not present

## 2017-02-19 DIAGNOSIS — L720 Epidermal cyst: Secondary | ICD-10-CM | POA: Diagnosis not present

## 2017-02-19 DIAGNOSIS — L821 Other seborrheic keratosis: Secondary | ICD-10-CM | POA: Diagnosis not present

## 2017-02-19 DIAGNOSIS — L814 Other melanin hyperpigmentation: Secondary | ICD-10-CM | POA: Diagnosis not present

## 2017-03-08 ENCOUNTER — Encounter: Payer: Self-pay | Admitting: Family Medicine

## 2017-03-08 ENCOUNTER — Ambulatory Visit (INDEPENDENT_AMBULATORY_CARE_PROVIDER_SITE_OTHER): Payer: Medicare Other

## 2017-03-08 ENCOUNTER — Other Ambulatory Visit: Payer: Self-pay

## 2017-03-08 ENCOUNTER — Ambulatory Visit (INDEPENDENT_AMBULATORY_CARE_PROVIDER_SITE_OTHER): Payer: Medicare Other | Admitting: Family Medicine

## 2017-03-08 VITALS — BP 130/62 | HR 115 | Temp 101.6°F | Resp 18 | Ht 62.25 in | Wt 199.2 lb

## 2017-03-08 DIAGNOSIS — R509 Fever, unspecified: Secondary | ICD-10-CM | POA: Diagnosis not present

## 2017-03-08 DIAGNOSIS — J111 Influenza due to unidentified influenza virus with other respiratory manifestations: Secondary | ICD-10-CM

## 2017-03-08 DIAGNOSIS — R110 Nausea: Secondary | ICD-10-CM

## 2017-03-08 DIAGNOSIS — J029 Acute pharyngitis, unspecified: Secondary | ICD-10-CM | POA: Diagnosis not present

## 2017-03-08 DIAGNOSIS — R05 Cough: Secondary | ICD-10-CM

## 2017-03-08 DIAGNOSIS — R059 Cough, unspecified: Secondary | ICD-10-CM

## 2017-03-08 LAB — POC INFLUENZA A&B (BINAX/QUICKVUE)
Influenza A, POC: POSITIVE — AB
Influenza B, POC: NEGATIVE

## 2017-03-08 LAB — POCT RAPID STREP A (OFFICE): Rapid Strep A Screen: NEGATIVE

## 2017-03-08 MED ORDER — BENZONATATE 100 MG PO CAPS: 100.0000 mg | ORAL_CAPSULE | Freq: Three times a day (TID) | ORAL | 0 refills | Status: DC | PRN

## 2017-03-08 MED ORDER — ONDANSETRON 4 MG PO TBDP: 4.0000 mg | ORAL_TABLET | Freq: Three times a day (TID) | ORAL | 0 refills | Status: DC | PRN

## 2017-03-08 MED ORDER — HYDROCODONE-HOMATROPINE 5-1.5 MG/5ML PO SYRP: ORAL_SOLUTION | ORAL | 0 refills | Status: DC

## 2017-03-08 MED ORDER — OSELTAMIVIR PHOSPHATE 75 MG PO CAPS
75.0000 mg | ORAL_CAPSULE | Freq: Two times a day (BID) | ORAL | 0 refills | Status: DC
Start: 2017-03-08 — End: 2017-06-19

## 2017-03-08 NOTE — Progress Notes (Signed)
Subjective:  By signing my name below, I, Lori Mooney, attest that this documentation has been prepared under the direction and in the presence of Lori Agreste, MD Electronically Signed: Ladene Artist, ED Scribe 03/08/2017 at 12:00 PM.   Patient ID: Lori Mooney, female    DOB: 03-Sep-1945, 72 y.o.   MRN: [id number]  Chief Complaint  Patient presents with  . Cough  . Sore Throat    x1week   . Fever    yesterday todays temp was 101.6 take tylenol at 7am    HPI  Lori Mooney is a 72 y.o. female who presents to Primary Care at [facility] complaining of cough and sore throat x 1 wk without any other symptoms. Pt noticed fever with Tmax of 101.6 F onset yesterday, generalized myalgias, low back pain, worsened cough and sore throat since last night. Also reports some palpitations while playing a board game, nausea and post-tussive emesis onset last night. She has taken Tylenol at 7 AM this morning, Tussionex, Chloraseptic spray. Denies appetite change, sob, diarrhea, lightheadedness, dizziness. 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 She did receive a flu vaccine this season. Does report that she took some previously prescribed codeine cough syrup and noticed minimal sob that lasted for a few seconds before self-resolving.  Patient Active Problem List   Diagnosis Date Noted  . GERD (gastroesophageal reflux disease) 09/19/2013  . Vertigo 03/02/2012  . Hypercholesterolemia 10/08/2010  . Benign hypertensive heart disease without heart failure 10/08/2010  . Obesity 10/08/2010   Past Medical History:  Diagnosis Date  . Allergy   . 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 Cataract   . Exogenous obesity   . GERD (gastroesophageal reflux disease)   . Hyperlipidemia    hypercholesterolemia  . Hypertension    Past Surgical History:  Procedure Laterality Date  . CATARACT EXTRACTION W/ INTRAOCULAR LENS IMPLANT Left   . DILITATION & CURRETTAGE/HYSTROSCOPY WITH VERSAPOINT  RESECTION N/A 08/25/2012   Procedure: DILATATION & CURETTAGE/HYSTEROSCOPY WITH VERSAPOINT RESECTION;  Surgeon: Princess Bruins, MD;  Location: [facility];  Service: Gynecology;  Laterality: N/A;  . EYE SURGERY    . TONSILLECTOMY    . TUBAL LIGATION     Allergies  Allergen Reactions  . Shellfish Allergy Hives  . Strawberry Extract Hives   Prior to Admission medications   Medication Sig Start Date End Date Taking? Authorizing Provider  atorvastatin (LIPITOR) 20 MG tablet Take 1 tablet (20 mg total) by mouth daily. 09/01/16   Shawnee Knapp, MD  benzonatate (TESSALON PERLES) 100 MG capsule Take 1 capsule (100 mg total) by mouth 3 (three) times daily as needed for cough. 12/28/16   Rutherford Guys, MD  Cholecalciferol (VITAMIN D-3) 1000 UNITS CAPS Take 1,000 Units by mouth daily.    [provider]  Cyanocobalamin (VITAMIN B12 PO) Take 1 tablet by mouth daily. Reported on 06/20/2015    [provider]  fluticasone (FLONASE) 50 MCG/ACT nasal spray Place 2 sprays into both nostrils at bedtime. 12/28/16   Rutherford Guys, MD  guaiFENesin-codeine 100-10 MG/5ML syrup Take 5 mLs by mouth 3 (three) times daily as needed for cough. 12/28/16   Rutherford Guys, MD  ibuprofen (ADVIL,MOTRIN) 200 MG tablet Take 400 mg by mouth every 6 (six) hours as needed for pain.    [provider]  lisinopril-hydrochlorothiazide (PRINZIDE,ZESTORETIC) 20-12.5 MG tablet Take 1 tablet by mouth daily. 07/20/16   Shawnee Knapp, MD  loratadine (CLARITIN) 10 MG tablet Take 10 mg by mouth daily.    [provider]  Magnesium 250 MG TABS Take 250-500 mg by mouth at bedtime. 250 mg for sleep and 500 mg for constipation    [provider]  meclizine (ANTIVERT) 12.5 MG tablet Take 1 tablet (12.5 mg total) by mouth 3 (three) times daily as needed for dizziness. 02/26/14   [name], MD  meloxicam (MOBIC) 15 MG tablet Take 15 mg by mouth daily.    [provider]  Ranitidine HCl (ZANTAC PO)  Take 1 tablet by mouth daily as needed (Takes one tablet by mouth for GERD). For heartburn    [provider]   Social History   Socioeconomic History  . Marital status: Married    Spouse name: Not on file  . Number of children: Not on file  . Years of education: Not on file  . Highest education level: Not on file  Social Needs  . Financial resource strain: Not on file  . Food insecurity - worry: Not on file  . Food insecurity - inability: Not on file  . Transportation needs - medical: Not on file  . Transportation needs - non-medical: Not on file  Occupational History  . Occupation: Retired Therapist, sports  Tobacco Use  . Smoking status: Never Smoker  . Smokeless tobacco: Never Used  Substance and Sexual Activity  . Alcohol use: No    Alcohol/week: 0.0 oz  . Drug use: No  . Sexual activity: Yes  Other Topics Concern  . Not on file  Social History Narrative   Married. Education: [employer].   Review of Systems  Constitutional: Positive for fever. Negative for appetite change.  HENT: Positive for sore throat.   Respiratory: Positive for cough. Negative for shortness of breath.   Cardiovascular: Positive for palpitations.  Gastrointestinal: Positive for nausea and vomiting (post-tussive). Negative for diarrhea.  Musculoskeletal: Positive for back pain and myalgias.  Neurological: Negative for dizziness and light-headedness.      Objective:   Physical Exam  Constitutional: She is oriented to person, place, and time. She appears well-developed and well-nourished. No distress.  HENT:  Head: Normocephalic and atraumatic.  Right Ear: Hearing, tympanic membrane, external ear and ear canal normal.  Left Ear: Hearing, tympanic membrane, external ear and ear canal normal.  Nose: Nose normal.  Mouth/Throat: Oropharynx is clear and moist. No oropharyngeal exudate.  Eyes: Conjunctivae and EOM are normal. Pupils are equal, round, and reactive to light.  Cardiovascular: Normal rate,  regular rhythm, normal heart sounds and intact distal pulses.  No murmur heard. Pulmonary/Chest: Effort normal. No respiratory distress. She has no wheezes. She has rhonchi.  Possible faint rhonchi R lower lobe.  Neurological: She is alert and oriented to person, place, and time.  Skin: Skin is warm and dry. No rash noted.  Psychiatric: She has a normal mood and affect. Her behavior is normal.  Vitals reviewed.  Vitals:   03/08/17 1132  BP: 130/62  Pulse: (!) 115  Resp: 18  Temp: (!) 101.6 F (38.7 C)  TempSrc: Oral  SpO2: 96%  Weight: 199 lb 3.2 oz (90.4 kg)  Height: 5' 2.25" (1.581 m)   Results for orders placed or performed in visit on 03/08/17  POCT rapid strep A  Result Value Ref Range   Rapid Strep A Screen Negative Negative  POC Influenza A&B(BINAX/QUICKVUE)  Result Value Ref Range   Influenza A, POC Positive (A) Negative   Influenza B, POC  Negative Negative   Dg Chest 2 View  Result Date: 03/08/2017 CLINICAL DATA:  Cough and fever. EXAM: CHEST  2 VIEW COMPARISON:  10/23/2015 FINDINGS: The heart size and mediastinal contours are within normal limits. Both lungs are clear. The visualized skeletal structures are unremarkable. IMPRESSION: No active cardiopulmonary disease. Electronically Signed   By: Earle Gell M.D.   On: 03/08/2017 12:36      Assessment & Plan:  AUDI CONOVER is a 72 y.o. female Sore throat - Plan: POCT rapid strep A, POC Influenza A&B(BINAX/QUICKVUE)  Cough - Plan: DG Chest 2 View, benzonatate (TESSALON) 100 MG capsule, HYDROcodone-homatropine (HYCODAN) 5-1.5 MG/5ML syrup  Fever, unspecified - Plan: DG Chest 2 View  Influenza with respiratory manifestation - Plan: DG Chest 2 View, oseltamivir (TAMIFLU) 75 MG capsule  Nausea without vomiting - Plan: ondansetron (ZOFRAN ODT) 4 MG disintegrating tablet  Influenza A. Associated with cough as well as nausea and vomiting. Appears well-hydrated, nontoxic appearance. Likely had separate viral illness  with recent onset of influenza when discussing history and reassuring x-ray.  -based on age will prescribe Tamiflu.  Potential side effects discussed. Zofran if needed for nausea. Contact precautions discussed. RTC precautions discussed.  Meds ordered this encounter  Medications  . ondansetron (ZOFRAN ODT) 4 MG disintegrating tablet    Sig: Take 1 tablet (4 mg total) by mouth every 8 (eight) hours as needed for nausea or vomiting.    Dispense:  10 tablet    Refill:  0  . oseltamivir (TAMIFLU) 75 MG capsule    Sig: Take 1 capsule (75 mg total) by mouth 2 (two) times daily.    Dispense:  10 capsule    Refill:  0  . benzonatate (TESSALON) 100 MG capsule    Sig: Take 1 capsule (100 mg total) by mouth 3 (three) times daily as needed for cough.    Dispense:  20 capsule    Refill:  0  . HYDROcodone-homatropine (HYCODAN) 5-1.5 MG/5ML syrup    Sig: 49m by mouth a bedtime as needed for cough.    Dispense:  120 mL    Refill:  0   Patient Instructions   Can start Tamiflu as discussed. Saline nasal spray at least 4 times per day if needed for nasal congestion, over the counter mucinex or tessalon if needed during the day for cough, or hydrocodone cough syrup up to every 6 hours if needed, especially at bedtime. Zofran was sent in for nausea or vomiting, but as cough improves I suspect you will experience less nausea/vomiting. Tylenol or ibuprofen over the counter for fever and body aches, and drink plenty of fluids. Other information as in instructions below.  Return to the clinic or go to the nearest emergency room if any of your symptoms worsen or new symptoms occur.   Influenza, Adult Influenza, more commonly known as "the flu," is a viral infection that primarily affects the respiratory tract. The respiratory tract includes organs that help you breathe, such as the lungs, nose, and throat. The flu causes many common cold symptoms, as well as a high fever and body aches. The flu spreads easily from  person to person (is contagious). Getting a flu shot (influenza vaccination) every year is the best way to prevent influenza. What are the causes? Influenza is caused by a virus. You can catch the virus by:  Breathing in droplets from an infected person's cough or sneeze.  Touching something that was recently contaminated with the virus and then touching  your mouth, nose, or eyes.  What increases the risk? The following factors may make you more likely to get the flu:  Not cleaning your hands frequently with soap and water or alcohol-based hand sanitizer.  Having close contact with many people during cold and flu season.  Touching your mouth, eyes, or nose without washing or sanitizing your hands first.  Not drinking enough fluids or not eating a healthy diet.  Not getting enough sleep or exercise.  Being under a high amount of stress.  Not getting a yearly (annual) flu shot.  You may be at a higher risk of complications from the flu, such as a severe lung infection (pneumonia), if you:  Are over the age of 18.  Are pregnant.  Have a weakened disease-fighting system (immune system). You may have a weakened immune system if you: ? Have HIV or AIDS. ? Are undergoing chemotherapy. ? Aretaking medicines that reduce the activity of (suppress) the immune system.  Have a long-term (chronic) illness, such as heart disease, kidney disease, diabetes, or lung disease.  Have a liver disorder.  Are obese.  Have anemia.  What are the signs or symptoms? Symptoms of this condition typically last 4-10 days and may include:  Fever.  Chills.  Headache, body aches, or muscle aches.  Sore throat.  Cough.  Runny or congested nose.  Chest discomfort and cough.  Poor appetite.  Weakness or tiredness (fatigue).  Dizziness.  Nausea or vomiting.  How is this diagnosed? This condition may be diagnosed based on your medical history and a physical exam. Your health care  provider may do a nose or throat swab test to confirm the diagnosis. How is this treated? If influenza is detected early, you can be treated with antiviral medicine that can reduce the length of your illness and the severity of your symptoms. This medicine may be given by mouth (orally) or through an IV tube that is inserted in one of your veins. The goal of treatment is to relieve symptoms by taking care of yourself at home. This may include taking over-the-counter medicines, drinking plenty of fluids, and adding humidity to the air in your home. In some cases, influenza goes away on its own. Severe influenza or complications from influenza may be treated in a hospital. Follow these instructions at home:  Take over-the-counter and prescription medicines only as told by your health care provider.  Use a cool mist humidifier to add humidity to the air in your home. This can make breathing easier.  Rest as needed.  Drink enough fluid to keep your urine clear or pale yellow.  Cover your mouth and nose when you cough or sneeze.  Wash your hands with soap and water often, especially after you cough or sneeze. If soap and water are not available, use hand sanitizer.  Stay home from work or school as told by your health care provider. Unless you are visiting your health care provider, try to avoid leaving home until your fever has been gone for 24 hours without the use of medicine.  Keep all follow-up visits as told by your health care provider. This is important. How is this prevented?  Getting an annual flu shot is the best way to avoid getting the flu. You may get the flu shot in late summer, fall, or winter. Ask your health care provider when you should get your flu shot.  Wash your hands often or use hand sanitizer often.  Avoid contact with people who are  sick during cold and flu season.  Eat a healthy diet, drink plenty of fluids, get enough sleep, and exercise regularly. Contact a  health care provider if:  You develop new symptoms.  You have: ? Chest pain. ? Diarrhea. ? A fever.  Your cough gets worse.  You produce more mucus.  You feel nauseous or you vomit. Get help right away if:  You develop shortness of breath or difficulty breathing.  Your skin or nails turn a bluish color.  You have severe pain or stiffness in your neck.  You develop a sudden headache or sudden pain in your face or ear.  You cannot stop vomiting. This information is not intended to replace advice given to you by your health care provider. Make sure you discuss any questions you have with your health care provider. Document Released: 01/10/2000 Document Revised: 06/20/2015 Document Reviewed: 11/06/2014 Elsevier Interactive Patient Education  2017 [employer].     IF you received an x-ray today, you will receive an invoice from [facility]. Please contact [facility] at [phone] with questions or concerns regarding your invoice.   IF you received labwork today, you will receive an invoice from [place]. Please contact [facility] at [phone] with questions or concerns regarding your invoice.   Our billing staff will not be able to assist you with questions regarding bills from these companies.  You will be contacted with the lab results as soon as they are available. The fastest way to get your results is to activate your My Chart account. Instructions are located on the last page of this paperwork. If you have not heard from [place] regarding the results in 2 weeks, please contact this office.      I personally performed the services described in this documentation, which was scribed in my presence. The recorded information has been reviewed and considered for accuracy and completeness, addended by me as needed, and agree with information above.  Signed,   Merri Ray, MD Primary Care at [place].  03/10/17 10:28 PM

## 2017-03-08 NOTE — Patient Instructions (Addendum)
Can start Tamiflu as discussed. Saline nasal spray at least 4 times per day if needed for nasal congestion, over the counter mucinex or tessalon if needed during the day for cough, or hydrocodone cough syrup up to every 6 hours if needed, especially at bedtime. Zofran was sent in for nausea or vomiting, but as cough improves I suspect you will experience less nausea/vomiting. Tylenol or ibuprofen over the counter for fever and body aches, and drink plenty of fluids. Other information as in instructions below.  Return to the clinic or go to the nearest emergency room if any of your symptoms worsen or new symptoms occur.   Influenza, Adult Influenza, more commonly known as "the flu," is a viral infection that primarily affects the respiratory tract. The respiratory tract includes organs that help you breathe, such as the lungs, nose, and throat. The flu causes many common cold symptoms, as well as a high fever and body aches. The flu spreads easily from person to person (is contagious). Getting a flu shot (influenza vaccination) every year is the best way to prevent influenza. What are the causes? Influenza is caused by a virus. You can catch the virus by:  Breathing in droplets from an infected person's cough or sneeze.  Touching something that was recently contaminated with the virus and then touching your mouth, nose, or eyes.  What increases the risk? The following factors may make you more likely to get the flu:  Not cleaning your hands frequently with soap and water or alcohol-based hand sanitizer.  Having close contact with many people during cold and flu season.  Touching your mouth, eyes, or nose without washing or sanitizing your hands first.  Not drinking enough fluids or not eating a healthy diet.  Not getting enough sleep or exercise.  Being under a high amount of stress.  Not getting a yearly (annual) flu shot.  You may be at a higher risk of complications from the flu, such  as a severe lung infection (pneumonia), if you:  Are over the age of 74.  Are pregnant.  Have a weakened disease-fighting system (immune system). You may have a weakened immune system if you: ? Have HIV or AIDS. ? Are undergoing chemotherapy. ? Aretaking medicines that reduce the activity of (suppress) the immune system.  Have a long-term (chronic) illness, such as heart disease, kidney disease, diabetes, or lung disease.  Have a liver disorder.  Are obese.  Have anemia.  What are the signs or symptoms? Symptoms of this condition typically last 4-10 days and may include:  Fever.  Chills.  Headache, body aches, or muscle aches.  Sore throat.  Cough.  Runny or congested nose.  Chest discomfort and cough.  Poor appetite.  Weakness or tiredness (fatigue).  Dizziness.  Nausea or vomiting.  How is this diagnosed? This condition may be diagnosed based on your medical history and a physical exam. Your health care provider may do a nose or throat swab test to confirm the diagnosis. How is this treated? If influenza is detected early, you can be treated with antiviral medicine that can reduce the length of your illness and the severity of your symptoms. This medicine may be given by mouth (orally) or through an IV tube that is inserted in one of your veins. The goal of treatment is to relieve symptoms by taking care of yourself at home. This may include taking over-the-counter medicines, drinking plenty of fluids, and adding humidity to the air in your home. In  some cases, influenza goes away on its own. Severe influenza or complications from influenza may be treated in a hospital. Follow these instructions at home:  Take over-the-counter and prescription medicines only as told by your health care provider.  Use a cool mist humidifier to add humidity to the air in your home. This can make breathing easier.  Rest as needed.  Drink enough fluid to keep your urine clear  or pale yellow.  Cover your mouth and nose when you cough or sneeze.  Wash your hands with soap and water often, especially after you cough or sneeze. If soap and water are not available, use hand sanitizer.  Stay home from work or school as told by your health care provider. Unless you are visiting your health care provider, try to avoid leaving home until your fever has been gone for 24 hours without the use of medicine.  Keep all follow-up visits as told by your health care provider. This is important. How is this prevented?  Getting an annual flu shot is the best way to avoid getting the flu. You may get the flu shot in late summer, fall, or winter. Ask your health care provider when you should get your flu shot.  Wash your hands often or use hand sanitizer often.  Avoid contact with people who are sick during cold and flu season.  Eat a healthy diet, drink plenty of fluids, get enough sleep, and exercise regularly. Contact a health care provider if:  You develop new symptoms.  You have: ? Chest pain. ? Diarrhea. ? A fever.  Your cough gets worse.  You produce more mucus.  You feel nauseous or you vomit. Get help right away if:  You develop shortness of breath or difficulty breathing.  Your skin or nails turn a bluish color.  You have severe pain or stiffness in your neck.  You develop a sudden headache or sudden pain in your face or ear.  You cannot stop vomiting. This information is not intended to replace advice given to you by your health care provider. Make sure you discuss any questions you have with your health care provider. Document Released: 01/10/2000 Document Revised: 06/20/2015 Document Reviewed: 11/06/2014 Elsevier Interactive Patient Education  2017 [employer].     IF you received an x-ray today, you will receive an invoice from [facility]. Please contact [facility] at [phone] with questions or concerns regarding your  invoice.   IF you received labwork today, you will receive an invoice from [facility]. Please contact [facility] at [phone] with questions or concerns regarding your invoice.   Our billing staff will not be able to assist you with questions regarding bills from these companies.  You will be contacted with the lab results as soon as they are available. The fastest way to get your results is to activate your My Chart account. Instructions are located on the last page of this paperwork. If you have not heard from [place] regarding the results in 2 weeks, please contact this office.

## 2017-04-05 DIAGNOSIS — Z961 Presence of intraocular lens: Secondary | ICD-10-CM | POA: Diagnosis not present

## 2017-04-05 DIAGNOSIS — H524 Presbyopia: Secondary | ICD-10-CM | POA: Diagnosis not present

## 2017-04-05 DIAGNOSIS — H25011 Cortical age-related cataract, right eye: Secondary | ICD-10-CM | POA: Diagnosis not present

## 2017-04-05 DIAGNOSIS — H5213 Myopia, bilateral: Secondary | ICD-10-CM | POA: Diagnosis not present

## 2017-04-05 DIAGNOSIS — H35372 Puckering of macula, left eye: Secondary | ICD-10-CM | POA: Diagnosis not present

## 2017-04-05 DIAGNOSIS — H52203 Unspecified astigmatism, bilateral: Secondary | ICD-10-CM | POA: Diagnosis not present

## 2017-04-05 DIAGNOSIS — H2511 Age-related nuclear cataract, right eye: Secondary | ICD-10-CM | POA: Diagnosis not present

## 2017-05-10 ENCOUNTER — Other Ambulatory Visit: Payer: Self-pay | Admitting: Family Medicine

## 2017-05-10 DIAGNOSIS — Z1231 Encounter for screening mammogram for malignant neoplasm of breast: Secondary | ICD-10-CM

## 2017-05-14 DIAGNOSIS — H35372 Puckering of macula, left eye: Secondary | ICD-10-CM | POA: Diagnosis not present

## 2017-05-14 DIAGNOSIS — Z961 Presence of intraocular lens: Secondary | ICD-10-CM | POA: Diagnosis not present

## 2017-05-14 DIAGNOSIS — H2511 Age-related nuclear cataract, right eye: Secondary | ICD-10-CM | POA: Diagnosis not present

## 2017-05-14 DIAGNOSIS — H43393 Other vitreous opacities, bilateral: Secondary | ICD-10-CM | POA: Diagnosis not present

## 2017-06-19 ENCOUNTER — Other Ambulatory Visit: Payer: Self-pay

## 2017-06-19 ENCOUNTER — Ambulatory Visit (INDEPENDENT_AMBULATORY_CARE_PROVIDER_SITE_OTHER): Payer: Medicare Other | Admitting: Emergency Medicine

## 2017-06-19 ENCOUNTER — Encounter: Payer: Self-pay | Admitting: Emergency Medicine

## 2017-06-19 VITALS — BP 143/80 | HR 90 | Temp 98.4°F | Resp 18 | Ht 62.25 in | Wt 197.2 lb

## 2017-06-19 DIAGNOSIS — M5431 Sciatica, right side: Secondary | ICD-10-CM | POA: Diagnosis not present

## 2017-06-19 MED ORDER — KETOROLAC TROMETHAMINE 60 MG/2ML IM SOLN
60.0000 mg | Freq: Once | INTRAMUSCULAR | Status: AC
  Administered 2017-06-19: 60 mg via INTRAMUSCULAR

## 2017-06-19 MED ORDER — HYDROCODONE-ACETAMINOPHEN 5-325 MG PO TABS: 1.0000 | ORAL_TABLET | Freq: Four times a day (QID) | ORAL | 0 refills | Status: DC | PRN

## 2017-06-19 NOTE — Progress Notes (Signed)
Lori Mooney 72 y.o.   Chief Complaint  Patient presents with  . Hip Pain    right hip and numbness in groin area and trouble sitting to use the bathroom     HISTORY OF PRESENT ILLNESS: [name] complaining of pain to right lower back radiating to the right hip and down the back of the right leg started several days ago.  Able to urinate okay.  Able have a bowel movement.  Denies neurological symptoms to the leg.  Took some ibuprofen earlier today and feels better.  States pain is an 8 when she walks.  Back Pain  This is a new problem. The current episode started in the past 7 days. The problem occurs constantly. The problem has been waxing and waning since onset. The pain is present in the lumbar spine. The pain radiates to the right thigh (right hip). The pain is at a severity of 7/10. The pain is moderate. The symptoms are aggravated by position and standing (walking). Stiffness is present all day. Associated symptoms include leg pain and pelvic pain (right groin). Pertinent negatives include no abdominal pain, bladder incontinence, bowel incontinence, chest pain, dysuria, fever, numbness, paresis, paresthesias, perianal numbness, tingling or weakness. Risk factors include obesity. She has tried NSAIDs for the symptoms. The treatment provided mild relief.     Prior to Admission medications   Medication Sig Start Date End Date Taking? Authorizing Provider  atorvastatin (LIPITOR) 20 MG tablet Take 1 tablet (20 mg total) by mouth daily. 09/01/16  Yes Shawnee Knapp, MD  Cholecalciferol (VITAMIN D-3) 1000 UNITS CAPS Take 1,000 Units by mouth daily.   Yes [provider]  Cyanocobalamin (VITAMIN B12 PO) Take 1 tablet by mouth daily. Reported on 06/20/2015   Yes [provider]  ibuprofen (ADVIL,MOTRIN) 200 MG tablet Take 400 mg by mouth every 6 (six) hours as needed for pain.   Yes [provider]  lisinopril-hydrochlorothiazide (PRINZIDE,ZESTORETIC)  20-12.5 MG tablet Take 1 tablet by mouth daily. 07/20/16  Yes Shawnee Knapp, MD  loratadine (CLARITIN) 10 MG tablet Take 10 mg by mouth daily.   Yes [provider]  Magnesium 250 MG TABS Take 250-500 mg by mouth at bedtime. 250 mg for sleep and 500 mg for constipation   Yes [provider]  Ranitidine HCl (ZANTAC PO) Take 1 tablet by mouth daily as needed (Takes one tablet by mouth for GERD). For heartburn   Yes [provider]  guaiFENesin-codeine 100-10 MG/5ML syrup Take 5 mLs by mouth 3 (three) times daily as needed for cough. Patient not taking: Reported on 06/19/2017 12/28/16   Rutherford Guys, MD  HYDROcodone-homatropine [facility]) 5-1.5 MG/5ML syrup 84m by mouth a bedtime as needed for cough. Patient not taking: Reported on 06/19/2017 03/08/17   [name], MD  meclizine (ANTIVERT) 12.5 MG tablet Take 1 tablet (12.5 mg total) by mouth 3 (three) times daily as needed for dizziness. Patient not taking: Reported on 06/19/2017 02/26/14   [name], MD  meloxicam (MOBIC) 15 MG tablet Take 15 mg by mouth daily.    [provider]  ondansetron (ZOFRAN ODT) 4 MG disintegrating tablet Take 1 tablet (4 mg total) by mouth every 8 (eight) hours as needed for nausea or vomiting. Patient not taking: Reported on 06/19/2017 03/08/17   [name], MD    Allergies  Allergen Reactions  . Shellfish Allergy Hives  . Strawberry Extract Hives    Patient  Active Problem List   Diagnosis Date Noted  . GERD (gastroesophageal reflux disease) 09/19/2013  . Vertigo 03/02/2012  . Hypercholesterolemia 10/08/2010  . Benign hypertensive heart disease without heart failure 10/08/2010  . Obesity 10/08/2010    Past Medical History:  Diagnosis Date  . Allergy   . 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 Cataract   . Exogenous obesity   . GERD (gastroesophageal reflux disease)   . Hyperlipidemia    hypercholesterolemia  . Hypertension     Past Surgical  History:  Procedure Laterality Date  . CATARACT EXTRACTION W/ INTRAOCULAR LENS IMPLANT Left   . DILITATION & CURRETTAGE/HYSTROSCOPY WITH VERSAPOINT RESECTION N/A 08/25/2012   Procedure: DILATATION & CURETTAGE/HYSTEROSCOPY WITH VERSAPOINT RESECTION;  Surgeon: Princess Bruins, MD;  Location: [facility];  Service: Gynecology;  Laterality: N/A;  . EYE SURGERY    . TONSILLECTOMY    . TUBAL LIGATION      Social History   Socioeconomic History  . Marital status: Married    Spouse name: Not on file  . Number of children: Not on file  . Years of education: Not on file  . Highest education level: Not on file  Occupational History  . Occupation: Retired Animal nutritionist  . Financial resource strain: Not on file  . Food insecurity:    Worry: Not on file    Inability: Not on file  . Transportation needs:    Medical: Not on file    Non-medical: Not on file  Tobacco Use  . Smoking status: Never Smoker  . Smokeless tobacco: Never Used  Substance and Sexual Activity  . Alcohol use: No    Alcohol/week: 0.0 oz  . Drug use: No  . Sexual activity: Yes  Lifestyle  . Physical activity:    Days per week: Not on file    Minutes per session: Not on file  . Stress: Not on file  Relationships  . Social connections:    Talks on phone: Not on file    Gets together: Not on file    Attends religious service: Not on file    Active member of club or organization: Not on file    Attends meetings of clubs or organizations: Not on file    Relationship status: Not on file  . Intimate partner violence:    Fear of current or ex partner: Not on file    Emotionally abused: Not on file    Physically abused: Not on file    Forced sexual activity: Not on file  Other Topics Concern  . Not on file  Social History Narrative   Married. Education: [employer].    Family History  Problem Relation Age of Onset  . Hyperlipidemia Mother   . Heart disease Mother   . Hypertension Mother   . Hypertension Father     . Hyperlipidemia Father   . Hyperlipidemia Sister   . Hypertension Sister      Review of Systems  Constitutional: Negative.  Negative for chills and fever.  HENT: Negative.   Respiratory: Negative.  Negative for cough and shortness of breath.   Cardiovascular: Negative.  Negative for chest pain and palpitations.  Gastrointestinal: Negative for abdominal pain, blood in stool, bowel incontinence, diarrhea, melena, nausea and vomiting.  Genitourinary: Positive for pelvic pain (right groin). Negative for bladder incontinence, dysuria, flank pain, hematuria and urgency.  Musculoskeletal: Positive for back pain. Negative for myalgias.  Skin: Negative.  Negative for rash.  Neurological:  Negative for dizziness, tingling, weakness, numbness and paresthesias.  Endo/Heme/Allergies: Negative.   All other systems reviewed and are negative.   Vitals:   06/19/17 1537  BP: (!) 143/80  Pulse: 90  Resp: 18  Temp: 98.4 F (36.9 C)  SpO2: 98%    Physical Exam  Constitutional: She is oriented to person, place, and time. She appears well-developed and well-nourished.  HENT:  Head: Normocephalic and atraumatic.  Eyes: Pupils are equal, round, and reactive to light. EOM are normal.  Neck: Normal range of motion.  Cardiovascular: Normal rate and regular rhythm.  Pulmonary/Chest: Effort normal.  Abdominal: Soft. She exhibits no distension. There is no tenderness.  Musculoskeletal:       Lumbar back: She exhibits decreased range of motion and tenderness. She exhibits no bony tenderness and normal pulse.  Right hip: Nontender full range of motion  Neurological: She is alert and oriented to person, place, and time. She displays normal reflexes. No sensory deficit. She exhibits normal muscle tone. Coordination normal.  Skin: Skin is warm and dry. Capillary refill takes less than 2 seconds.  Psychiatric: She has a normal mood and affect. Her behavior is normal.  Vitals reviewed.  A total of 25  minutes was spent in the room with the patient, greater than 50% of which was in counseling/coordination of care regarding differential diagnosis, treatment, medications and need for follow-up if no better or worse.  ASSESSMENT & PLAN: Xylina was seen today for hip pain.  Diagnoses and all orders for this visit:  Sciatica of right side -     ketorolac (TORADOL) injection 60 mg -     HYDROcodone-acetaminophen (NORCO) 5-325 MG tablet; Take 1 tablet by mouth every 6 (six) hours as needed for moderate pain.    Patient Instructions       IF you received an x-ray today, you will receive an invoice from [facility]. Please contact [facility] at [phone] with questions or concerns regarding your invoice.   IF you received labwork today, you will receive an invoice from [place]. Please contact [facility] at [phone] with questions or concerns regarding your invoice.   Our billing staff will not be able to assist you with questions regarding bills from these companies.  You will be contacted with the lab results as soon as they are available. The fastest way to get your results is to activate your My Chart account. Instructions are located on the last page of this paperwork. If you have not heard from [place] regarding the results in 2 weeks, please contact this office.     Sciatica Sciatica is pain, numbness, weakness, or tingling along your sciatic nerve. The sciatic nerve starts in the lower back and goes down the back of each leg. Sciatica happens when this nerve is pinched or has pressure put on it. Sciatica usually goes away on its own or with treatment. Sometimes, sciatica may keep coming back (recur). Follow these instructions at home: Medicines  Take over-the-counter and prescription medicines only as told by your doctor.  Do not drive or use heavy machinery while taking prescription pain medicine. Managing pain  If directed, put ice on the affected  area. ? Put ice in a plastic bag. ? Place a towel between your skin and the bag. ? Leave the ice on for 20 minutes, 2-3 times a day.  After icing, apply heat to the affected area before you exercise or as often as told by your doctor. Use the heat source that your  doctor tells you to use, such as a moist heat pack or a heating pad. ? Place a towel between your skin and the heat source. ? Leave the heat on for 20-30 minutes. ? Remove the heat if your skin turns bright red. This is especially important if you are unable to feel pain, heat, or cold. You may have a greater risk of getting burned. Activity  Return to your normal activities as told by your doctor. Ask your doctor what activities are safe for you. ? Avoid activities that make your sciatica worse.  Take short rests during the day. Rest in a lying or standing position. This is usually better than sitting to rest. ? When you rest for a long time, do some physical activity or stretching between periods of rest. ? Avoid sitting for a long time without moving. Get up and move around at least one time each hour.  Exercise and stretch regularly, as told by your doctor.  Do not lift anything that is heavier than 10 lb (4.5 kg) while you have symptoms of sciatica. ? Avoid lifting heavy things even when you do not have symptoms. ? Avoid lifting heavy things over and over.  When you lift objects, always lift in a way that is safe for your body. To do this, you should: ? Bend your knees. ? Keep the object close to your body. ? Avoid twisting. General instructions  Use good posture. ? Avoid leaning forward when you are sitting. ? Avoid hunching over when you are standing.  Stay at a healthy weight.  Wear comfortable shoes that support your feet. Avoid wearing high heels.  Avoid sleeping on a mattress that is too soft or too hard. You might have less pain if you sleep on a mattress that is firm enough to support your back.  Keep  all follow-up visits as told by your doctor. This is important. Contact a doctor if:  You have pain that: ? Wakes you up when you are sleeping. ? Gets worse when you lie down. ? Is worse than the pain you have had in the past. ? Lasts longer than 4 weeks.  You lose weight for without trying. Get help right away if:  You cannot control when you pee (urinate) or poop (have a bowel movement).  You have weakness in any of these areas and it gets worse. ? Lower back. ? Lower belly (pelvis). ? Butt (buttocks). ? Legs.  You have redness or swelling of your back.  You have a burning feeling when you pee. This information is not intended to replace advice given to you by your health care provider. Make sure you discuss any questions you have with your health care provider. Document Released: 10/22/2007 Document Revised: 06/20/2015 Document Reviewed: 09/21/2014 Elsevier Interactive Patient Education  2018 Elsevier Inc.      Agustina Caroli, MD [facility]

## 2017-06-19 NOTE — Patient Instructions (Addendum)
IF you received an x-ray today, you will receive an invoice from [facility]. Please contact [facility] at [phone] with questions or concerns regarding your invoice.   IF you received labwork today, you will receive an invoice from [facility]. Please contact [facility] at [phone] with questions or concerns regarding your invoice.   Our billing staff will not be able to assist you with questions regarding bills from these companies.  You will be contacted with the lab results as soon as they are available. The fastest way to get your results is to activate your My Chart account. Instructions are located on the last page of this paperwork. If you have not heard from [place] regarding the results in 2 weeks, please contact this office.     Sciatica Sciatica is pain, numbness, weakness, or tingling along your sciatic nerve. The sciatic nerve starts in the lower back and goes down the back of each leg. Sciatica happens when this nerve is pinched or has pressure put on it. Sciatica usually goes away on its own or with treatment. Sometimes, sciatica may keep coming back (recur). Follow these instructions at home: Medicines  Take over-the-counter and prescription medicines only as told by your doctor.  Do not drive or use heavy machinery while taking prescription pain medicine. Managing pain  If directed, put ice on the affected area. ? Put ice in a plastic bag. ? Place a towel between your skin and the bag. ? Leave the ice on for 20 minutes, 2-3 times a day.  After icing, apply heat to the affected area before you exercise or as often as told by your doctor. Use the heat source that your doctor tells you to use, such as a moist heat pack or a heating pad. ? Place a towel between your skin and the heat source. ? Leave the heat on for 20-30 minutes. ? Remove the heat if your skin turns bright red. This is especially important if you are unable to feel pain, heat, or  cold. You may have a greater risk of getting burned. Activity  Return to your normal activities as told by your doctor. Ask your doctor what activities are safe for you. ? Avoid activities that make your sciatica worse.  Take short rests during the day. Rest in a lying or standing position. This is usually better than sitting to rest. ? When you rest for a long time, do some physical activity or stretching between periods of rest. ? Avoid sitting for a long time without moving. Get up and move around at least one time each hour.  Exercise and stretch regularly, as told by your doctor.  Do not lift anything that is heavier than 10 lb (4.5 kg) while you have symptoms of sciatica. ? Avoid lifting heavy things even when you do not have symptoms. ? Avoid lifting heavy things over and over.  When you lift objects, always lift in a way that is safe for your body. To do this, you should: ? Bend your knees. ? Keep the object close to your body. ? Avoid twisting. General instructions  Use good posture. ? Avoid leaning forward when you are sitting. ? Avoid hunching over when you are standing.  Stay at a healthy weight.  Wear comfortable shoes that support your feet. Avoid wearing high heels.  Avoid sleeping on a mattress that is too soft or too hard. You might have less pain if you sleep on a mattress that is firm enough  to support your back.  Keep all follow-up visits as told by your doctor. This is important. Contact a doctor if:  You have pain that: ? Wakes you up when you are sleeping. ? Gets worse when you lie down. ? Is worse than the pain you have had in the past. ? Lasts longer than 4 weeks.  You lose weight for without trying. Get help right away if:  You cannot control when you pee (urinate) or poop (have a bowel movement).  You have weakness in any of these areas and it gets worse. ? Lower back. ? Lower belly (pelvis). ? Butt (buttocks). ? Legs.  You have redness  or swelling of your back.  You have a burning feeling when you pee. This information is not intended to replace advice given to you by your health care provider. Make sure you discuss any questions you have with your health care provider. Document Released: 10/22/2007 Document Revised: 06/20/2015 Document Reviewed: 09/21/2014 Elsevier Interactive Patient Education  [employer].

## 2017-06-22 ENCOUNTER — Other Ambulatory Visit: Payer: Self-pay | Admitting: Emergency Medicine

## 2017-06-22 ENCOUNTER — Telehealth: Payer: Self-pay | Admitting: Emergency Medicine

## 2017-06-22 ENCOUNTER — Ambulatory Visit
Admission: RE | Admit: 2017-06-22 | Discharge: 2017-06-22 | Disposition: A | Discharge status: Home / Self Care | Payer: Medicare Other | Source: Ambulatory Visit | Attending: Family Medicine | Admitting: Family Medicine

## 2017-06-22 DIAGNOSIS — Z1231 Encounter for screening mammogram for malignant neoplasm of breast: Secondary | ICD-10-CM

## 2017-06-22 DIAGNOSIS — M5431 Sciatica, right side: Secondary | ICD-10-CM

## 2017-06-22 NOTE — Telephone Encounter (Signed)
Will order MRI but patient may need to see Orthopedist first. Ortho consult requested as well. Thanks.

## 2017-06-22 NOTE — Telephone Encounter (Signed)
Copied from [place] [phone]. Topic: Referral - Request >> Jun 22, 2017  8:20 AM Lennox Solders wrote: Reason for CRM:pt saw dr sagardia on 06-19-17 and would like to proceed with the MRI of sciatica. Pt is unavailable  this morning  Please advise

## 2017-06-22 NOTE — Telephone Encounter (Signed)
Pt husband called and stated that instead of going to an ortho could she get a referral for [place] [facility] for a consult. Thanks

## 2017-06-23 ENCOUNTER — Other Ambulatory Visit: Payer: Self-pay | Admitting: Emergency Medicine

## 2017-06-23 ENCOUNTER — Telehealth: Payer: Self-pay | Admitting: Emergency Medicine

## 2017-06-23 ENCOUNTER — Ambulatory Visit
Admission: RE | Admit: 2017-06-23 | Discharge: 2017-06-23 | Disposition: A | Discharge status: Home / Self Care | Payer: Medicare Other | Source: Ambulatory Visit | Attending: Emergency Medicine | Admitting: Emergency Medicine

## 2017-06-23 DIAGNOSIS — M48061 Spinal stenosis, lumbar region without neurogenic claudication: Secondary | ICD-10-CM | POA: Diagnosis not present

## 2017-06-23 DIAGNOSIS — M5431 Sciatica, right side: Secondary | ICD-10-CM

## 2017-06-23 NOTE — Progress Notes (Signed)
Letter sent via My Chart.

## 2017-06-23 NOTE — Telephone Encounter (Signed)
Copied from [place] [id number]. Topic: Quick Communication - See Telephone Encounter >> Jun 23, 2017 12:24 PM Vernona Rieger wrote: CRM for notification. See Telephone encounter for: 06/23/17.  Patient would like a nurse to call her back and explain to her the results she already received from her MRI today. She needs to know what she further needs to do. Call back @ [phone]

## 2017-06-24 NOTE — Telephone Encounter (Signed)
Pt would like a call asap with her MRI results. States she is in a lot of pain from her sciatic nerve and cannot get in to the orthopedic doctor until next Thursday and has no pain medication to help with the pain. Did schedule her with Dr. Mitchel Honour for tomorrow.

## 2017-06-25 ENCOUNTER — Encounter: Payer: Self-pay | Admitting: Emergency Medicine

## 2017-06-25 ENCOUNTER — Ambulatory Visit (INDEPENDENT_AMBULATORY_CARE_PROVIDER_SITE_OTHER): Payer: Medicare Other | Admitting: Emergency Medicine

## 2017-06-25 VITALS — BP 114/77 | HR 90 | Temp 98.4°F | Resp 17 | Ht 62.0 in | Wt 198.0 lb

## 2017-06-25 DIAGNOSIS — M545 Low back pain, unspecified: Secondary | ICD-10-CM

## 2017-06-25 DIAGNOSIS — M48061 Spinal stenosis, lumbar region without neurogenic claudication: Secondary | ICD-10-CM | POA: Diagnosis not present

## 2017-06-25 DIAGNOSIS — M5430 Sciatica, unspecified side: Secondary | ICD-10-CM | POA: Insufficient documentation

## 2017-06-25 DIAGNOSIS — M4726 Other spondylosis with radiculopathy, lumbar region: Secondary | ICD-10-CM

## 2017-06-25 MED ORDER — MELOXICAM 15 MG PO TABS
15.0000 mg | ORAL_TABLET | Freq: Every day | ORAL | 0 refills | Status: DC
Start: 2017-06-25 — End: 2017-09-25

## 2017-06-25 MED ORDER — TRAMADOL HCL 50 MG PO TABS: 50.0000 mg | ORAL_TABLET | Freq: Three times a day (TID) | ORAL | 0 refills | Status: DC | PRN

## 2017-06-25 NOTE — Progress Notes (Signed)
Lori Mooney 72 y.o.   Chief Complaint  Patient presents with  . sciatic nerve pain    back had mri wants results     HISTORY OF PRESENT ILLNESS: [name] complaining of low back pain with radiation down the back of the right leg.  Has been going on for several weeks. Recent MRI showed diffuse multilevel degenerative changes.  Radiologist report discussed with patient and husband.  Refer to Ortho.  Has appointment for next Friday, 1 week from today.  Has no bladder or bowel problems.  Seen by me 06/19/2017.  Still has ongoing pain.  Has been taking Tylenol and hydrocodone with partial relief.  No new symptomatology just not getting a whole lot better. Mr Lumbar Spine Wo Contrast  Result Date: 06/23/2017 CLINICAL DATA:  Sciatic on the right. EXAM: MRI LUMBAR SPINE WITHOUT CONTRAST TECHNIQUE: Multiplanar, multisequence MR imaging of the lumbar spine was performed. No intravenous contrast was administered. COMPARISON:  None. FINDINGS: Segmentation:  Standard Alignment: Grade 1 anterolisthesis at L3-4, facet mediated. Slight retrolisthesis at L5-S1 Vertebrae:  Degenerative type endplate edematous signal at L5-S1 Conus medullaris and cauda equina: Conus extends to the T12-L1 level. Conus appears normal. There is nerve root redundancy from L3-4 spinal stenosis. Minimal fat deposition within the filum terminalis. Paraspinal and other soft tissues: Negative Disc levels: T12- L1: Unremarkable. L1-L2: Mild disc narrowing and bulging L2-L3: Facet degeneration with asymmetric left-sided spurring. The disc is narrowed and bulging. Left subarticular recess stenosis with left L3 impingement. Patent foramina L3-L4: Facet arthropathy with anterolisthesis and hypertrophy. The disc is narrowed and bulging with endplate ridging. High-grade spinal stenosis. There is near complete effacement of CSF and the neighboring nerve roots are redundant. Patent foramina L4-L5: Disc narrowing and bulging with  degenerative posterior element hypertrophy. Spinal stenosis is moderate. Moderate right foraminal narrowing. L5-S1:Greatest level of degenerative disc narrowing with bulging of the residual disc and endplate spurring. Mild facet spurring. The canal is patent. Moderate right more than left foraminal narrowing IMPRESSION: 1. Advanced multilevel degenerative disease as described. 2. Spinal stenosis is advanced at L3-4 and moderate to advanced at L4-5. 3. Moderate foraminal narrowing on the right at L4-5 and right more than left at L5-S1. 4. L2-3 left subarticular recess stenosis which could affect the left L3 nerve root. Electronically Signed   By: Monte Fantasia M.D.   On: 06/23/2017 10:14    HPI   Prior to Admission medications   Medication Sig Start Date End Date Taking? Authorizing Provider  atorvastatin (LIPITOR) 20 MG tablet Take 1 tablet (20 mg total) by mouth daily. 09/01/16  Yes Shawnee Knapp, MD  Cholecalciferol (VITAMIN D-3) 1000 UNITS CAPS Take 1,000 Units by mouth daily.   Yes [provider]  Cyanocobalamin (VITAMIN B12 PO) Take 1 tablet by mouth daily. Reported on 06/20/2015   Yes [provider]  ibuprofen (ADVIL,MOTRIN) 200 MG tablet Take 400 mg by mouth every 6 (six) hours as needed for pain.   Yes [provider]  lisinopril-hydrochlorothiazide (PRINZIDE,ZESTORETIC) 20-12.5 MG tablet Take 1 tablet by mouth daily. 07/20/16  Yes Shawnee Knapp, MD  loratadine (CLARITIN) 10 MG tablet Take 10 mg by mouth daily.   Yes [provider]  Magnesium 250 MG TABS Take 250-500 mg by mouth at bedtime. 250 mg for sleep and 500 mg for constipation   Yes [provider]  meclizine (ANTIVERT) 12.5 MG tablet Take 1 tablet (12.5 mg total) by mouth 3 (three)  times daily as needed for dizziness. 02/26/14  Yes [name], MD  Ranitidine HCl (ZANTAC PO) Take 1 tablet by mouth daily as needed (Takes one tablet by mouth for GERD). For heartburn   Yes [provider]    Allergies  Allergen Reactions  . Shellfish Allergy Hives  . Strawberry Extract Hives    Patient Active Problem List   Diagnosis Date Noted  . Sciatica of right side 06/19/2017  . GERD (gastroesophageal reflux disease) 09/19/2013  . Vertigo 03/02/2012  . Hypercholesterolemia 10/08/2010  . Benign hypertensive heart disease without heart failure 10/08/2010  . Obesity 10/08/2010    Past Medical History:  Diagnosis Date  . Allergy   . 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 Cataract   . Exogenous obesity   . GERD (gastroesophageal reflux disease)   . Hyperlipidemia    hypercholesterolemia  . Hypertension     Past Surgical History:  Procedure Laterality Date  . CATARACT EXTRACTION W/ INTRAOCULAR LENS IMPLANT Left   . DILITATION & CURRETTAGE/HYSTROSCOPY WITH VERSAPOINT RESECTION N/A 08/25/2012   Procedure: DILATATION & CURETTAGE/HYSTEROSCOPY WITH VERSAPOINT RESECTION;  Surgeon: Princess Bruins, MD;  Location: [facility];  Service: Gynecology;  Laterality: N/A;  . EYE SURGERY    . TONSILLECTOMY    . TUBAL LIGATION      Social History   Socioeconomic History  . Marital status: Married    Spouse name: Not on file  . Number of children: Not on file  . Years of education: Not on file  . Highest education level: Not on file  Occupational History  . Occupation: Retired Animal nutritionist  . Financial resource strain: Not on file  . Food insecurity:    Worry: Not on file    Inability: Not on file  . Transportation needs:    Medical: Not on file    Non-medical: Not on file  Tobacco Use  . Smoking status: Never Smoker  . Smokeless tobacco: Never Used  Substance and Sexual Activity  . Alcohol use: No    Alcohol/week: 0.0 oz  . Drug use: No  . Sexual activity: Yes  Lifestyle  . Physical activity:    Days per week: Not on file    Minutes per session: Not on file  . Stress: Not on file  Relationships  . Social connections:    Talks on phone: Not on  file    Gets together: Not on file    Attends religious service: Not on file    Active member of club or organization: Not on file    Attends meetings of clubs or organizations: Not on file    Relationship status: Not on file  . Intimate partner violence:    Fear of current or ex partner: Not on file    Emotionally abused: Not on file    Physically abused: Not on file    Forced sexual activity: Not on file  Other Topics Concern  . Not on file  Social History Narrative   Married. Education: [employer].    Family History  Problem Relation Age of Onset  . Hyperlipidemia Mother   . Heart disease Mother   . Hypertension Mother   . Hypertension Father   . Hyperlipidemia Father   . Hyperlipidemia Sister   . Hypertension Sister      Review of Systems  Constitutional: Negative.  Negative for chills and fever.  Respiratory: Negative for shortness of breath.   Gastrointestinal:  Negative for abdominal pain, nausea and vomiting.  Genitourinary: Negative for dysuria and hematuria.  Skin: Negative.  Negative for rash.  Neurological: Negative.  Negative for tingling, sensory change, focal weakness and weakness.   Vitals:   06/25/17 1208  BP: 114/77  Pulse: 90  Resp: 17  Temp: 98.4 F (36.9 C)  SpO2: 98%     Physical Exam  Constitutional: She is oriented to person, place, and time. She appears well-developed and well-nourished.  HENT:  Head: Normocephalic and atraumatic.  Neck: Normal range of motion.  Cardiovascular: Normal rate.  Pulmonary/Chest: Effort normal.  Neurological: She is alert and oriented to person, place, and time. No sensory deficit. She exhibits normal muscle tone. Coordination normal.  Skin: Skin is warm and dry. Capillary refill takes less than 2 seconds.  Psychiatric: She has a normal mood and affect. Her behavior is normal.  Vitals reviewed.    ASSESSMENT & PLAN: Lori Mooney was seen today for sciatic nerve pain.  Diagnoses and all orders for this  visit:  Osteoarthritis of spine with radiculopathy, lumbar region  Lumbar pain -     meloxicam (MOBIC) 15 MG tablet; Take 1 tablet (15 mg total) by mouth daily. -     traMADol (ULTRAM) 50 MG tablet; Take 1 tablet (50 mg total) by mouth every 8 (eight) hours as needed for severe pain.  Spinal stenosis of lumbar region without neurogenic claudication     Patient Instructions       IF you received an x-ray today, you will receive an invoice from [facility]. Please contact [facility] at [phone] with questions or concerns regarding your invoice.   IF you received labwork today, you will receive an invoice from [facility]. Please contact [facility] at [phone] with questions or concerns regarding your invoice.   Our billing staff will not be able to assist you with questions regarding bills from these companies.  You will be contacted with the lab results as soon as they are available. The fastest way to get your results is to activate your My Chart account. Instructions are located on the last page of this paperwork. If you have not heard from [place] regarding the results in 2 weeks, please contact this office.      Back Pain, Adult Back pain is very common. The pain often gets better over time. The cause of back pain is usually not dangerous. Most people can learn to manage their back pain on their own. Follow these instructions at home: Watch your back pain for any changes. The following actions may help to lessen any pain you are feeling:  Stay active. Start with short walks on flat ground if you can. Try to walk farther each day.  Exercise regularly as told by your doctor. Exercise helps your back heal faster. It also helps avoid future injury by keeping your muscles strong and flexible.  Do not sit, drive, or stand in one place for more than 30 minutes.  Do not stay in bed. Resting more than 1-2 days can slow down your recovery.  Be careful when you  bend or lift an object. Use good form when lifting: ? Bend at your knees. ? Keep the object close to your body. ? Do not twist.  Sleep on a firm mattress. Lie on your side, and bend your knees. If you lie on your back, put a pillow under your knees.  Take medicines only as told by your doctor.  Put ice on the injured area. ?  Put ice in a plastic bag. ? Place a towel between your skin and the bag. ? Leave the ice on for 20 minutes, 2-3 times a day for the first 2-3 days. After that, you can switch between ice and heat packs.  Avoid feeling anxious or stressed. Find good ways to deal with stress, such as exercise.  Maintain a healthy weight. Extra weight puts stress on your back.  Contact a doctor if:  You have pain that does not go away with rest or medicine.  You have worsening pain that goes down into your legs or buttocks.  You have pain that does not get better in one week.  You have pain at night.  You lose weight.  You have a fever or chills. Get help right away if:  You cannot control when you poop (bowel movement) or pee (urinate).  Your arms or legs feel weak.  Your arms or legs lose feeling (numbness).  You feel sick to your stomach (nauseous) or throw up (vomit).  You have belly (abdominal) pain.  You feel like you may pass out (faint). This information is not intended to replace advice given to you by your health care provider. Make sure you discuss any questions you have with your health care provider. Document Released: 07/01/2007 Document Revised: 06/20/2015 Document Reviewed: 05/16/2013 Elsevier Interactive Patient Education  2018 Elsevier Inc.      Agustina Caroli, MD [facility]

## 2017-06-25 NOTE — Patient Instructions (Addendum)
     IF you received an x-ray today, you will receive an invoice from [facility]. Please contact [facility] at [phone] with questions or concerns regarding your invoice.   IF you received labwork today, you will receive an invoice from [facility]. Please contact [facility] at [phone] with questions or concerns regarding your invoice.   Our billing staff will not be able to assist you with questions regarding bills from these companies.  You will be contacted with the lab results as soon as they are available. The fastest way to get your results is to activate your My Chart account. Instructions are located on the last page of this paperwork. If you have not heard from us regarding the results in 2 weeks, please contact this office.      Back Pain, Adult Back pain is very common. The pain often gets better over time. The cause of back pain is usually not dangerous. Most people can learn to manage their back pain on their own. Follow these instructions at home: Watch your back pain for any changes. The following actions may help to lessen any pain you are feeling:  Stay active. Start with short walks on flat ground if you can. Try to walk farther each day.  Exercise regularly as told by your doctor. Exercise helps your back heal faster. It also helps avoid future injury by keeping your muscles strong and flexible.  Do not sit, drive, or stand in one place for more than 30 minutes.  Do not stay in bed. Resting more than 1-2 days can slow down your recovery.  Be careful when you bend or lift an object. Use good form when lifting: ? Bend at your knees. ? Keep the object close to your body. ? Do not twist.  Sleep on a firm mattress. Lie on your side, and bend your knees. If you lie on your back, put a pillow under your knees.  Take medicines only as told by your doctor.  Put ice on the injured area. ? Put ice in a plastic bag. ? Place a towel between your  skin and the bag. ? Leave the ice on for 20 minutes, 2-3 times a day for the first 2-3 days. After that, you can switch between ice and heat packs.  Avoid feeling anxious or stressed. Find good ways to deal with stress, such as exercise.  Maintain a healthy weight. Extra weight puts stress on your back.  Contact a doctor if:  You have pain that does not go away with rest or medicine.  You have worsening pain that goes down into your legs or buttocks.  You have pain that does not get better in one week.  You have pain at night.  You lose weight.  You have a fever or chills. Get help right away if:  You cannot control when you poop (bowel movement) or pee (urinate).  Your arms or legs feel weak.  Your arms or legs lose feeling (numbness).  You feel sick to your stomach (nauseous) or throw up (vomit).  You have belly (abdominal) pain.  You feel like you may pass out (faint). This information is not intended to replace advice given to you by your health care provider. Make sure you discuss any questions you have with your health care provider. Document Released: 07/01/2007 Document Revised: 06/20/2015 Document Reviewed: 05/16/2013 Elsevier Interactive Patient Education  2018 Elsevier Inc.  

## 2017-07-02 ENCOUNTER — Ambulatory Visit (INDEPENDENT_AMBULATORY_CARE_PROVIDER_SITE_OTHER): Payer: Medicare Other | Admitting: Orthopaedic Surgery

## 2017-07-02 ENCOUNTER — Encounter (INDEPENDENT_AMBULATORY_CARE_PROVIDER_SITE_OTHER): Payer: Self-pay | Admitting: Orthopaedic Surgery

## 2017-07-02 VITALS — BP 115/79 | HR 81 | Ht 62.0 in | Wt 198.0 lb

## 2017-07-02 DIAGNOSIS — G8929 Other chronic pain: Secondary | ICD-10-CM | POA: Diagnosis not present

## 2017-07-02 DIAGNOSIS — M5441 Lumbago with sciatica, right side: Secondary | ICD-10-CM

## 2017-07-02 NOTE — Progress Notes (Signed)
Office Visit Note   Patient: Lori Mooney           Date of Birth: 1945-10-02           MRN: [id number] Visit Date: 07/02/2017              Requested by: Horald Pollen, MD [place], [place] [address] PCP: Shawnee Knapp, MD   Assessment & Plan: Visit Diagnoses:  1. Chronic midline low back pain with right-sided sciatica     Plan: Low back pain with right lower extremity radiculopathy and symptoms consistent with claudication.  MRI scan reveals stenosis at L3-4 and L4-5.  Long discussion over 45 minutes with Lori Mooney regarding the diagnosis, MRI scan findings and treatment options.  At this point I would suggest an epidural steroid injection and then follow-up in 1 month.  We may consider physical therapy.  Taking the appropriate medicines including NSAID and tramadol  Follow-Up Instructions: Return in about 1 month (around 08/01/2017).   Orders:  Orders Placed This Encounter  Procedures  . Ambulatory referral to Physical Medicine Rehab   No orders of the defined types were placed in this encounter.     Procedures: No procedures performed   Clinical Data: No additional findings.   Subjective: Chief Complaint  Patient presents with  . Right Leg - Pain  . Right Hip - Pain  . Lower Back - Pain  . New Patient (Initial Visit)    RIGHT LEG PAIN FOR 2 WEEKS, NO BACK PAIN, NO INJURY, NO INJECTIONS OR SURGERIES  Lori Mooney is accompanied by her husband and here for evaluation of back pain associated with right lower extremity radiculopathy.  She experienced insidious onset of low back and right buttock pain within the past month.  She actually thinks the pain may have started in the area of her right groin and then radiated posteriorly.  Now the pain is predominantly in the area of the buttock and right thigh.  No history of injury or trauma.  Is fine when she is lying down.  She has not had any injury or trauma.  She has had some pain when she coughs and  when she is out of bed particularly when she stands for a length of time or walks for any length of time.  Her primary care physician has ordered meloxicam and tramadol which seems to make a difference.  She and her husband are planning a trip next month and she is obviously concerned about her ability to walk and function.  An MRI scan formed on 06/23/2017 of the lumbar spine.  I have a copy of the report which I have reviewed.  She has significant findings at L3-4 and L4-5 where there is facet arthropathy and disc space narrowing.  She has high-grade stenosis at L3-4 and moderate spinal stenosis at L4-5.  Is not experiencing any left lower extremity pain.  She is not abuse weakness.  No bowel or bladder dysfunction.  No prior back surgery.  HPI  Review of Systems  Constitutional: Negative for fatigue and fever.  HENT: Negative for ear pain.   Eyes: Negative for pain.  Respiratory: Negative for cough and shortness of breath.   Cardiovascular: Positive for leg swelling.  Gastrointestinal: Negative for constipation and diarrhea.  Genitourinary: Negative for difficulty urinating.  Musculoskeletal: Positive for back pain. Negative for neck pain.  Skin: Negative for rash.  Allergic/Immunologic: Positive for food allergies.  Neurological: Positive for weakness and  numbness.  Hematological: Does not bruise/bleed easily.  Psychiatric/Behavioral: Negative for sleep disturbance.     Objective: Vital Signs: BP 115/79 (BP Location: Left Arm, Patient Position: Sitting, Cuff Size: Normal)   Pulse 81   Ht 5\' 2"  (1.575 m)   Wt 198 lb (89.8 kg)   BMI 36.21 kg/m   Physical Exam  Constitutional: She is oriented to person, place, and time. She appears well-developed and well-nourished.  HENT:  Mouth/Throat: Oropharynx is clear and moist.  Eyes: Pupils are equal, round, and reactive to light. EOM are normal.  Pulmonary/Chest: Effort normal.  Neurological: She is alert and oriented to person, place, and  time.  Skin: Skin is warm and dry.  Psychiatric: She has a normal mood and affect. Her behavior is normal.    Ortho Exam awake alert and oriented x3.  Comfortable sitting.  No ambulatory aid.  Straight leg raise is negative bilaterally.  Painless range of motion of both hips and both knees.  Reflexes symmetrical.  No distal edema.  Motor and sensory exam intact.  No percussible tenderness of lumbar spine.  No local tenderness in the area of her lateral right hip or buttock.  Skin intact. Specialty Comments:  No specialty comments available.  Imaging: No results found.   PMFS History: Patient Active Problem List   Diagnosis Date Noted  . Osteoarthritis of spine with radiculopathy, lumbar region 06/25/2017  . Lumbar pain 06/25/2017  . Spinal stenosis of lumbar region without neurogenic claudication 06/25/2017  . Sciatica of right side 06/19/2017  . GERD (gastroesophageal reflux disease) 09/19/2013  . Vertigo 03/02/2012  . Hypercholesterolemia 10/08/2010  . Benign hypertensive heart disease without heart failure 10/08/2010  . Obesity 10/08/2010   Past Medical History:  Diagnosis Date  . Allergy   . 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 Cataract   . Exogenous obesity   . GERD (gastroesophageal reflux disease)   . Hyperlipidemia    hypercholesterolemia  . Hypertension     Family History  Problem Relation Age of Onset  . Hyperlipidemia Mother   . Heart disease Mother   . Hypertension Mother   . Hypertension Father   . Hyperlipidemia Father   . Hyperlipidemia Sister   . Hypertension Sister     Past Surgical History:  Procedure Laterality Date  . CATARACT EXTRACTION W/ INTRAOCULAR LENS IMPLANT Left   . DILITATION & CURRETTAGE/HYSTROSCOPY WITH VERSAPOINT RESECTION N/A 08/25/2012   Procedure: DILATATION & CURETTAGE/HYSTEROSCOPY WITH VERSAPOINT RESECTION;  Surgeon: Princess Bruins, MD;  Location: [facility];  Service: Gynecology;  Laterality: N/A;  . EYE SURGERY     . TONSILLECTOMY    . TUBAL LIGATION     Social History   Occupational History  . Occupation: Retired Therapist, sports  Tobacco Use  . Smoking status: Never Smoker  . Smokeless tobacco: Never Used  Substance and Sexual Activity  . Alcohol use: No    Alcohol/week: 0.0 oz  . Drug use: No  . Sexual activity: Yes

## 2017-07-05 ENCOUNTER — Other Ambulatory Visit: Payer: Self-pay | Admitting: Radiology

## 2017-07-05 DIAGNOSIS — G8929 Other chronic pain: Secondary | ICD-10-CM

## 2017-07-05 DIAGNOSIS — M545 Low back pain: Principal | ICD-10-CM

## 2017-07-12 ENCOUNTER — Telehealth (INDEPENDENT_AMBULATORY_CARE_PROVIDER_SITE_OTHER): Payer: Self-pay | Admitting: Orthopaedic Surgery

## 2017-07-12 NOTE — Telephone Encounter (Signed)
Please advise 

## 2017-07-12 NOTE — Telephone Encounter (Signed)
Patient calling in reference to back pain. Patients severe pain that she had for 3 weeks is now subsiding. Patient is scheduled for back injection at [facility] Wednesday. Patient not sure if she should keep this appt. Patient also still has an appt with Dr. Ernestina Patches scheduled for 7/10, and would like to know if she should keep that appt just in case ? Please call to advise.

## 2017-07-12 NOTE — Telephone Encounter (Signed)
Would not suggest any injections if better-coyuld reschedule if pain exacerbates-please call

## 2017-07-13 ENCOUNTER — Other Ambulatory Visit (INDEPENDENT_AMBULATORY_CARE_PROVIDER_SITE_OTHER): Payer: Self-pay | Admitting: Radiology

## 2017-07-13 DIAGNOSIS — M545 Low back pain: Principal | ICD-10-CM

## 2017-07-13 DIAGNOSIS — G8929 Other chronic pain: Secondary | ICD-10-CM

## 2017-07-13 NOTE — Telephone Encounter (Signed)
Please refer to PT. Thank you.

## 2017-07-13 NOTE — Telephone Encounter (Signed)
yes

## 2017-07-13 NOTE — Telephone Encounter (Signed)
SENT PT REFERRAL IN.

## 2017-07-13 NOTE — Telephone Encounter (Signed)
OK for PT

## 2017-07-13 NOTE — Telephone Encounter (Signed)
Patient advised she can cancel injection appts at this time. Patient would like to know if doctor recommends Physical Therapy, or if he has exercises she can do to strengthen that area of her back. Please call to advise.

## 2017-07-14 ENCOUNTER — Other Ambulatory Visit: Payer: Medicare Other

## 2017-07-20 ENCOUNTER — Ambulatory Visit: Payer: Medicare Other | Attending: Orthopaedic Surgery | Admitting: Physical Therapy

## 2017-07-20 ENCOUNTER — Other Ambulatory Visit: Payer: Self-pay

## 2017-07-20 DIAGNOSIS — M25651 Stiffness of right hip, not elsewhere classified: Secondary | ICD-10-CM | POA: Diagnosis not present

## 2017-07-20 DIAGNOSIS — M5440 Lumbago with sciatica, unspecified side: Secondary | ICD-10-CM

## 2017-07-20 DIAGNOSIS — M6281 Muscle weakness (generalized): Secondary | ICD-10-CM

## 2017-07-20 DIAGNOSIS — R208 Other disturbances of skin sensation: Secondary | ICD-10-CM

## 2017-07-20 NOTE — Therapy (Signed)
[place], [place], [address] Phone: [phone]   Fax:  [phone]  Physical Therapy Evaluation  Patient Details  Name: Lori Mooney MRN: [id number] Date of Birth: 11-Feb-1945 Referring Provider: Dr. Joni Fears    Encounter Date: 07/20/2017  PT End of Session - 07/20/17 1820    Visit Number  1    Number of Visits  8    Date for PT Re-Evaluation  08/31/17    PT Start Time  1330    PT Stop Time  1420    PT Time Calculation (min)  50 min    Activity Tolerance  Patient tolerated treatment well    Behavior During Therapy  [facility] for tasks assessed/performed       Past Medical History:  Diagnosis Date  . Allergy   . 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 Cataract   . Exogenous obesity   . GERD (gastroesophageal reflux disease)   . Hyperlipidemia    hypercholesterolemia  . Hypertension     Past Surgical History:  Procedure Laterality Date  . CATARACT EXTRACTION W/ INTRAOCULAR LENS IMPLANT Left   . DILITATION & CURRETTAGE/HYSTROSCOPY WITH VERSAPOINT RESECTION N/A 08/25/2012   Procedure: DILATATION & CURETTAGE/HYSTEROSCOPY WITH VERSAPOINT RESECTION;  Surgeon: Princess Bruins, MD;  Location: [facility];  Service: Gynecology;  Laterality: N/A;  . EYE SURGERY    . TONSILLECTOMY    . TUBAL LIGATION      There were no vitals filed for this visit.   Subjective Assessment - 07/20/17 1339    Subjective  Patient began to have pain in Rt side of low back and hip about 5 weeks ago.  The pain as severe and she had difficulty walking.  She saddle anesthesia at that time as well. She was unable to sit, and standing and transitions  were difficult.   Her pain has resolved almost fully.  She cancelled her ESI last week.  Prior she was walking and doing Low impact aerobics.  She would like to be able to return to a normal fitness routine without exacerbating her pain.     Pertinent History  L  shoulder impingment, knee issues, osteoarthritis, HTN    Limitations  Lifting;Standing;Walking;House hold activities;Other (comment)    How long can you sit comfortably?  currently she can do all these things without much difficult    Diagnostic tests  MRI 06/23/17 showed Spinal stenosis is advanced at L3-4 and moderate to advanced at L4-L5. Moderate foraminal narrowing on the right at L4-5.    Patient Stated Goals  Patient wants to be able exercise safely and learn how to prevent further issues     Currently in Pain?  No/denies had mild yesterday     Pain Score  0-No pain    Pain Location  Hip and back     Pain Orientation  Right    Pain Descriptors / Indicators  Aching was sharp     Pain Type  Acute pain    Pain Onset  More than a month ago    Pain Frequency  Intermittent    Aggravating Factors   unsure     Pain Relieving Factors  medicine, heating pad, rest     Effect of Pain on Daily Activities  self limits          [facility] PT Assessment - 07/20/17 0001      Assessment   Medical Diagnosis  acute low back  pain     Referring Provider  Dr. 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    Leisure  travel, family husband also active and retired      Associate Professor   Overall Cognitive Status  Within Functional Limits for tasks assessed       Observation/Other Assessments   Focus on Therapeutic Outcomes (FOTO)   35%      Sensation   Light Touch  Appears Intact      Posture/Postural Control   Posture/Postural Control  Postural limitations    Postural Limitations  Decreased lumbar lordosis    Posture Comments  L iliac crest higher       AROM   Overall AROM Comments  WFL in lumbar spine, except extension  limited 50%, pain with forward flexion in Rt buttock, LS spine       Strength   Overall Strength Comments  knee and ankles WFL     Right Hip Extension  4/5    Right Hip ABduction  4+/5    Left Hip Extension  4+/5    Left Hip ABduction  4+/5      Palpation   Spinal mobility  normal     SI assessment   normal     Palpation comment  soreness in Rt buttock, glute med, piriformis, min in lumbar spine       Special Tests    Special Tests  Lumbar    Lumbar Tests  Slump Test;Prone Knee Bend Test;Straight Leg Raise      Slump test   Findings  Negative      Prone Knee Bend Test   Findings  Negative      Straight Leg Raise   Findings  Negative                Objective measurements completed on examination: See above findings.              PT Education - 07/20/17 1819    Education Details  PT/POC, HEP and anatomy, eval findings    Person(s) Educated  Patient    Methods  Explanation;Handout;Demonstration    Comprehension  Verbalized understanding;Returned demonstration          PT Long Term Goals - 07/20/17 1820      PT LONG TERM GOAL #1   Title  Pt will be I with HEP for core, trunk flexibility     Time  6    Period  Weeks    Status  New    Target  Date  08/31/17      PT LONG TERM GOAL #2   Title  Pt will be able to return to normal pre-injury exercise level (aerobics, walking) to continue to manage weight    Time  6    Period  Weeks    Status  New    Target Date  08/31/17      PT LONG TERM GOAL #3   Title  Pt will be able to demo safe lifting , posture and body mechanics to  prevent reinjury.     Time  6    Period  Weeks    Status  New    Target Date  08/31/17      PT LONG TERM GOAL #4   Title  Pt will be able to go shopping, walk in community without limitation of back, hip pain .      Time  6    Period  Weeks    Status  New    Target Date  08/31/17      PT LONG TERM GOAL #5   Title  Pt will report resolution of numbness and groin pain.     Time  6    Period  Weeks    Status  New    Target Date  08/31/17             Plan - 07/20/17 1827    Clinical Impression Statement  Patient presents for low complexity eval of Rt sided low back pain which radiates into her Rt hip and pelvis.  Her symptoms have nearly resolved but she doe snot have confidence in her ability to return to prior level of function (physical activity). She will do well and will benefit from skilled PT to improve mobility, teach corrective exercises appropriate for her injury.      Clinical Presentation  Stable    Clinical Decision Making  Low    Rehab Potential  Excellent    PT Frequency  2x / week    PT Duration  6 weeks 4-6 weeks , allow for vacation    PT Treatment/Interventions  ADLs/Self Care Home Management;Electrical Stimulation;Functional mobility training;Neuromuscular re-education;Taping;Passive range of motion;Therapeutic activities;Moist Heat;Therapeutic exercise;Patient/family education;Manual techniques;Dry needling;Cryotherapy;Ultrasound    PT Next Visit Plan  check HEP, review posture/lifting for ADLs, add in core HEP , modalties if needed     PT Home Exercise Plan  LTR, knee to chest, piriformis and posterior pelvic tilt     Consulted and Agree with Plan of Care  Patient       Patient will benefit from skilled therapeutic intervention in order to improve the following deficits and impairments:  Impaired sensation, Decreased range of motion, Decreased strength, Increased fascial restricitons, Impaired flexibility, Impaired UE functional use, Postural dysfunction,  Pain, Decreased knowledge of precautions, Improper body mechanics, Decreased mobility, Difficulty walking  Visit Diagnosis: Acute right-sided low back pain with sciatica, sciatica laterality unspecified  Muscle weakness (generalized)  Other disturbances of skin sensation  Stiffness of right hip, not elsewhere classified     Problem List Patient Active Problem List   Diagnosis Date Noted  . Osteoarthritis of spine with radiculopathy, lumbar region 06/25/2017  . Lumbar pain 06/25/2017  . Spinal stenosis of lumbar region without neurogenic claudication 06/25/2017  . Sciatica of right side 06/19/2017  . GERD (gastroesophageal reflux disease) 09/19/2013  . Vertigo 03/02/2012  . Hypercholesterolemia 10/08/2010  . Benign hypertensive heart disease without heart failure 10/08/2010  . Obesity 10/08/2010  Lori Mooney 07/20/2017, 6:35 PM  [facility] [address] [place], [place], [address] Phone: [phone]   Fax:  [phone]  Name: Lori Mooney MRN: [id number] Date of Birth: May 03, 1945   Raeford Razor, PT 07/20/17 6:35 PM Phone: [phone] Fax: [phone]

## 2017-07-26 ENCOUNTER — Ambulatory Visit: Payer: Medicare Other | Attending: Orthopaedic Surgery | Admitting: Physical Therapy

## 2017-07-26 ENCOUNTER — Encounter: Payer: Self-pay | Admitting: Physical Therapy

## 2017-07-26 DIAGNOSIS — M6281 Muscle weakness (generalized): Secondary | ICD-10-CM | POA: Diagnosis not present

## 2017-07-26 DIAGNOSIS — M25651 Stiffness of right hip, not elsewhere classified: Secondary | ICD-10-CM | POA: Insufficient documentation

## 2017-07-26 DIAGNOSIS — R208 Other disturbances of skin sensation: Secondary | ICD-10-CM | POA: Insufficient documentation

## 2017-07-26 DIAGNOSIS — M5440 Lumbago with sciatica, unspecified side: Secondary | ICD-10-CM | POA: Diagnosis not present

## 2017-07-26 NOTE — Therapy (Signed)
[place], [place], [address] Phone: [phone]   Fax:  [phone]  Physical Therapy Treatment  Patient Details  Name: Lori Mooney MRN: [id number] Date of Birth: [birth year] Referring Provider: Dr. Joni Fears    Encounter Date: 07/26/2017  PT End of Session - 07/26/17 1042    Visit Number  2    Number of Visits  8    Date for PT Re-Evaluation  08/31/17    PT Start Time  1007    PT Stop Time  1100    PT Time Calculation (min)  53 min    Activity Tolerance  Patient tolerated treatment well    Behavior During Therapy  [facility] for tasks assessed/performed       Past Medical History:  Diagnosis Date  . Allergy   . 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 Cataract   . Exogenous obesity   . GERD (gastroesophageal reflux disease)   . Hyperlipidemia    hypercholesterolemia  . Hypertension     Past Surgical History:  Procedure Laterality Date  . CATARACT EXTRACTION W/ INTRAOCULAR LENS IMPLANT Left   . DILITATION & CURRETTAGE/HYSTROSCOPY WITH VERSAPOINT RESECTION N/A 08/25/2012   Procedure: DILATATION & CURETTAGE/HYSTEROSCOPY WITH VERSAPOINT RESECTION;  Surgeon: Princess Bruins, MD;  Location: [facility];  Service: Gynecology;  Laterality: N/A;  . EYE SURGERY    . TONSILLECTOMY    . TUBAL LIGATION      There were no vitals filed for this visit.  Subjective Assessment - 07/26/17 1014    Subjective  Patient with questions reagarding her HEP, anatomy, should she still get the injection? Goes to [place], [place] T[place].     Currently in Pain?  No/denies          [facility] Adult PT Treatment/Exercise - 07/26/17 0001      Lumbar Exercises: Stretches   Active Hamstring Stretch  2 reps;30 seconds    Single Knee to Chest Stretch  2 reps    Lower Trunk Rotation  10 seconds    Lower Trunk Rotation Limitations  x 10     Pelvic Tilt  10 reps    Piriformis Stretch  3 reps;30 seconds    Figure 4  Stretch  2 reps;30 seconds      Lumbar Exercises: Aerobic   Nustep  5 min L 5 UE and LE       Lumbar Exercises: Supine   Ab Set  10 reps    Pelvic Tilt Limitations  to find neutral     Clam  10 reps;Other (comment) 3 sets for demo and teaching     Heel Slides  10 reps    Bent Knee Raise  10 reps    Bridge  10 reps             PT Education - 07/26/17 1037    Education Details  neutral, core, HEP, anatomy , tips for preventing pain while on her vacation     Person(s) Educated  Patient    Methods  Explanation;Demonstration    Comprehension  Verbalized understanding;Tactile cues required;Verbal cues required          PT Long Term Goals - 07/20/17 1820      PT LONG TERM GOAL #1   Title  Pt will be I with HEP for core, trunk flexibility     Time  6    Period  Weeks    Status  New  Target Date  08/31/17      PT LONG TERM GOAL #2   Title  Pt will be able to return to normal pre-injury exercise level (aerobics, walking) to continue to manage weight    Time  6    Period  Weeks    Status  New    Target Date  08/31/17      PT LONG TERM GOAL #3   Title  Pt will be able to demo safe lifting , posture and body mechanics to prevent reinjury.     Time  6    Period  Weeks    Status  New    Target Date  08/31/17      PT LONG TERM GOAL #4   Title  Pt will be able to go shopping, walk in community without limitation of back, hip pain .      Time  6    Period  Weeks    Status  New    Target Date  08/31/17      PT LONG TERM GOAL #5   Title  Pt will report resolution of numbness and groin pain.     Time  6    Period  Weeks    Status  New    Target Date  08/31/17            Plan - 07/26/17 1100    Clinical Impression Statement  Patient without pain today.  Educated on pain vs stretch, exercise technique and principles of stabilization.  She had no increase in pain today.  Driving long distance this week, hopes to be able to keep pain at bay and may canel her ESI  if she continues to have no pain flare up during her trip.     PT Treatment/Interventions  ADLs/Self Care Home Management;Electrical Stimulation;Functional mobility training;Neuromuscular re-education;Taping;Passive range of motion;Therapeutic activities;Moist Heat;Therapeutic exercise;Patient/family education;Manual techniques;Dry needling;Cryotherapy;Ultrasound    PT Next Visit Plan  check HEP (stabilization) how was her trip?  review posture/lifting for ADLs, add in core HEP , modalties if needed     PT Home Exercise Plan  hamstring  piriformis and posterior pelvic tilt , L stab I     Consulted and Agree with Plan of Care  Patient       Patient will benefit from skilled therapeutic intervention in order to improve the following deficits and impairments:  Impaired sensation, Decreased range of motion, Decreased strength, Increased fascial restricitons, Impaired flexibility, Impaired UE functional use, Postural dysfunction, Pain, Decreased knowledge of precautions, Improper body mechanics, Decreased mobility, Difficulty walking  Visit Diagnosis: No diagnosis found.     Problem List Patient Active Problem List   Diagnosis Date Noted  . Osteoarthritis of spine with radiculopathy, lumbar region 06/25/2017  . Lumbar pain 06/25/2017  . Spinal stenosis of lumbar region without neurogenic claudication 06/25/2017  . Sciatica of right side 06/19/2017  . GERD (gastroesophageal reflux disease) 09/19/2013  . Vertigo 03/02/2012  . Hypercholesterolemia 10/08/2010  . Benign hypertensive heart disease without heart failure 10/08/2010  . Obesity 10/08/2010    Jalaya Sarver 07/26/2017, 11:21 AM  [facility] [address] [place], [place], [address] Phone: [phone]   Fax:  [phone]  Name: Taegan Haider MRN: [id number] Date of Birth: Sep 28, 1945  Raeford Razor, PT 07/26/17 11:21 AM Phone: [phone] Fax: [phone]

## 2017-08-03 ENCOUNTER — Ambulatory Visit: Payer: Medicare Other | Admitting: Physical Therapy

## 2017-08-03 ENCOUNTER — Encounter: Payer: Self-pay | Admitting: Physical Therapy

## 2017-08-03 DIAGNOSIS — M6281 Muscle weakness (generalized): Secondary | ICD-10-CM | POA: Diagnosis not present

## 2017-08-03 DIAGNOSIS — M5440 Lumbago with sciatica, unspecified side: Secondary | ICD-10-CM | POA: Diagnosis not present

## 2017-08-03 DIAGNOSIS — R208 Other disturbances of skin sensation: Secondary | ICD-10-CM | POA: Diagnosis not present

## 2017-08-03 DIAGNOSIS — M25651 Stiffness of right hip, not elsewhere classified: Secondary | ICD-10-CM | POA: Diagnosis not present

## 2017-08-03 NOTE — Therapy (Signed)
[place], [place], [address] Phone: [phone]   Fax:  [phone]  Physical Therapy Treatment  Patient Details  Name: Lori Mooney MRN: [id number] Date of Birth: 1945/03/08 Referring Provider: Dr. Joni Fears    Encounter Date: 08/03/2017  PT End of Session - 08/03/17 1322    Visit Number  3    Number of Visits  8    Date for PT Re-Evaluation  08/31/17    PT Start Time  1105    PT Stop Time  1148    PT Time Calculation (min)  43 min    Activity Tolerance  Patient tolerated treatment well    Behavior During Therapy  [facility] for tasks assessed/performed       Past Medical History:  Diagnosis Date  . Allergy   . 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 Cataract   . Exogenous obesity   . GERD (gastroesophageal reflux disease)   . Hyperlipidemia    hypercholesterolemia  . Hypertension     Past Surgical History:  Procedure Laterality Date  . CATARACT EXTRACTION W/ INTRAOCULAR LENS IMPLANT Left   . DILITATION & CURRETTAGE/HYSTROSCOPY WITH VERSAPOINT RESECTION N/A 08/25/2012   Procedure: DILATATION & CURETTAGE/HYSTEROSCOPY WITH VERSAPOINT RESECTION;  Surgeon: Princess Bruins, MD;  Location: [facility];  Service: Gynecology;  Laterality: N/A;  . EYE SURGERY    . TONSILLECTOMY    . TUBAL LIGATION      There were no vitals filed for this visit.  Subjective Assessment - 08/03/17 1108    Subjective  Trip went  well,  had new shoes and pain in knee increased pain.  Now has good shoes and they feel     Currently in Pain?  Yes    Pain Score  2     Pain Location  Hip    Pain Orientation  Right    Pain Descriptors / Indicators  Aching deep    Pain Frequency  Intermittent    Aggravating Factors   walking,  wrong shoes,  sitting in back of car longer    Pain Relieving Factors  change of position,  medication,  heating pad    Effect of Pain on Daily Activities  limits walking for  exercise                        [facility] Adult PT Treatment/Exercise - 08/03/17 0001      Self-Care   Other Self-Care Comments   answered multiple questions ,  refered her to nutritionist for weightloss ideas.  shoe,  gait suggestions   anatomy,  sciatic nerve  things that may irritate.  Avoid walking without shoes/ support pillows will make prone position more comfortable      Lumbar Exercises: Stretches   Passive Hamstring Stretch  3 reps;20 seconds    Lower Trunk Rotation  10 seconds    Lower Trunk Rotation Limitations  x 10     Hip Flexor Stretch  1 rep;10 seconds prone PROM    Piriformis Stretch  3 reps;30 seconds    Other Lumbar Stretch Exercise  prone hip IR/ER stretches (Feels good)      Lumbar Exercises: Seated   Other Seated Lumbar Exercises  Pelvic tilt to find neutral,  anterior tilt pain ful,  neutral sitting with low hip flexion 5 x stopped due to pain    Other Seated Lumbar Exercises  isometric abdominal ex,  both and single hand press into thigh.  pillow used to decrease hand numbness.  also upper trap stretch  2 reps each side  decreased numbness      Lumbar Exercises: Supine   Bridge  10 reps no pain    Other Supine Lumbar Exercises  decompression,  shoulder press 5 x 5 seconds,  head press 5 x 5 seconds,  leg lengthener and leg presses each 5 x  5 seconds.               PT Education - 08/03/17 1322    Education Details  self care, see flow sheet    Person(s) Educated  Patient    Methods  Explanation;Demonstration;Verbal cues    Comprehension  Verbalized understanding          PT Long Term Goals - 07/20/17 1820      PT LONG TERM GOAL #1   Title  Pt will be I with HEP for core, trunk flexibility     Time  6    Period  Weeks    Status  New    Target Date  08/31/17      PT LONG TERM GOAL #2   Title  Pt will be able to return to normal pre-injury exercise level (aerobics, walking) to continue to manage weight    Time  6    Period   Weeks    Status  New    Target Date  08/31/17      PT LONG TERM GOAL #3   Title  Pt will be able to demo safe lifting , posture and body mechanics to prevent reinjury.     Time  6    Period  Weeks    Status  New    Target Date  08/31/17      PT LONG TERM GOAL #4   Title  Pt will be able to go shopping, walk in community without limitation of back, hip pain .      Time  6    Period  Weeks    Status  New    Target Date  08/31/17      PT LONG TERM GOAL #5   Title  Pt will report resolution of numbness and groin pain.     Time  6    Period  Weeks    Status  New    Target Date  08/31/17            Plan - 08/03/17 1323    Clinical Impression Statement  Patient has decided to get injection.  she will cancel next visit if she decides not to attend. (I encouraged her to attend if MD allows)  Extra time spent with education today.  Shea new shoes which now feel good and knee pain may resolve due to good support.  She has been inconsistant with HEP due to being on vacation.     PT Next Visit Plan  See how injection went. check HEP consider adding prone hip IR/ER stretches.  review posture/lifting for ADLs, add in core HEP , modalties if needed     PT Home Exercise Plan  hamstring  piriformis and posterior pelvic tilt , L stab I     Consulted and Agree with Plan of Care  Patient       Patient will benefit from skilled therapeutic intervention in order to improve the following deficits and impairments:     Visit Diagnosis: Acute right-sided low back pain  with sciatica, sciatica laterality unspecified  Muscle weakness (generalized)  Other disturbances of skin sensation  Stiffness of right hip, not elsewhere classified     Problem List Patient Active Problem List   Diagnosis Date Noted  . Osteoarthritis of spine with radiculopathy, lumbar region 06/25/2017  . Lumbar pain 06/25/2017  . Spinal stenosis of lumbar region without neurogenic claudication 06/25/2017  . Sciatica  of right side 06/19/2017  . GERD (gastroesophageal reflux disease) 09/19/2013  . Vertigo 03/02/2012  . Hypercholesterolemia 10/08/2010  . Benign hypertensive heart disease without heart failure 10/08/2010  . Obesity 10/08/2010    HARRIS,KAREN PTA 08/03/2017, 1:28 PM  [facility] [address] [place], [place], [address] Phone: [phone]   Fax:  [phone]  Name: Lori Mooney MRN: [id number] Date of Birth: 1945-02-26

## 2017-08-04 ENCOUNTER — Ambulatory Visit (INDEPENDENT_AMBULATORY_CARE_PROVIDER_SITE_OTHER): Payer: Medicare Other | Admitting: Physical Medicine and Rehabilitation

## 2017-08-04 ENCOUNTER — Encounter (INDEPENDENT_AMBULATORY_CARE_PROVIDER_SITE_OTHER): Payer: Self-pay | Admitting: Physical Medicine and Rehabilitation

## 2017-08-04 ENCOUNTER — Ambulatory Visit (INDEPENDENT_AMBULATORY_CARE_PROVIDER_SITE_OTHER): Payer: Self-pay

## 2017-08-04 VITALS — BP 138/98 | HR 87

## 2017-08-04 DIAGNOSIS — M5416 Radiculopathy, lumbar region: Secondary | ICD-10-CM

## 2017-08-04 DIAGNOSIS — M48062 Spinal stenosis, lumbar region with neurogenic claudication: Secondary | ICD-10-CM

## 2017-08-04 HISTORY — PX: LUMBAR EPIDURAL INJECTION: SHX1980

## 2017-08-04 MED ORDER — BETAMETHASONE SOD PHOS & ACET 6 (3-3) MG/ML IJ SUSP
12.0000 mg | Freq: Once | INTRAMUSCULAR | Status: AC
  Administered 2017-08-04: 12 mg

## 2017-08-04 NOTE — Patient Instructions (Signed)

## 2017-08-04 NOTE — Progress Notes (Signed)
 .  Numeric Pain Rating Scale and Functional Assessment Average Pain 6   In the last MONTH (on 0-10 scale) has pain interfered with the following?  1. General activity like being  able to carry out your everyday physical activities such as walking, climbing stairs, carrying groceries, or moving a chair?  Rating(5)   +Driver, -BT, +Dye Allergies([employer]).

## 2017-08-06 ENCOUNTER — Ambulatory Visit: Payer: Medicare Other | Admitting: Physical Therapy

## 2017-08-06 ENCOUNTER — Encounter: Payer: Self-pay | Admitting: Physical Therapy

## 2017-08-06 DIAGNOSIS — R208 Other disturbances of skin sensation: Secondary | ICD-10-CM

## 2017-08-06 DIAGNOSIS — M6281 Muscle weakness (generalized): Secondary | ICD-10-CM

## 2017-08-06 DIAGNOSIS — M5440 Lumbago with sciatica, unspecified side: Secondary | ICD-10-CM

## 2017-08-06 DIAGNOSIS — M25651 Stiffness of right hip, not elsewhere classified: Secondary | ICD-10-CM

## 2017-08-06 NOTE — Therapy (Signed)
[place], [place], [address] Phone: [phone]   Fax:  [phone]  Physical Therapy Treatment  Patient Details  Name: Lori Mooney MRN: [id number] Date of Birth: [birth year] Referring Provider: Dr. Joni Fears    Encounter Date: 08/06/2017  PT End of Session - 08/06/17 1110    Visit Number  4    Number of Visits  8    Date for PT Re-Evaluation  08/31/17    PT Start Time  1015    PT Stop Time  1100    PT Time Calculation (min)  45 min    Activity Tolerance  Patient tolerated treatment well    Behavior During Therapy  [facility] for tasks assessed/performed       Past Medical History:  Diagnosis Date  . Allergy   . 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 Cataract   . Exogenous obesity   . GERD (gastroesophageal reflux disease)   . Hyperlipidemia    hypercholesterolemia  . Hypertension     Past Surgical History:  Procedure Laterality Date  . CATARACT EXTRACTION W/ INTRAOCULAR LENS IMPLANT Left   . DILITATION & CURRETTAGE/HYSTROSCOPY WITH VERSAPOINT RESECTION N/A 08/25/2012   Procedure: DILATATION & CURETTAGE/HYSTEROSCOPY WITH VERSAPOINT RESECTION;  Surgeon: Princess Bruins, MD;  Location: [facility];  Service: Gynecology;  Laterality: N/A;  . EYE SURGERY    . TONSILLECTOMY    . TUBAL LIGATION      There were no vitals filed for this visit.  Subjective Assessment - 08/06/17 1018    Subjective  Injection went well.  Still doing her exercises and loving her new shoes.      Currently in Pain?  No/denies          [facility] Adult PT Treatment/Exercise - 08/06/17 0001      Lumbar Exercises: Stretches   Active Hamstring Stretch  2 reps;30 seconds    Lower Trunk Rotation  10 seconds    Lower Trunk Rotation Limitations  x 10     Pelvic Tilt  10 reps    Piriformis Stretch  3 reps;30 seconds      Lumbar Exercises: Aerobic   Nustep  5 min L 5 UE and LE       Lumbar Exercises:  Supine   Clam  20 reps used ball under feet to destabilize     Bent Knee Raise  10 reps    Bridge  10 reps articulating     Straight Leg Raise  10 reps core focus              PT Education - 08/06/17 1110    Education Details  HEP, core, PoC    Person(s) Educated  Patient    Methods  Explanation;Demonstration;Handout    Comprehension  Verbalized understanding;Returned demonstration          PT Long Term Goals - 08/06/17 1111      PT LONG TERM GOAL #1   Title  Pt will be I with HEP for core, trunk flexibility     Baseline  up to date     Status  On-going      PT LONG TERM GOAL #2   Title  Pt will be able to return to normal pre-injury exercise level (aerobics, walking) to continue to manage weight    Status  On-going      PT LONG TERM GOAL #3   Title  Pt will be able  to demo safe lifting , posture and body mechanics to prevent reinjury.     Status  On-going      PT LONG TERM GOAL #4   Title  Pt will be able to go shopping, walk in community without limitation of back, hip pain .      Status  On-going      PT LONG TERM GOAL #5   Title  Pt will report resolution of numbness and groin pain.     Status  Achieved            Plan - 08/06/17 1111    Clinical Impression Statement  She has done well after injection, was a bit more sore after her last visit.  She now has a full HEP. SHe will probably only need 2-3 more visits to work more on posture , lifting, and attempt to walk for fitness to ensure she understands the principles.     PT Treatment/Interventions  ADLs/Self Care Home Management;Electrical Stimulation;Functional mobility training;Neuromuscular re-education;Taping;Passive range of motion;Therapeutic activities;Moist Heat;Therapeutic exercise;Patient/family education;Manual techniques;Dry needling;Cryotherapy;Ultrasound    PT Next Visit Plan   review posture/lifting for ADLs,  modalties if needed     PT Home Exercise Plan  hamstring  piriformis and  posterior pelvic tilt , L stab I , bridge and SLR core focus        Patient will benefit from skilled therapeutic intervention in order to improve the following deficits and impairments:  Impaired sensation, Decreased range of motion, Decreased strength, Increased fascial restricitons, Impaired flexibility, Impaired UE functional use, Postural dysfunction, Pain, Decreased knowledge of precautions, Improper body mechanics, Decreased mobility, Difficulty walking  Visit Diagnosis: Acute right-sided low back pain with sciatica, sciatica laterality unspecified  Muscle weakness (generalized)  Other disturbances of skin sensation  Stiffness of right hip, not elsewhere classified     Problem List Patient Active Problem List   Diagnosis Date Noted  . Osteoarthritis of spine with radiculopathy, lumbar region 06/25/2017  . Lumbar pain 06/25/2017  . Spinal stenosis of lumbar region without neurogenic claudication 06/25/2017  . Sciatica of right side 06/19/2017  . GERD (gastroesophageal reflux disease) 09/19/2013  . Vertigo 03/02/2012  . Hypercholesterolemia 10/08/2010  . Benign hypertensive heart disease without heart failure 10/08/2010  . Obesity 10/08/2010    Lori Mooney 08/06/2017, 11:14 AM  [place], [place], [address] Phone: [phone]   Fax:  [phone]  Name: Lori Mooney MRN: [id number] Date of Birth: October 23, 1945  Raeford Razor, PT 08/06/17 11:14 AM Phone: [phone] Fax: [phone]

## 2017-08-10 ENCOUNTER — Ambulatory Visit: Payer: Medicare Other | Admitting: Physical Therapy

## 2017-08-10 ENCOUNTER — Encounter: Payer: Self-pay | Admitting: Physical Therapy

## 2017-08-10 DIAGNOSIS — R208 Other disturbances of skin sensation: Secondary | ICD-10-CM | POA: Diagnosis not present

## 2017-08-10 DIAGNOSIS — M5440 Lumbago with sciatica, unspecified side: Secondary | ICD-10-CM | POA: Diagnosis not present

## 2017-08-10 DIAGNOSIS — M25651 Stiffness of right hip, not elsewhere classified: Secondary | ICD-10-CM | POA: Diagnosis not present

## 2017-08-10 DIAGNOSIS — M6281 Muscle weakness (generalized): Secondary | ICD-10-CM | POA: Diagnosis not present

## 2017-08-10 NOTE — Therapy (Signed)
[place], [place], [address] Phone: [phone]   Fax:  [phone]  Physical Therapy Treatment  Patient Details  Name: Lori Mooney MRN: [id number] Date of Birth: 09-20-1945 Referring Provider: Dr. Joni Fears    Encounter Date: 08/10/2017  PT End of Session - 08/10/17 1232    Visit Number  5    Number of Visits  8    Date for PT Re-Evaluation  08/31/17    PT Start Time  1146    PT Stop Time  1228    PT Time Calculation (min)  42 min    Activity Tolerance  Patient tolerated treatment well    Behavior During Therapy  [facility] for tasks assessed/performed       Past Medical History:  Diagnosis Date  . Allergy   . 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 Cataract   . Exogenous obesity   . GERD (gastroesophageal reflux disease)   . Hyperlipidemia    hypercholesterolemia  . Hypertension     Past Surgical History:  Procedure Laterality Date  . CATARACT EXTRACTION W/ INTRAOCULAR LENS IMPLANT Left   . DILITATION & CURRETTAGE/HYSTROSCOPY WITH VERSAPOINT RESECTION N/A 08/25/2012   Procedure: DILATATION & CURETTAGE/HYSTEROSCOPY WITH VERSAPOINT RESECTION;  Surgeon: Princess Bruins, MD;  Location: [facility];  Service: Gynecology;  Laterality: N/A;  . EYE SURGERY    . TONSILLECTOMY    . TUBAL LIGATION      There were no vitals filed for this visit.  Subjective Assessment - 08/10/17 1152    Subjective  Has not taken Meloxicam.  No pain.  Havent done my exercises today.     Currently in Pain?  No/denies          [facility] Adult PT Treatment/Exercise - 08/10/17 0001      Self-Care   Self-Care  Lifting;Other Self-Care Comments    Lifting  golfers lift for light items, walking program , core, hip hinging       Lumbar Exercises: Stretches   Active Hamstring Stretch  2 reps;30 seconds    Single Knee to Chest Stretch  2 reps    Lower Trunk Rotation  10 seconds    Lower Trunk Rotation  Limitations  x 10     Pelvic Tilt  10 reps    Piriformis Stretch  2 reps;30 seconds      Lumbar Exercises: Aerobic   Tread Mill  2.0 mph for 8 min       Lumbar Exercises: Supine   Clam  20 reps    Bent Knee Raise  10 reps    Bridge  10 reps articulating     Bridge with clamshell  10 reps    Straight Leg Raise  -- core focus              PT Education - 08/10/17 1233    Education Details  see flowsheet     Person(s) Educated  Patient    Methods  Explanation    Comprehension  Verbalized understanding          PT Long Term Goals - 08/06/17 1111      PT LONG TERM GOAL #1   Title  Pt will be I with HEP for core, trunk flexibility     Baseline  up to date     Status  On-going      PT LONG TERM GOAL #2   Title  Pt will be  able to return to normal pre-injury exercise level (aerobics, walking) to continue to manage weight    Status  On-going      PT LONG TERM GOAL #3   Title  Pt will be able to demo safe lifting , posture and body mechanics to prevent reinjury.     Status  On-going      PT LONG TERM GOAL #4   Title  Pt will be able to go shopping, walk in community without limitation of back, hip pain .      Status  On-going      PT LONG TERM GOAL #5   Title  Pt will report resolution of numbness and groin pain.     Status  Achieved            Plan - 08/10/17 1202    Clinical Impression Statement  Patient doing well, but has not tried to do any walking recently.  She hopes to be able to do her [facility] class at [facility] [employer]      PT Treatment/Interventions  ADLs/Self Care Home Management;Electrical Stimulation;Functional mobility training;Neuromuscular re-education;Taping;Passive range of motion;Therapeutic activities;Moist Heat;Therapeutic exercise;Patient/family education;Manual techniques;Dry needling;Cryotherapy;Ultrasound    PT Next Visit Plan   review posture/lifting for ADLs,  modalties if needed     PT Home Exercise Plan  hamstring  piriformis  and posterior pelvic tilt , L stab I , bridge and SLR core focus        Patient will benefit from skilled therapeutic intervention in order to improve the following deficits and impairments:  Impaired sensation, Decreased range of motion, Decreased strength, Increased fascial restricitons, Impaired flexibility, Impaired UE functional use, Postural dysfunction, Pain, Decreased knowledge of precautions, Improper body mechanics, Decreased mobility, Difficulty walking  Visit Diagnosis: Acute right-sided low back pain with sciatica, sciatica laterality unspecified  Muscle weakness (generalized)  Other disturbances of skin sensation  Stiffness of right hip, not elsewhere classified     Problem List Patient Active Problem List   Diagnosis Date Noted  . Osteoarthritis of spine with radiculopathy, lumbar region 06/25/2017  . Lumbar pain 06/25/2017  . Spinal stenosis of lumbar region without neurogenic claudication 06/25/2017  . Sciatica of right side 06/19/2017  . GERD (gastroesophageal reflux disease) 09/19/2013  . Vertigo 03/02/2012  . Hypercholesterolemia 10/08/2010  . Benign hypertensive heart disease without heart failure 10/08/2010  . Obesity 10/08/2010    Synai Prettyman 08/10/2017, 12:34 PM  [facility] [address] [place], [place], [address] Phone: [phone]   Fax:  [phone]  Name: Marieanne Marxen MRN: [id number] Date of Birth: Aug 25, 1945   Raeford Razor, PT 08/10/17 12:34 PM Phone: [phone] Fax: [phone]

## 2017-08-12 ENCOUNTER — Ambulatory Visit: Payer: Medicare Other | Admitting: Physical Therapy

## 2017-08-12 ENCOUNTER — Encounter: Payer: Self-pay | Admitting: Physical Therapy

## 2017-08-12 DIAGNOSIS — M6281 Muscle weakness (generalized): Secondary | ICD-10-CM | POA: Diagnosis not present

## 2017-08-12 DIAGNOSIS — M5440 Lumbago with sciatica, unspecified side: Secondary | ICD-10-CM | POA: Diagnosis not present

## 2017-08-12 DIAGNOSIS — M25651 Stiffness of right hip, not elsewhere classified: Secondary | ICD-10-CM | POA: Diagnosis not present

## 2017-08-12 DIAGNOSIS — R208 Other disturbances of skin sensation: Secondary | ICD-10-CM | POA: Diagnosis not present

## 2017-08-12 NOTE — Therapy (Signed)
[place], [place], [address] Phone: [phone]   Fax:  [phone]  Physical Therapy Treatment  Patient Details  Name: Lori Mooney MRN: [id number] Date of Birth: 1945/08/15 Referring Provider: Dr. Joni Fears    Encounter Date: 08/12/2017  PT End of Session - 08/12/17 1519    Visit Number  6    Number of Visits  8    Date for PT Re-Evaluation  08/31/17    PT Start Time  1332    PT Stop Time  1416    PT Time Calculation (min)  44 min    Activity Tolerance  Patient tolerated treatment well    Behavior During Therapy  [facility] for tasks assessed/performed       Past Medical History:  Diagnosis Date  . Allergy   . 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 Cataract   . Exogenous obesity   . GERD (gastroesophageal reflux disease)   . Hyperlipidemia    hypercholesterolemia  . Hypertension     Past Surgical History:  Procedure Laterality Date  . CATARACT EXTRACTION W/ INTRAOCULAR LENS IMPLANT Left   . DILITATION & CURRETTAGE/HYSTROSCOPY WITH VERSAPOINT RESECTION N/A 08/25/2012   Procedure: DILATATION & CURETTAGE/HYSTEROSCOPY WITH VERSAPOINT RESECTION;  Surgeon: Princess Bruins, MD;  Location: [facility];  Service: Gynecology;  Laterality: N/A;  . EYE SURGERY    . TONSILLECTOMY    . TUBAL LIGATION      There were no vitals filed for this visit.  Subjective Assessment - 08/12/17 1336    Subjective  wORKING 4 HOURS YESTERDAY  BACK A LITTLE SORE.                        [facility] Adult PT Treatment/Exercise - 08/12/17 0001      Self-Care   Self-Care  Lifting;ADL's    Other Self-Care Comments   lifting practice and carry,  simulated vacume etc. handout revied,,verbal ed about long handle dustpan etc.       Lumbar Exercises: Stretches   Passive Hamstring Stretch  3 reps;30 seconds leg pain right     Single Knee to Chest Stretch  3 reps;30 seconds both    Lower Trunk  Rotation  5 reps 10 seconds    Pelvic Tilt  10 reps    Piriformis Stretch  3 reps;20 seconds both      Lumbar Exercises: Supine   Other Supine Lumbar Exercises  DECOMPRESSION SERIES ALL PR issued for HEP             PT Education - 08/12/17 1357    Education Details  HEP,  ADL    Person(s) Educated  Patient    Methods  Demonstration;Tactile cues;Verbal cues;Handout;Explanation    Comprehension  Verbalized understanding;Returned demonstration          PT Long Term Goals - 08/12/17 1522      PT LONG TERM GOAL #1   Title  Pt will be I with HEP for core, trunk flexibility     Baseline  up to date ,  new added  today    Time  6    Period  Weeks    Status  On-going      PT LONG TERM GOAL #2   Title  Pt will be able to return to normal pre-injury exercise level (aerobics, walking) to continue to manage weight    Baseline  not yet  Time  6    Period  Weeks    Status  On-going      PT LONG TERM GOAL #3   Title  Pt will be able to demo safe lifting , posture and body mechanics to prevent reinjury.     Baseline  minor cues able to do at end of session    Time  6    Period  Weeks    Status  Partially Met      PT LONG TERM GOAL #4   Title  Pt will be able to go shopping, walk in community without limitation of back, hip pain .      Time  6    Period  Weeks    Status  Unable to assess      PT LONG TERM GOAL #5   Title  Pt will report resolution of numbness and groin pain.     Time  6    Period  Weeks    Status  Achieved            Plan - 08/12/17 1519    Clinical Impression Statement  Patient able to decrease back pain with stretching.  She wanted decompression exercises so these were issued.  She needed cues for ADL and overall did well.  No pain at end of session.   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 progressing toward goals see flow sheet.    PT Next Visit Plan  answer any ADL questions  work toward goals.     PT  Home Exercise Plan  hamstring  piriformis and posterior pelvic tilt , L stab I , bridge and SLR core focus   Decompression    Consulted and Agree with Plan of Care  Patient       Patient will benefit from skilled therapeutic intervention in order to improve the following deficits and impairments:     Visit Diagnosis: Acute right-sided low back pain with sciatica, sciatica laterality unspecified  Muscle weakness (generalized)  Other disturbances of skin sensation  Stiffness of right hip, not elsewhere classified     Problem List Patient Active Problem List   Diagnosis Date Noted  . Osteoarthritis of spine with radiculopathy, lumbar region 06/25/2017  . Lumbar pain 06/25/2017  . Spinal stenosis of lumbar region without neurogenic claudication 06/25/2017  . Sciatica of right side 06/19/2017  . GERD (gastroesophageal reflux disease) 09/19/2013  . Vertigo 03/02/2012  . Hypercholesterolemia 10/08/2010  . Benign hypertensive heart disease without heart failure 10/08/2010  . Obesity 10/08/2010    HARRIS,KAREN PTA 08/12/2017, 3:25 PM  [facility] [address] [place], [place], [address] Phone: [phone]   Fax:  [phone]  Name: Lori Mooney MRN: [id number] Date of Birth: Oct 02, 1945

## 2017-08-12 NOTE — Patient Instructions (Addendum)
Calf Stretch    Place one leg forward, bent, other leg behind and straight. Lean forward keeping back heel flat. Hold __30__ seconds while counting out loud. Repeat with other leg forward. Repeat __3_ times. Do 1____ sessions per day.  http://gt2.exer.us/478   Copyright  VHI. All rights reserved.  Sleeping on Back  Place pillow under knees. A pillow with cervical support and a roll around waist are also helpful. Copyright  VHI. All rights reserved.  Sleeping on Side Place pillow between knees. Use cervical support under neck and a roll around waist as needed. Copyright  VHI. All rights reserved.   Sleeping on Stomach   If this is the only desirable sleeping position, place pillow under lower legs, and under stomach or chest as needed.  Posture - Sitting   Sit upright, head facing forward. Try using a roll to support lower back. Keep shoulders relaxed, and avoid rounded back. Keep hips level with knees. Avoid crossing legs for long periods. Stand to Sit / Sit to Stand   To sit: Bend knees to lower self onto front edge of chair, then scoot back on seat. To stand: Reverse sequence by placing one foot forward, and scoot to front of seat. Use rocking motion to stand up.   Work Height and Reach  Ideal work height is no more than 2 to 4 inches below elbow level when standing, and at elbow level when sitting. Reaching should be limited to arm's length, with elbows slightly bent.  Bending  Bend at hips and knees, not back. Keep feet shoulder-width apart.    Posture - Standing   Good posture is important. Avoid slouching and forward head thrust. Maintain curve in low back and align ears over shoul- ders, hips over ankles.  Alternating Positions   Alternate tasks and change positions frequently to reduce fatigue and muscle tension. Take rest breaks. Computer Work   Position work to Programmer, multimedia. Use proper work and seat height. Keep shoulders back and down, wrists  straight, and elbows at right angles. Use chair that provides full back support. Add footrest and lumbar roll as needed.  Getting Into / Out of Car  Lower self onto seat, scoot back, then bring in one leg at a time. Reverse sequence to get out.  Dressing  Lie on back to pull socks or slacks over feet, or sit and bend leg while keeping back straight.    Housework - Sink  Place one foot on ledge of cabinet under sink when standing at sink for prolonged periods.   Pushing / Pulling  Pushing is preferable to pulling. Keep back in proper alignment, and use leg muscles to do the work.  Deep Squat   Squat and lift with both arms held against upper trunk. Tighten stomach muscles without holding breath. Use smooth movements to avoid jerking.  Avoid Twisting   Avoid twisting or bending back. Pivot around using foot movements, and bend at knees if needed when reaching for articles.  Carrying Luggage   Distribute weight evenly on both sides. Use a cart whenever possible. Do not twist trunk. Move body as a unit.   Lifting Principles .Maintain proper posture and head alignment. .Slide object as close as possible before lifting. .Move obstacles out of the way. .Test before lifting; ask for help if too heavy. .Tighten stomach muscles without holding breath. .Use smooth movements; do not jerk. .Use legs to do the work, and pivot with feet. .Distribute the work load symmetrically and close to  the center of trunk. .Push instead of pull whenever possible.   Ask For Help   Ask for help and delegate to others when possible. Coordinate your movements when lifting together, and maintain the low back curve.  Log Roll   Lying on back, bend left knee and place left arm across chest. Roll all in one movement to the right. Reverse to roll to the left. Always move as one unit. Housework - Sweeping  Use long-handled equipment to avoid stooping.   Housework - Wiping  Position yourself  as close as possible to reach work surface. Avoid straining your back.  Laundry - Unloading Wash   To unload small items at bottom of washer, lift leg opposite to arm being used to reach.  [place] close to area to be raked. Use arm movements to do the work. Keep back straight and avoid twisting.     Cart  When reaching into cart with one arm, lift opposite leg to keep back straight.   Getting Into / Out of Bed  Lower self to lie down on one side by raising legs and lowering head at the same time. Use arms to assist moving without twisting. Bend both knees to roll onto back if desired. To sit up, start from lying on side, and use same move-ments in reverse. Housework - Vacuuming  Hold the vacuum with arm held at side. Step back and forth to move it, keeping head up. Avoid twisting.   Laundry - IT consultant so that bending and twisting can be avoided.   Laundry - Unloading Dryer  Squat down to reach into clothes dryer or use a reacher.  Gardening - Weeding / Probation officer or Kneel. Knee pads may be helpful.                   Sleeping on Back  Place pillow under knees. A pillow with cervical support and a roll around waist are also helpful. Copyright  VHI. All rights reserved.  Sleeping on Side Place pillow between knees. Use cervical support under neck and a roll around waist as needed. Copyright  VHI. All rights reserved.   Sleeping on Stomach   If this is the only desirable sleeping position, place pillow under lower legs, and under stomach or chest as needed.  Posture - Sitting   Sit upright, head facing forward. Try using a roll to support lower back. Keep shoulders relaxed, and avoid rounded back. Keep hips level with knees. Avoid crossing legs for long periods. Stand to Sit / Sit to Stand   To sit: Bend knees to lower self onto front edge of chair, then scoot back on seat. To stand: Reverse  sequence by placing one foot forward, and scoot to front of seat. Use rocking motion to stand up.   Work Height and Reach  Ideal work height is no more than 2 to 4 inches below elbow level when standing, and at elbow level when sitting. Reaching should be limited to arm's length, with elbows slightly bent.  Bending  Bend at hips and knees, not back. Keep feet shoulder-width apart.    Posture - Standing   Good posture is important. Avoid slouching and forward head thrust. Maintain curve in low back and align ears over shoul- ders, hips over ankles.  Alternating Positions   Alternate tasks and change positions frequently to reduce fatigue and muscle tension. Take rest breaks. Computer Work   Position  work to face forward. Use proper work and seat height. Keep shoulders back and down, wrists straight, and elbows at right angles. Use chair that provides full back support. Add footrest and lumbar roll as needed.  Getting Into / Out of Car  Lower self onto seat, scoot back, then bring in one leg at a time. Reverse sequence to get out.  Dressing  Lie on back to pull socks or slacks over feet, or sit and bend leg while keeping back straight.    Housework - Sink  Place one foot on ledge of cabinet under sink when standing at sink for prolonged periods.   Pushing / Pulling  Pushing is preferable to pulling. Keep back in proper alignment, and use leg muscles to do the work.  Deep Squat   Squat and lift with both arms held against upper trunk. Tighten stomach muscles without holding breath. Use smooth movements to avoid jerking.  Avoid Twisting   Avoid twisting or bending back. Pivot around using foot movements, and bend at knees if needed when reaching for articles.  Carrying Luggage   Distribute weight evenly on both sides. Use a cart whenever possible. Do not twist trunk. Move body as a unit.   Lifting Principles .Maintain proper posture and head alignment. .Slide  object as close as possible before lifting. .Move obstacles out of the way. .Test before lifting; ask for help if too heavy. .Tighten stomach muscles without holding breath. .Use smooth movements; do not jerk. .Use legs to do the work, and pivot with feet. .Distribute the work load symmetrically and close to the center of trunk. .Push instead of pull whenever possible.   Ask For Help   Ask for help and delegate to others when possible. Coordinate your movements when lifting together, and maintain the low back curve.  Log Roll   Lying on back, bend left knee and place left arm across chest. Roll all in one movement to the right. Reverse to roll to the left. Always move as one unit. Housework - Sweeping  Use long-handled equipment to avoid stooping.   Housework - Wiping  Position yourself as close as possible to reach work surface. Avoid straining your back.  Laundry - Unloading Wash   To unload small items at bottom of washer, lift leg opposite to arm being used to reach.  [place] close to area to be raked. Use arm movements to do the work. Keep back straight and avoid twisting.     Cart  When reaching into cart with one arm, lift opposite leg to keep back straight.   Getting Into / Out of Bed  Lower self to lie down on one side by raising legs and lowering head at the same time. Use arms to assist moving without twisting. Bend both knees to roll onto back if desired. To sit up, start from lying on side, and use same move-ments in reverse. Housework - Vacuuming  Hold the vacuum with arm held at side. Step back and forth to move it, keeping head up. Avoid twisting.   Laundry - IT consultant so that bending and twisting can be avoided.   Laundry - Unloading Dryer  Squat down to reach into clothes dryer or use a reacher.  Gardening - Weeding / Probation officer or Kneel. Knee pads may be  helpful.

## 2017-08-17 ENCOUNTER — Encounter: Payer: Self-pay | Admitting: Physical Therapy

## 2017-08-17 ENCOUNTER — Ambulatory Visit: Payer: Medicare Other | Admitting: Physical Therapy

## 2017-08-17 DIAGNOSIS — M25651 Stiffness of right hip, not elsewhere classified: Secondary | ICD-10-CM | POA: Diagnosis not present

## 2017-08-17 DIAGNOSIS — M6281 Muscle weakness (generalized): Secondary | ICD-10-CM

## 2017-08-17 DIAGNOSIS — R208 Other disturbances of skin sensation: Secondary | ICD-10-CM | POA: Diagnosis not present

## 2017-08-17 DIAGNOSIS — M5440 Lumbago with sciatica, unspecified side: Secondary | ICD-10-CM

## 2017-08-17 NOTE — Therapy (Signed)
[place], [place], [address] Phone: [phone]   Fax:  [phone]  Physical Therapy Treatment  Patient Details  Name: Lori Mooney MRN: [id number] Date of Birth: 09-09-1945 Referring Provider: Dr. Joni Fears    Encounter Date: 08/17/2017  PT End of Session - 08/17/17 1428    Visit Number  7    Number of Visits  8    Date for PT Re-Evaluation  08/31/17    PT Start Time  1332    PT Stop Time  1415    PT Time Calculation (min)  43 min    Activity Tolerance  Patient tolerated treatment well    Behavior During Therapy  [facility] for tasks assessed/performed       Past Medical History:  Diagnosis Date  . Allergy   . 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 Cataract   . Exogenous obesity   . GERD (gastroesophageal reflux disease)   . Hyperlipidemia    hypercholesterolemia  . Hypertension     Past Surgical History:  Procedure Laterality Date  . CATARACT EXTRACTION W/ INTRAOCULAR LENS IMPLANT Left   . DILITATION & CURRETTAGE/HYSTROSCOPY WITH VERSAPOINT RESECTION N/A 08/25/2012   Procedure: DILATATION & CURETTAGE/HYSTEROSCOPY WITH VERSAPOINT RESECTION;  Surgeon: Princess Bruins, MD;  Location: [facility];  Service: Gynecology;  Laterality: N/A;  . EYE SURGERY    . TONSILLECTOMY    . TUBAL LIGATION      There were no vitals filed for this visit.  Subjective Assessment - 08/17/17 1347    Subjective  No pain today.  I have a question about exercises issued earlier.  I want to stretch today.  I am going to ask [name] to discharge next visit.      Currently in Pain?  No/denies    Pain Location  Back    Pain Frequency  Intermittent    Aggravating Factors   volunteering with longer walking    Pain Relieving Factors  change of position,  stretches,  Steriod shot                       [facility] Adult PT Treatment/Exercise - 08/17/17 0001      Lumbar Exercises: Stretches    Passive Hamstring Stretch  3 reps;30 seconds leg pain right ( stretch vs sciatica)    Single Knee to Chest Stretch  3 reps;30 seconds both    Lower Trunk Rotation  5 reps 10 seconds    Pelvic Tilt  10 reps    Piriformis Stretch  3 reps;20 seconds both      Lumbar Exercises: Supine   Clam  10 reps With TRA    Heel Slides  10 reps With TrA  cues    Bent Knee Raise  10 reps with TrA    Bridge  10 reps    Straight Leg Raise  10 reps    Other Supine Lumbar Exercises  Kegel handout did not practice.  zzzzzGived to educate difference from TRA    Other Supine Lumbar Exercises  TRA series,  mod cues all reviewed practiced             PT Education - 08/17/17 1414    Education Details  HEP,  and exercise form    Person(s) Educated  Patient    Methods  Explanation;Handout    Comprehension  Verbalized understanding;Returned demonstration  PT Long Term Goals - 08/17/17 1433      PT LONG TERM GOAL #1   Title  Pt will be I with HEP for core, trunk flexibility     Baseline  needs cues with HEP    Time  6    Period  Weeks    Status  On-going      PT LONG TERM GOAL #2   Title  Pt will be able to return to normal pre-injury exercise level (aerobics, walking) to continue to manage weight    Baseline  plans [employer] classes after the summer    Time  6    Period  Weeks    Status  On-going      PT LONG TERM GOAL #3   Title  Pt will be able to demo safe lifting , posture and body mechanics to prevent reinjury.     Time  6    Period  Weeks    Status  Unable to assess      PT LONG TERM GOAL #4   Title  Pt will be able to go shopping, walk in community without limitation of back, hip pain .      Baseline  walking limited in volunteer work    Time  6    Period  Weeks    Status  On-going      PT LONG TERM GOAL #5   Title  Pt will report resolution of numbness and groin pain.     Time  6    Period  Weeks    Status  Achieved            Plan - 08/17/17 1428     Clinical Impression Statement Patient uses good bed moility getting on and off mat. Patient is doing well with no pain.  She notes pain with stretching however upon questioning she feels a stretch.  She needed cues with HEP.  She thinks she will be ready for discharge after next visit.     PT Next Visit Plan  review TrA exercise series .  Check all goals.  D/C?    PT Home Exercise Plan  hamstring  piriformis and posterior pelvic tilt , L stab I , bridge and SLR core focus   Decompression.  TrA series    Consulted and Agree with Plan of Care  Patient       Patient will benefit from skilled therapeutic intervention in order to improve the following deficits and impairments:     Visit Diagnosis: Acute right-sided low back pain with sciatica, sciatica laterality unspecified  Muscle weakness (generalized)  Other disturbances of skin sensation  Stiffness of right hip, not elsewhere classified     Problem List Patient Active Problem List   Diagnosis Date Noted  . Osteoarthritis of spine with radiculopathy, lumbar region 06/25/2017  . Lumbar pain 06/25/2017  . Spinal stenosis of lumbar region without neurogenic claudication 06/25/2017  . Sciatica of right side 06/19/2017  . GERD (gastroesophageal reflux disease) 09/19/2013  . Vertigo 03/02/2012  . Hypercholesterolemia 10/08/2010  . Benign hypertensive heart disease without heart failure 10/08/2010  . Obesity 10/08/2010    Lori Mooney PTA 08/17/2017, 2:35 PM  [facility] [address] [place], [place], [address] Phone: [phone]   Fax:  [phone]  Name: Lori Mooney MRN: [id number] Date of Birth: Jan 10, 1946

## 2017-08-17 NOTE — Progress Notes (Signed)
Lori Mooney - 72 y.o. female MRN [id number]  Date of birth: 12/03/1945  Office Visit Note: Visit Date: 08/04/2017 PCP: Shawnee Knapp, MD Referred by: Shawnee Knapp, MD  Subjective: Chief Complaint  Patient presents with  . Lower Back - Pain  . Right Hip - Pain  . Left Hip - Pain   HPI: Lori Mooney is a very pleasant 72 year old female who comes in today at the request of Dr. Joni Fears for possible interventional spine procedure for her chronic worsening low back and bilateral hip and leg pain.  She had been having more right-sided complaints when she saw Dr. Durward Fortes when she went through medication management and physical therapy.  She ultimately had MRI of the lumbar spine performed which shows severe L3-4 multifactorial stenosis and moderate stenosis at L4-5.  She has gone through physical therapy with some relief but nothing dramatic.  Her pain started in May 2019.  She actually feels like walking helps her pain to some degree which is interesting with a level of stenosis.  She has had no focal weakness.  No red flag complaints.  We are going to complete bilateral L3 transforaminal epidural steroid injection diagnostically hopefully therapeutically.  Unfortunately she is likely going to be a surgical candidate for decompression.  She endorses contrast dye allergy but really is a shellfish allergy.  This is been proven not to be really cross-reactive and I think it safe to use contrast today.   ROS Otherwise per HPI.  Assessment & Plan: Visit Diagnoses:  1. Lumbar radiculopathy   2. Spinal stenosis of lumbar region with neurogenic claudication     Plan: No additional findings.   Meds & Orders:  Meds ordered this encounter  Medications  . betamethasone acetate-betamethasone sodium phosphate (CELESTONE) injection 12 mg    Orders Placed This Encounter  Procedures  . XR C-ARM NO REPORT  . Epidural Steroid injection    Follow-up: Return if symptoms worsen or fail to  improve.   Procedures: No procedures performed  Lumbosacral Transforaminal Epidural Steroid Injection - Sub-Pedicular Approach with Fluoroscopic Guidance  Patient: Lori Mooney      Date of Birth: [birth year] MRN: [id number] PCP: Shawnee Knapp, MD      Visit Date: 08/04/2017   Universal Protocol:    Date/Time: 08/04/2017  Consent Given By: the patient  Position: PRONE  Additional Comments: Vital signs were monitored before and after the procedure. Patient was prepped and draped in the usual sterile fashion. The correct patient, procedure, and site was verified.   Injection Procedure Details:  Procedure Site One Meds Administered:  Meds ordered this encounter  Medications  . betamethasone acetate-betamethasone sodium phosphate (CELESTONE) injection 12 mg    Laterality: Bilateral  Location/Site:  L3-L4  Needle size: 22 G  Needle type: Spinal  Needle Placement: Transforaminal  Findings:    -Comments: Excellent flow of contrast along the nerve and into the epidural space.  Procedure Details: After squaring off the end-plates to get a true AP view, the C-arm was positioned so that an oblique view of the foramen as noted above was visualized. The target area is just inferior to the "nose of the scotty dog" or sub pedicular. The soft tissues overlying this structure were infiltrated with 2-3 ml. of 1% Lidocaine without Epinephrine.  The spinal needle was inserted toward the target using a "trajectory" view along the fluoroscope beam.  Under AP and lateral visualization, the needle was advanced so it did not puncture  dura and was located close the 6 O'Clock position of the pedical in AP tracterory. Biplanar projections were used to confirm position. Aspiration was confirmed to be negative for CSF and/or blood. A 1-2 ml. volume of Isovue-250 was injected and flow of contrast was noted at each level. Radiographs were obtained for documentation purposes.   After attaining  the desired flow of contrast documented above, a 0.5 to 1.0 ml test dose of 0.25% Marcaine was injected into each respective transforaminal space.  The patient was observed for 90 seconds post injection.  After no sensory deficits were reported, and normal lower extremity motor function was noted,   the above injectate was administered so that equal amounts of the injectate were placed at each foramen (level) into the transforaminal epidural space.   Additional Comments:  The patient tolerated the procedure well Dressing: Band-Aid    Post-procedure details: Patient was observed during the procedure. Post-procedure instructions were reviewed.  Patient left the clinic in stable condition.    Clinical History: MRI LUMBAR SPINE WITHOUT CONTRAST  TECHNIQUE: Multiplanar, multisequence MR imaging of the lumbar spine was performed. No intravenous contrast was administered.  COMPARISON:  None.  FINDINGS: Segmentation:  Standard  Alignment: Grade 1 anterolisthesis at L3-4, facet mediated. Slight retrolisthesis at L5-S1  Vertebrae:  Degenerative type endplate edematous signal at L5-S1  Conus medullaris and cauda equina: Conus extends to the T12-L1 level. Conus appears normal. There is nerve root redundancy from L3-4 spinal stenosis. Minimal fat deposition within the filum terminalis.  Paraspinal and other soft tissues: Negative  Disc levels:  T12- L1: Unremarkable.  L1-L2: Mild disc narrowing and bulging  L2-L3: Facet degeneration with asymmetric left-sided spurring. The disc is narrowed and bulging. Left subarticular recess stenosis with left L3 impingement. Patent foramina  L3-L4: Facet arthropathy with anterolisthesis and hypertrophy. The disc is narrowed and bulging with endplate ridging. High-grade spinal stenosis. There is near complete effacement of CSF and the neighboring nerve roots are redundant. Patent foramina  L4-L5: Disc narrowing and bulging with  degenerative posterior element hypertrophy. Spinal stenosis is moderate. Moderate right foraminal narrowing.  L5-S1:Greatest level of degenerative disc narrowing with bulging of the residual disc and endplate spurring. Mild facet spurring. The canal is patent. Moderate right more than left foraminal narrowing  IMPRESSION: 1. Advanced multilevel degenerative disease as described. 2. Spinal stenosis is advanced at L3-4 and moderate to advanced at L4-5. 3. Moderate foraminal narrowing on the right at L4-5 and right more than left at L5-S1. 4. L2-3 left subarticular recess stenosis which could affect the left L3 nerve root.   Electronically Signed   By: Monte Fantasia M.D.   On: 06/23/2017 10:14   She reports that she has never smoked. She has never used smokeless tobacco. No results for input(s): HGBA1C, LABURIC in the last 8760 hours.  Objective:  VS:  HT:    WT:   BMI:     BP:(!) 138/98  HR:87bpm  TEMP: ( )  RESP:  Physical Exam  Ortho Exam Imaging: No results found.  Past Medical/Family/Surgical/Social History: Medications & Allergies reviewed per EMR, new medications updated. Patient Active Problem List   Diagnosis Date Noted  . Osteoarthritis of spine with radiculopathy, lumbar region 06/25/2017  . Lumbar pain 06/25/2017  . Spinal stenosis of lumbar region without neurogenic claudication 06/25/2017  . Sciatica of right side 06/19/2017  . GERD (gastroesophageal reflux disease) 09/19/2013  . Vertigo 03/02/2012  . Hypercholesterolemia 10/08/2010  . Benign hypertensive heart disease without heart  failure 10/08/2010  . Obesity 10/08/2010   Past Medical History:  Diagnosis Date  . Allergy   . 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 Cataract   . Exogenous obesity   . GERD (gastroesophageal reflux disease)   . Hyperlipidemia    hypercholesterolemia  . Hypertension    Family History  Problem Relation Age of Onset  . Hyperlipidemia Mother   .  Heart disease Mother   . Hypertension Mother   . Hypertension Father   . Hyperlipidemia Father   . Hyperlipidemia Sister   . Hypertension Sister    Past Surgical History:  Procedure Laterality Date  . CATARACT EXTRACTION W/ INTRAOCULAR LENS IMPLANT Left   . DILITATION & CURRETTAGE/HYSTROSCOPY WITH VERSAPOINT RESECTION N/A 08/25/2012   Procedure: DILATATION & CURETTAGE/HYSTEROSCOPY WITH VERSAPOINT RESECTION;  Surgeon: Princess Bruins, MD;  Location: [facility];  Service: Gynecology;  Laterality: N/A;  . EYE SURGERY    . TONSILLECTOMY    . TUBAL LIGATION     Social History   Occupational History  . Occupation: Retired Therapist, sports  Tobacco Use  . Smoking status: Never Smoker  . Smokeless tobacco: Never Used  Substance and Sexual Activity  . Alcohol use: No    Alcohol/week: 0.0 oz  . Drug use: No  . Sexual activity: Yes

## 2017-08-17 NOTE — Procedures (Signed)
Lumbosacral Transforaminal Epidural Steroid Injection - Sub-Pedicular Approach with Fluoroscopic Guidance  Patient: Lori Mooney      Date of Birth: [birth year] MRN: [id number] PCP: Lori Knapp, MD      Visit Date: 08/04/2017   Universal Protocol:    Date/Time: 08/04/2017  Consent Given By: the patient  Position: PRONE  Additional Comments: Vital signs were monitored before and after the procedure. Patient was prepped and draped in the usual sterile fashion. The correct patient, procedure, and site was verified.   Injection Procedure Details:  Procedure Site One Meds Administered:  Meds ordered this encounter  Medications  . betamethasone acetate-betamethasone sodium phosphate (CELESTONE) injection 12 mg    Laterality: Bilateral  Location/Site:  L3-L4  Needle size: 22 G  Needle type: Spinal  Needle Placement: Transforaminal  Findings:    -Comments: Excellent flow of contrast along the nerve and into the epidural space.  Procedure Details: After squaring off the end-plates to get a true AP view, the C-arm was positioned so that an oblique view of the foramen as noted above was visualized. The target area is just inferior to the "nose of the scotty dog" or sub pedicular. The soft tissues overlying this structure were infiltrated with 2-3 ml. of 1% Lidocaine without Epinephrine.  The spinal needle was inserted toward the target using a "trajectory" view along the fluoroscope beam.  Under AP and lateral visualization, the needle was advanced so it did not puncture dura and was located close the 6 O'Clock position of the pedical in AP tracterory. Biplanar projections were used to confirm position. Aspiration was confirmed to be negative for CSF and/or blood. A 1-2 ml. volume of Isovue-250 was injected and flow of contrast was noted at each level. Radiographs were obtained for documentation purposes.   After attaining the desired flow of contrast documented above, a 0.5  to 1.0 ml test dose of 0.25% Marcaine was injected into each respective transforaminal space.  The patient was observed for 90 seconds post injection.  After no sensory deficits were reported, and normal lower extremity motor function was noted,   the above injectate was administered so that equal amounts of the injectate were placed at each foramen (level) into the transforaminal epidural space.   Additional Comments:  The patient tolerated the procedure well Dressing: Band-Aid    Post-procedure details: Patient was observed during the procedure. Post-procedure instructions were reviewed.  Patient left the clinic in stable condition.

## 2017-08-17 NOTE — Patient Instructions (Addendum)
Kegel    Exhaling, squeeze pelvic floor muscles as if to hold something in the vaginal opening. Inhaling, release. Can be done sitting, standing, side-lying, or knee-chest. Practice in a position that is comfortable and switch positions for variety. Repeat _5__ times. Do _2-3__ times per day.  Copyright  VHI. All rights reserved.  Transversus Abdominus handout from Exercise drawer Daily 10 x each Hold 5 seconds or for duration of movement  all issued

## 2017-08-20 ENCOUNTER — Encounter: Payer: Self-pay | Admitting: Physical Therapy

## 2017-08-20 ENCOUNTER — Ambulatory Visit: Payer: Medicare Other | Admitting: Physical Therapy

## 2017-08-20 DIAGNOSIS — M6281 Muscle weakness (generalized): Secondary | ICD-10-CM | POA: Diagnosis not present

## 2017-08-20 DIAGNOSIS — M5440 Lumbago with sciatica, unspecified side: Secondary | ICD-10-CM | POA: Diagnosis not present

## 2017-08-20 DIAGNOSIS — R208 Other disturbances of skin sensation: Secondary | ICD-10-CM

## 2017-08-20 DIAGNOSIS — M25651 Stiffness of right hip, not elsewhere classified: Secondary | ICD-10-CM | POA: Diagnosis not present

## 2017-08-20 NOTE — Therapy (Signed)
[place], [place], [address] Phone: [phone]   Fax:  [phone]  Physical Therapy Treatment/Discharge  Patient Details  Name: Lori Mooney MRN: [id number] Date of Birth: 1945-10-19 Referring Provider: Dr. Joni Fears    Encounter Date: 08/20/2017  PT End of Session - 08/20/17 1043    Visit Number  8    Number of Visits  8    Date for PT Re-Evaluation  08/31/17    PT Start Time  1020    PT Stop Time  1104    PT Time Calculation (min)  44 min    Activity Tolerance  Patient tolerated treatment well    Behavior During Therapy  [facility] for tasks assessed/performed       Past Medical History:  Diagnosis Date  . Allergy   . 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 Cataract   . Exogenous obesity   . GERD (gastroesophageal reflux disease)   . Hyperlipidemia    hypercholesterolemia  . Hypertension     Past Surgical History:  Procedure Laterality Date  . CATARACT EXTRACTION W/ INTRAOCULAR LENS IMPLANT Left   . DILITATION & CURRETTAGE/HYSTROSCOPY WITH VERSAPOINT RESECTION N/A 08/25/2012   Procedure: DILATATION & CURETTAGE/HYSTEROSCOPY WITH VERSAPOINT RESECTION;  Surgeon: Princess Bruins, MD;  Location: [facility];  Service: Gynecology;  Laterality: N/A;  . EYE SURGERY    . TONSILLECTOMY    . TUBAL LIGATION      There were no vitals filed for this visit.  Subjective Assessment - 08/20/17 1024    Subjective  Its my last day.  I need the code for one of my HEP sheets. took a walk about 25 min the other day.     Currently in Pain?  No/denies           [facility] Adult PT Treatment/Exercise - 08/20/17 0001      Self-Care   Other Self-Care Comments   supine to sit to supine, log rolling  HEP final , technique      Lumbar Exercises: Stretches   Active Hamstring Stretch  2 reps;30 seconds    Single Knee to Chest Stretch  2 reps;30 seconds    Lower Trunk Rotation  5 reps;10 seconds     Pelvic Tilt  10 reps    Piriformis Stretch  3 reps;20 seconds both      Lumbar Exercises: Aerobic   Tread Mill  2.5 mph 6 min       Lumbar Exercises: Supine   Clam  15 reps    Bent Knee Raise  15 reps    Bridge  10 reps    Bridge with clamshell  10 reps    Straight Leg Raise  10 reps                  PT Long Term Goals - 08/20/17 1046      PT LONG TERM GOAL #1   Title  Pt will be I with HEP for core, trunk flexibility     Status  Achieved      PT LONG TERM GOAL #2   Title  Pt will be able to return to normal pre-injury exercise level (aerobics, walking) to continue to manage weight    Baseline  plans [employer] classes after the summer    Status  Partially Met      PT LONG TERM GOAL #3   Title  Pt will be able to demo  safe lifting , posture and body mechanics to prevent reinjury.     Status  Achieved      PT LONG TERM GOAL #4   Title  Pt will be able to go shopping, walk in community without limitation of back, hip pain .      Baseline  walking limited in volunteer work    Status  Partially Met      PT LONG TERM GOAL #5   Title  Pt will report resolution of numbness and groin pain.     Status  Achieved            Plan - 08/20/17 1240    Clinical Impression Statement  All LTG met and is ready for DC.     PT Next Visit Plan  NA     PT Home Exercise Plan  hamstring  piriformis and posterior pelvic tilt , L stab I , bridge and SLR core focus   Decompression.  TrA series    Consulted and Agree with Plan of Care  Patient       Patient will benefit from skilled therapeutic intervention in order to improve the following deficits and impairments:     Visit Diagnosis: Acute right-sided low back pain with sciatica, sciatica laterality unspecified  Muscle weakness (generalized)  Other disturbances of skin sensation  Stiffness of right hip, not elsewhere classified     Problem List Patient Active Problem List   Diagnosis Date Noted  . Osteoarthritis  of spine with radiculopathy, lumbar region 06/25/2017  . Lumbar pain 06/25/2017  . Spinal stenosis of lumbar region without neurogenic claudication 06/25/2017  . Sciatica of right side 06/19/2017  . GERD (gastroesophageal reflux disease) 09/19/2013  . Vertigo 03/02/2012  . Hypercholesterolemia 10/08/2010  . Benign hypertensive heart disease without heart failure 10/08/2010  . Obesity 10/08/2010    PAA,JENNIFER 08/20/2017, 12:44 PM  [facility] [address] [place], [place], [address] Phone: [phone]   Fax:  [phone]  Name: Lori Mooney MRN: [id number] Date of Birth: 1946/01/02  PHYSICAL THERAPY DISCHARGE SUMMARY  Visits from Start of Care: 8 Current functional level related to goals / functional outcomes: See above    Remaining deficits: None limiting function   Education / Equipment: HEP, body mechanics, posture, core  Plan: Patient agrees to discharge.  Patient goals were met. Patient is being discharged due to meeting the stated rehab goals.  ?????     Raeford Razor, PT 08/20/17 12:44 PM Phone: [phone] Fax: [phone]

## 2017-08-24 ENCOUNTER — Ambulatory Visit: Payer: Medicare Other | Admitting: Physical Therapy

## 2017-08-26 ENCOUNTER — Encounter: Payer: Medicare Other | Admitting: Physical Therapy

## 2017-09-03 ENCOUNTER — Telehealth: Payer: Self-pay | Admitting: Family Medicine

## 2017-09-03 NOTE — Telephone Encounter (Signed)
Called pt to reschedule their appt 10/14/17. Left VM

## 2017-09-14 ENCOUNTER — Other Ambulatory Visit: Payer: Self-pay | Admitting: Family Medicine

## 2017-09-25 ENCOUNTER — Other Ambulatory Visit: Payer: Self-pay | Admitting: Emergency Medicine

## 2017-09-25 DIAGNOSIS — M545 Low back pain, unspecified: Secondary | ICD-10-CM

## 2017-09-28 NOTE — Telephone Encounter (Signed)
Meloxicam refill Last Refill:06/25/17 #30 Last OV: 06/25/17 PCP: Dr Delman Cheadle Pharmacy:[facility] [phone]

## 2017-10-01 ENCOUNTER — Telehealth: Payer: Self-pay | Admitting: Family Medicine

## 2017-10-09 DIAGNOSIS — M170 Bilateral primary osteoarthritis of knee: Secondary | ICD-10-CM | POA: Diagnosis not present

## 2017-10-09 DIAGNOSIS — M25561 Pain in right knee: Secondary | ICD-10-CM | POA: Insufficient documentation

## 2017-10-09 DIAGNOSIS — M25512 Pain in left shoulder: Secondary | ICD-10-CM | POA: Diagnosis not present

## 2017-10-09 DIAGNOSIS — M25562 Pain in left knee: Secondary | ICD-10-CM | POA: Diagnosis not present

## 2017-10-13 ENCOUNTER — Encounter: Payer: Medicare Other | Admitting: Family Medicine

## 2017-10-14 ENCOUNTER — Encounter: Payer: Medicare Other | Admitting: Family Medicine

## 2017-10-19 ENCOUNTER — Ambulatory Visit (INDEPENDENT_AMBULATORY_CARE_PROVIDER_SITE_OTHER): Payer: Medicare Other | Admitting: Physician Assistant

## 2017-10-19 ENCOUNTER — Encounter: Payer: Self-pay | Admitting: Physician Assistant

## 2017-10-19 ENCOUNTER — Other Ambulatory Visit: Payer: Self-pay

## 2017-10-19 VITALS — BP 120/70 | HR 99 | Temp 98.0°F | Resp 16 | Ht 61.5 in | Wt 199.8 lb

## 2017-10-19 DIAGNOSIS — E785 Hyperlipidemia, unspecified: Secondary | ICD-10-CM

## 2017-10-19 DIAGNOSIS — E559 Vitamin D deficiency, unspecified: Secondary | ICD-10-CM

## 2017-10-19 DIAGNOSIS — R7303 Prediabetes: Secondary | ICD-10-CM

## 2017-10-19 DIAGNOSIS — Z0000 Encounter for general adult medical examination without abnormal findings: Secondary | ICD-10-CM | POA: Diagnosis not present

## 2017-10-19 DIAGNOSIS — Z1329 Encounter for screening for other suspected endocrine disorder: Secondary | ICD-10-CM

## 2017-10-19 LAB — POCT URINALYSIS DIP (MANUAL ENTRY)
Bilirubin, UA: NEGATIVE
Blood, UA: NEGATIVE
Glucose, UA: NEGATIVE mg/dL
Ketones, POC UA: NEGATIVE mg/dL
Nitrite, UA: NEGATIVE
Protein Ur, POC: NEGATIVE mg/dL
Spec Grav, UA: 1.02 (ref 1.010–1.025)
Urobilinogen, UA: 0.2 E.U./dL
pH, UA: 6.5 (ref 5.0–8.0)

## 2017-10-19 NOTE — Progress Notes (Signed)
Presents today for [employer] Visit-Subsequent. This is my first time seeing this pt. PCP is Dr. Brigitte Pulse.  Patient volunteers in the ER and nurse  Date of last exam: 07/20/2016  Interpreter used for this visit? no  Patient Care Team: Shawnee Knapp, MD as PCP - General (Family Medicine) Rutherford Guys, MD as Consulting Physician (Ophthalmology) Jerline Pain, MD as Consulting Physician (Cardiology) Ronald Lobo, MD as Consulting Physician (Gastroenterology) Particia Nearing, MD as Attending Physician (Dermatology)  Other items to address today:  Vitamin b12 pills D3 1000  Left shoulder impinged and does cortisone injection every few years. Left knee OA. Sees Dr. Cephus Slater for injections.  Mother with angioplasty into Rt leg at 33 - still independent living. She has a pacemaker. In [place].  Cancer Screening: Cervical: yes 2018. Negative.  Breast: yes 05/2017 Colon: yes due 06/09/2021  Other Screening: Last screening for diabetes: 2017 Last lipid screening: 06/2016 Lab Results  Component Value Date   HGBA1C 5.7 (H) 12/16/2015   Lab Results  Component Value Date   CHOL 185 07/20/2016   CHOL 171 12/16/2015   CHOL 182 06/20/2015   Lab Results  Component Value Date   HDL 61 07/20/2016   HDL 59 12/16/2015   HDL 72 06/20/2015   Lab Results  Component Value Date   LDLCALC 85 07/20/2016   LDLCALC 76 12/16/2015   LDLCALC 81 06/20/2015   Lab Results  Component Value Date   TRIG 194 (H) 07/20/2016   TRIG 182 (H) 12/16/2015   TRIG 145 06/20/2015   Lab Results  Component Value Date   CHOLHDL 3.0 07/20/2016   CHOLHDL 2.9 12/16/2015   CHOLHDL 2.5 06/20/2015   Lab Results  Component Value Date   LDLDIRECT 95.2 09/06/2012    ADVANCE DIRECTIVES: Discussed: yes Patient desires CPR (Yes ), mechanical ventilation (Yes ), prolonged artificial support (may include mechanical ventilation, tube/PEG feeding, etc) (No ). On File: no Materials Provided: no. Pt has  this information at home, but not on file.   Immunization status:  Immunization History  Administered Date(s) Administered  . Influenza Nasal 10/16/2017  . Influenza Whole 11/07/2011  . Influenza,inj,Quad PF,6+ Mos 10/28/2012  . Influenza-Unspecified 10/26/2013, 10/15/2014, 11/10/2015, 10/19/2016  . Pneumococcal Conjugate-13 10/30/2014  . Pneumococcal Polysaccharide-23 07/20/2016  . Td 03/20/2009    There are no preventive care reminders to display for this patient.  Functional Status Survey: Pt performs low impact aerobics 3x/wk - 6-7 yrs  Is the patient deaf or have difficulty hearing?: No Does the patient have difficulty seeing, even when wearing glasses/contacts?: No Does the patient have difficulty concentrating, remembering, or making decisions?: No Does the patient have difficulty walking or climbing stairs?: No Does the patient have difficulty dressing or bathing?: No Does the patient have difficulty doing errands alone such as visiting a doctor's office or shopping?: No Clinical Intake - 10/19/17 1418      Nutrition Screen   BMI - recorded  37.15    Diabetes  No      Functional Status   Activities of Daily Living  Independent    Ambulation  Independent    Medication [place] Management  Independent      Abuse/Neglect   Do you feel unsafe in your current relationship?  No    Do you feel physically threatened by others?  No    Anyone hurting you at home, work, or school?  No    Unable  to ask?  No    Information provided on Community resources  No      Patient Literacy   How often do you need to have someone help you when you read instructions, pamphlets, or other written materials from your doctor or pharmacy?  1 - Never    What is the last grade level you completed in school?  4 years of college      Home Environment: lives at home with husband.   Urinary Incontinence Screening: occasional incontinence   Patient Active Problem  List   Diagnosis Date Noted  . Osteoarthritis of spine with radiculopathy, lumbar region 06/25/2017  . Lumbar pain 06/25/2017  . Spinal stenosis of lumbar region without neurogenic claudication 06/25/2017  . Sciatica of right side 06/19/2017  . GERD (gastroesophageal reflux disease) 09/19/2013  . Vertigo 03/02/2012  . Hypercholesterolemia 10/08/2010  . Benign hypertensive heart disease without heart failure 10/08/2010  . Obesity 10/08/2010    Past Medical History:  Diagnosis Date  . Allergy   . 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 Cataract   . Exogenous obesity   . GERD (gastroesophageal reflux disease)   . Hyperlipidemia    hypercholesterolemia  . Hypertension      Past Surgical History:  Procedure Laterality Date  . CATARACT EXTRACTION W/ INTRAOCULAR LENS IMPLANT Left   . DILITATION & CURRETTAGE/HYSTROSCOPY WITH VERSAPOINT RESECTION N/A 08/25/2012   Procedure: DILATATION & CURETTAGE/HYSTEROSCOPY WITH VERSAPOINT RESECTION;  Surgeon: Princess Bruins, MD;  Location: [facility];  Service: Gynecology;  Laterality: N/A;  . EYE SURGERY    . TONSILLECTOMY    . TUBAL LIGATION       Family History  Problem Relation Age of Onset  . Hyperlipidemia Mother   . Heart disease Mother   . Hypertension Mother   . Hypertension Father   . Hyperlipidemia Father   . Hyperlipidemia Sister   . Hypertension Sister      Social History   Socioeconomic History  . Marital status: Married    Spouse name: Not on file  . Number of children: Not on file  . Years of education: Not on file  . Highest education level: Not on file  Occupational History  . Occupation: Retired Animal nutritionist  . Financial resource strain: Not on file  . Food insecurity:    Worry: Not on file    Inability: Not on file  . Transportation needs:    Medical: Not on file    Non-medical: Not on file  Tobacco Use  . Smoking status: Never Smoker  . Smokeless tobacco: Never Used  Substance and  Sexual Activity  . Alcohol use: Yes    Alcohol/week: 1.0 - 2.0 standard drinks    Types: 1 - 2 Standard drinks or equivalent per week  . Drug use: No  . Sexual activity: Yes  Lifestyle  . Physical activity:    Days per week: Not on file    Minutes per session: Not on file  . Stress: Not on file  Relationships  . Social connections:    Talks on phone: Not on file    Gets together: Not on file    Attends religious service: Not on file    Active member of club or organization: Not on file    Attends meetings of clubs or organizations: Not on file    Relationship status: Not on file  . Intimate partner violence:    Fear of current or  ex partner: Not on file    Emotionally abused: Not on file    Physically abused: Not on file    Forced sexual activity: Not on file  Other Topics Concern  . Not on file  Social History Narrative   Married. Education: [employer].     Allergies  Allergen Reactions  . Shellfish Allergy Hives  . Strawberry Extract Hives     Prior to Admission medications   Medication Sig Start Date End Date Taking? Authorizing Provider  acetaminophen (TYLENOL) 325 MG tablet Take 650 mg by mouth every 6 (six) hours as needed.   Yes [provider]  atorvastatin (LIPITOR) 20 MG tablet TAKE 1 TABLET BY MOUTH ONCE DAILY 09/14/17  Yes Shawnee Knapp, MD  Cholecalciferol (VITAMIN D-3) 1000 UNITS CAPS Take 1,000 Units by mouth daily.   Yes [provider]  Cyanocobalamin (VITAMIN B12 PO) Take 1 tablet by mouth daily. Reported on 06/20/2015   Yes [provider]  ibuprofen (ADVIL,MOTRIN) 200 MG tablet Take 400 mg by mouth every 6 (six) hours as needed for pain.   Yes [provider]  lisinopril-hydrochlorothiazide (PRINZIDE,ZESTORETIC) 20-12.5 MG tablet TAKE 1 TABLET BY MOUTH DAILY 09/14/17  Yes Shawnee Knapp, MD  loratadine (CLARITIN) 10 MG tablet Take 10 mg by mouth daily.   Yes [provider]  Magnesium 250 MG TABS Take 250-500 mg by  mouth at bedtime. 250 mg for sleep and 500 mg for constipation   Yes [provider]  meclizine (ANTIVERT) 12.5 MG tablet Take 1 tablet (12.5 mg total) by mouth 3 (three) times daily as needed for dizziness. 02/26/14  Yes [name], MD  meloxicam (MOBIC) 15 MG tablet TAKE 1 TABLET BY MOUTH ONCE DAILY 09/28/17  Yes Shawnee Knapp, MD  Ranitidine HCl (ZANTAC PO) Take 1 tablet by mouth daily as needed (Takes one tablet by mouth for GERD). For heartburn   Yes [provider]  traMADol (ULTRAM) 50 MG tablet Take 1 tablet (50 mg total) by mouth every 8 (eight) hours as needed for severe pain. Patient not taking: Reported on 10/19/2017 06/25/17   Horald Pollen, MD     Depression screen [facility] 2/9 10/19/2017 06/25/2017 06/19/2017 03/08/2017 12/28/2016  Decreased Interest 0 0 0 0 0  Down, Depressed, Hopeless 0 0 0 0 0  PHQ - 2 Score 0 0 0 0 0    Fall Risk  10/19/2017 06/25/2017 06/19/2017 03/08/2017 12/28/2016  Falls in the past year? No No No No No    PHYSICAL EXAM: BP 120/70 (BP Location: Left Arm, Patient Position: Sitting, Cuff Size: Normal)   Pulse 99   Temp 98 F (36.7 C) (Oral)   Resp 16   Ht 5' 1.5" (1.562 m)   Wt 199 lb 12.8 oz (90.6 kg)   SpO2 99%   BMI 37.14 kg/m    Wt Readings from Last 3 Encounters:  10/19/17 199 lb 12.8 oz (90.6 kg)  07/02/17 198 lb (89.8 kg)  06/25/17 198 lb (89.8 kg)      Visual Acuity Screening   Right eye Left eye Both eyes  Without correction:     With correction: '20/30 20/30 20/30 '      Physical Exam  Vitals reviewed. Constitutional: She is oriented to person, place, and time. She appears well-developed and well-nourished.  Eyes: Pupils are equal, round, and reactive to light. Conjunctivae and EOM are normal.  Neck: Normal range of motion. Neck supple. No thyromegaly present.  Cardiovascular: Normal  rate and regular rhythm.  Respiratory: Effort normal and breath sounds normal. She has no wheezes. She exhibits no tenderness.    GI: Soft. Bowel sounds are normal. There is no tenderness.  Musculoskeletal: Normal range of motion.  Neurological: She is alert and oriented to person, place, and time. She has normal reflexes.  Skin: Skin is warm and dry.  Psychiatric: She has a normal mood and affect. Her behavior is normal. Judgment and thought content normal.   Results for orders placed or performed in visit on 10/19/17  POCT urinalysis dipstick  Result Value Ref Range   Color, UA yellow yellow   Clarity, UA clear clear   Glucose, UA negative negative mg/dL   Bilirubin, UA negative negative   Ketones, POC UA negative negative mg/dL   Spec Grav, UA 1.020 1.010 - 1.025   Blood, UA negative negative   pH, UA 6.5 5.0 - 8.0   Protein Ur, POC negative negative mg/dL   Urobilinogen, UA 0.2 0.2 or 1.0 E.U./dL   Nitrite, UA Negative Negative   Leukocytes, UA Small (1+) (A) Negative    ASSESSMENT/PLAN: 1. Encounter for Medicare annual wellness exam - Pt here for medicare wellness visit. She declines shingles vaccine today. Education/Counseling provided regarding diet and exercise, prevention of chronic diseases and reviewed "Covered Medicare Preventive Services." She will bring back advanced medical directives to have scanned into chart.   2. Hyperlipidemia, unspecified hyperlipidemia type - Lipid panel  3. Vitamin D deficiency - VITAMIN D 25 Hydroxy (Vit-D Deficiency, Fractures)  4. Prediabetes - Hemoglobin A1c  5. Screening for endocrine disorder - CMP14+EGFR - POCT urinalysis dipstick  6. Screening for thyroid disorder - TSH  Mercer Pod, PA-C  [facility] [place] 10/19/2017 2:22 PM

## 2017-10-19 NOTE — Patient Instructions (Addendum)
Please bring back your Advanced Medical Directives so we can scan it in to have on file for you.    If you have lab work done today you will be contacted with your lab results within the next 2 weeks.  If you have not heard from [place] then please contact us. The fastest way to get your results is to register for My Chart.  Health Maintenance, Female Adopting a healthy lifestyle and getting preventive care can go a long way to promote health and wellness. Talk with your health care provider about what schedule of regular examinations is right for you. This is a good chance for you to check in with your provider about disease prevention and staying healthy. In between checkups, there are plenty of things you can do on your own. Experts have done a lot of research about which lifestyle changes and preventive measures are most likely to keep you healthy. Ask your health care provider for more information. Weight and diet Eat a healthy diet  Be sure to include plenty of vegetables, fruits, low-fat dairy products, and lean protein.  Do not eat a lot of foods high in solid fats, added sugars, or salt.  Get regular exercise. This is one of the most important things you can do for your health. ? Most adults should exercise for at least 150 minutes each week. The exercise should increase your heart rate and make you sweat (moderate-intensity exercise). ? Most adults should also do strengthening exercises at least twice a week. This is in addition to the moderate-intensity exercise.  Maintain a healthy weight  Body mass index (BMI) is a measurement that can be used to identify possible weight problems. It estimates body fat based on height and weight. Your health care provider can help determine your BMI and help you achieve or maintain a healthy weight.  For females 33 years of age and older: ? A BMI below 18.5 is considered underweight. ? A BMI of 18.5 to 24.9 is normal. ? A BMI of 25 to 29.9 is  considered overweight. ? A BMI of 30 and above is considered obese.  Watch levels of cholesterol and blood lipids  You should start having your blood tested for lipids and cholesterol at 72 years of age, then have this test every 5 years.  You may need to have your cholesterol levels checked more often if: ? Your lipid or cholesterol levels are high. ? You are older than 72 years of age. ? You are at high risk for heart disease.  Cancer screening Lung Cancer  Lung cancer screening is recommended for adults 76-33 years old who are at high risk for lung cancer because of a history of smoking.  A yearly low-dose CT scan of the lungs is recommended for people who: ? Currently smoke. ? Have quit within the past 15 years. ? Have at least a 30-pack-year history of smoking. A pack year is smoking an average of one pack of cigarettes a day for 1 year.  Yearly screening should continue until it has been 15 years since you quit.  Yearly screening should stop if you develop a health problem that would prevent you from having lung cancer treatment.  Breast Cancer  Practice breast self-awareness. This means understanding how your breasts normally appear and feel.  It also means doing regular breast self-exams. Let your health care provider know about any changes, no matter how small.  If you are in your 20s or 30s, you should  have a clinical breast exam (CBE) by a health care provider every 1-3 years as part of a regular health exam.  If you are 80 or older, have a CBE every year. Also consider having a breast X-ray (mammogram) every year.  If you have a family history of breast cancer, talk to your health care provider about genetic screening.  If you are at high risk for breast cancer, talk to your health care provider about having an MRI and a mammogram every year.  Breast cancer gene (BRCA) assessment is recommended for women who have family members with BRCA-related cancers.  BRCA-related cancers include: ? Breast. ? Ovarian. ? Tubal. ? Peritoneal cancers.  Results of the assessment will determine the need for genetic counseling and BRCA1 and BRCA2 testing.  Cervical Cancer Your health care provider may recommend that you be screened regularly for cancer of the pelvic organs (ovaries, uterus, and vagina). This screening involves a pelvic examination, including checking for microscopic changes to the surface of your cervix (Pap test). You may be encouraged to have this screening done every 3 years, beginning at age 59.  For women ages 47-65, health care providers may recommend pelvic exams and Pap testing every 3 years, or they may recommend the Pap and pelvic exam, combined with testing for human papilloma virus (HPV), every 5 years. Some types of HPV increase your risk of cervical cancer. Testing for HPV may also be done on women of any age with unclear Pap test results.  Other health care providers may not recommend any screening for nonpregnant women who are considered low risk for pelvic cancer and who do not have symptoms. Ask your health care provider if a screening pelvic exam is right for you.  If you have had past treatment for cervical cancer or a condition that could lead to cancer, you need Pap tests and screening for cancer for at least 20 years after your treatment. If Pap tests have been discontinued, your risk factors (such as having a new sexual partner) need to be reassessed to determine if screening should resume. Some women have medical problems that increase the chance of getting cervical cancer. In these cases, your health care provider may recommend more frequent screening and Pap tests.  Colorectal Cancer  This type of cancer can be detected and often prevented.  Routine colorectal cancer screening usually begins at 72 years of age and continues through 72 years of age.  Your health care provider may recommend screening at an earlier age if  you have risk factors for colon cancer.  Your health care provider may also recommend using home test kits to check for hidden blood in the stool.  A small camera at the end of a tube can be used to examine your colon directly (sigmoidoscopy or colonoscopy). This is done to check for the earliest forms of colorectal cancer.  Routine screening usually begins at age 99.  Direct examination of the colon should be repeated every 5-10 years through 72 years of age. However, you may need to be screened more often if early forms of precancerous polyps or small growths are found.  Skin Cancer  Check your skin from head to toe regularly.  Tell your health care provider about any new moles or changes in moles, especially if there is a change in a mole's shape or color.  Also tell your health care provider if you have a mole that is larger than the size of a pencil eraser.  Always  use sunscreen. Apply sunscreen liberally and repeatedly throughout the day.  Protect yourself by wearing long sleeves, pants, a wide-brimmed hat, and sunglasses whenever you are outside.  Heart disease, diabetes, and high blood pressure  High blood pressure causes heart disease and increases the risk of stroke. High blood pressure is more likely to develop in: ? People who have blood pressure in the high end of the normal range (130-139/85-89 mm Hg). ? People who are overweight or obese. ? People who are African American.  If you are 18-27 years of age, have your blood pressure checked every 3-5 years. If you are 32 years of age or older, have your blood pressure checked every year. You should have your blood pressure measured twice-once when you are at a hospital or clinic, and once when you are not at a hospital or clinic. Record the average of the two measurements. To check your blood pressure when you are not at a hospital or clinic, you can use: ? An automated blood pressure machine at a pharmacy. ? A home blood  pressure monitor.  If you are between 72 years and 79 years old, ask your health care provider if you should take aspirin to prevent strokes.  Have regular diabetes screenings. This involves taking a blood sample to check your fasting blood sugar level. ? If you are at a normal weight and have a low risk for diabetes, have this test once every three years after 72 years of age. ? If you are overweight and have a high risk for diabetes, consider being tested at a younger age or more often. Preventing infection Hepatitis B  If you have a higher risk for hepatitis B, you should be screened for this virus. You are considered at high risk for hepatitis B if: ? You were born in a country where hepatitis B is common. Ask your health care provider which countries are considered high risk. ? Your parents were born in a high-risk country, and you have not been immunized against hepatitis B (hepatitis B vaccine). ? You have HIV or AIDS. ? You use needles to inject street drugs. ? You live with someone who has hepatitis B. ? You have had sex with someone who has hepatitis B. ? You get hemodialysis treatment. ? You take certain medicines for conditions, including cancer, organ transplantation, and autoimmune conditions.  Hepatitis C  Blood testing is recommended for: ? Everyone born from [birth year] through 1965. ? Anyone with known risk factors for hepatitis C.  Sexually transmitted infections (STIs)  You should be screened for sexually transmitted infections (STIs) including gonorrhea and chlamydia if: ? You are sexually active and are younger than 72 years of age. ? You are older than 72 years of age and your health care provider tells you that you are at risk for this type of infection. ? Your sexual activity has changed since you were last screened and you are at an increased risk for chlamydia or gonorrhea. Ask your health care provider if you are at risk.  If you do not have HIV, but are at risk,  it may be recommended that you take a prescription medicine daily to prevent HIV infection. This is called pre-exposure prophylaxis (PrEP). You are considered at risk if: ? You are sexually active and do not regularly use condoms or know the HIV status of your partner(s). ? You take drugs by injection. ? You are sexually active with a partner who has HIV.  Talk with your  health care provider about whether you are at high risk of being infected with HIV. If you choose to begin PrEP, you should first be tested for HIV. You should then be tested every 3 months for as long as you are taking PrEP. Pregnancy  If you are premenopausal and you may become pregnant, ask your health care provider about preconception counseling.  If you may become pregnant, take 400 to 800 micrograms (mcg) of folic acid every day.  If you want to prevent pregnancy, talk to your health care provider about birth control (contraception). Osteoporosis and menopause  Osteoporosis is a disease in which the bones lose minerals and strength with aging. This can result in serious bone fractures. Your risk for osteoporosis can be identified using a bone density scan.  If you are 79 years of age or older, or if you are at risk for osteoporosis and fractures, ask your health care provider if you should be screened.  Ask your health care provider whether you should take a calcium or vitamin D supplement to lower your risk for osteoporosis.  Menopause may have certain physical symptoms and risks.  Hormone replacement therapy may reduce some of these symptoms and risks. Talk to your health care provider about whether hormone replacement therapy is right for you. Follow these instructions at home:  Schedule regular health, dental, and eye exams.  Stay current with your immunizations.  Do not use any tobacco products including cigarettes, chewing tobacco, or electronic cigarettes.  If you are pregnant, do not drink  alcohol.  If you are breastfeeding, limit how much and how often you drink alcohol.  Limit alcohol intake to no more than 1 drink per day for nonpregnant women. One drink equals 12 ounces of beer, 5 ounces of wine, or 1 ounces of hard liquor.  Do not use street drugs.  Do not share needles.  Ask your health care provider for help if you need support or information about quitting drugs.  Tell your health care provider if you often feel depressed.  Tell your health care provider if you have ever been abused or do not feel safe at home. This information is not intended to replace advice given to you by your health care provider. Make sure you discuss any questions you have with your health care provider. Document Released: 07/28/2010 Document Revised: 06/20/2015 Document Reviewed: 10/16/2014 Elsevier Interactive Patient Education  2018 [employer].  IF you received an x-ray today, you will receive an invoice from [facility]. Please contact [facility] at [phone] with questions or concerns regarding your invoice.   IF you received labwork today, you will receive an invoice from [facility] [facility]. Please contact [facility] at [phone] with questions or concerns regarding your invoice.   Our billing staff will not be able to assist you with questions regarding bills from these companies.  You will be contacted with the lab results as soon as they are available. The fastest way to get your results is to activate your My Chart account. Instructions are located on the last page of this paperwork. If you have not heard from [place] regarding the results in 2 weeks, please contact this office.

## 2017-10-20 LAB — LIPID PANEL
Chol/HDL Ratio: 2.6 ratio (ref 0.0–4.4)
Cholesterol, Total: 181 mg/dL (ref 100–199)
HDL: 69 mg/dL (ref >39)
LDL Calculated: 65 mg/dL (ref 0–99)
Triglycerides: 233 mg/dL — ABNORMAL HIGH (ref 0–149)
VLDL Cholesterol Cal: 47 mg/dL — ABNORMAL HIGH (ref 5–40)

## 2017-10-20 LAB — CMP14+EGFR
ALT: 18 IU/L (ref 0–32)
AST: 11 IU/L (ref 0–40)
Albumin/Globulin Ratio: 1.9 (ref 1.2–2.2)
Albumin: 4.2 g/dL (ref 3.5–4.8)
Alkaline Phosphatase: 61 IU/L (ref 39–117)
BUN/Creatinine Ratio: 17 (ref 12–28)
BUN: 16 mg/dL (ref 8–27)
Bilirubin Total: 0.7 mg/dL (ref 0.0–1.2)
CO2: 22 mmol/L (ref 20–29)
Calcium: 9.8 mg/dL (ref 8.7–10.3)
Chloride: 98 mmol/L (ref 96–106)
Creatinine, Ser: 0.94 mg/dL (ref 0.57–1.00)
GFR calc Af Amer: 71 mL/min/{1.73_m2} (ref >59)
GFR calc non Af Amer: 61 mL/min/{1.73_m2} (ref >59)
Globulin, Total: 2.2 g/dL (ref 1.5–4.5)
Glucose: 73 mg/dL (ref 65–99)
Potassium: 4 mmol/L (ref 3.5–5.2)
Sodium: 137 mmol/L (ref 134–144)
Total Protein: 6.4 g/dL (ref 6.0–8.5)

## 2017-10-20 LAB — HEMOGLOBIN A1C
Est. average glucose Bld gHb Est-mCnc: 126 mg/dL
Hgb A1c MFr Bld: 6 % — ABNORMAL HIGH (ref 4.8–5.6)

## 2017-10-20 LAB — VITAMIN D 25 HYDROXY (VIT D DEFICIENCY, FRACTURES): Vit D, 25-Hydroxy: 40.6 ng/mL (ref 30.0–100.0)

## 2017-10-20 LAB — TSH: TSH: 3.03 u[IU]/mL (ref 0.450–4.500)

## 2017-11-02 NOTE — Progress Notes (Signed)
Results released to mychart.  Labs look great.  Prediabetes - advised lifestyle changes Recheck in 1 year.

## 2017-11-17 ENCOUNTER — Other Ambulatory Visit: Payer: Self-pay | Admitting: Family Medicine

## 2017-11-17 DIAGNOSIS — M545 Low back pain, unspecified: Secondary | ICD-10-CM

## 2017-11-18 NOTE — Telephone Encounter (Signed)
DONE

## 2017-11-22 DIAGNOSIS — M1712 Unilateral primary osteoarthritis, left knee: Secondary | ICD-10-CM | POA: Diagnosis not present

## 2017-12-02 DIAGNOSIS — M1712 Unilateral primary osteoarthritis, left knee: Secondary | ICD-10-CM | POA: Diagnosis not present

## 2017-12-09 DIAGNOSIS — M1712 Unilateral primary osteoarthritis, left knee: Secondary | ICD-10-CM | POA: Diagnosis not present

## 2018-01-17 DIAGNOSIS — M25561 Pain in right knee: Secondary | ICD-10-CM | POA: Diagnosis not present

## 2018-01-17 DIAGNOSIS — M1712 Unilateral primary osteoarthritis, left knee: Secondary | ICD-10-CM | POA: Diagnosis not present

## 2018-01-17 DIAGNOSIS — M25562 Pain in left knee: Secondary | ICD-10-CM | POA: Diagnosis not present

## 2018-02-22 DIAGNOSIS — L720 Epidermal cyst: Secondary | ICD-10-CM | POA: Diagnosis not present

## 2018-02-22 DIAGNOSIS — D2261 Melanocytic nevi of right upper limb, including shoulder: Secondary | ICD-10-CM | POA: Diagnosis not present

## 2018-02-22 DIAGNOSIS — L570 Actinic keratosis: Secondary | ICD-10-CM | POA: Diagnosis not present

## 2018-02-22 DIAGNOSIS — L814 Other melanin hyperpigmentation: Secondary | ICD-10-CM | POA: Diagnosis not present

## 2018-02-22 DIAGNOSIS — Z85828 Personal history of other malignant neoplasm of skin: Secondary | ICD-10-CM | POA: Diagnosis not present

## 2018-02-22 DIAGNOSIS — L918 Other hypertrophic disorders of the skin: Secondary | ICD-10-CM | POA: Diagnosis not present

## 2018-02-22 DIAGNOSIS — L821 Other seborrheic keratosis: Secondary | ICD-10-CM | POA: Diagnosis not present

## 2018-02-22 DIAGNOSIS — D1801 Hemangioma of skin and subcutaneous tissue: Secondary | ICD-10-CM | POA: Diagnosis not present

## 2018-04-07 DIAGNOSIS — Z961 Presence of intraocular lens: Secondary | ICD-10-CM | POA: Diagnosis not present

## 2018-04-07 DIAGNOSIS — H5213 Myopia, bilateral: Secondary | ICD-10-CM | POA: Diagnosis not present

## 2018-04-07 DIAGNOSIS — H524 Presbyopia: Secondary | ICD-10-CM | POA: Diagnosis not present

## 2018-04-07 DIAGNOSIS — H52203 Unspecified astigmatism, bilateral: Secondary | ICD-10-CM | POA: Diagnosis not present

## 2018-04-07 DIAGNOSIS — H35372 Puckering of macula, left eye: Secondary | ICD-10-CM | POA: Diagnosis not present

## 2018-04-07 DIAGNOSIS — H25811 Combined forms of age-related cataract, right eye: Secondary | ICD-10-CM | POA: Diagnosis not present

## 2018-06-03 ENCOUNTER — Other Ambulatory Visit: Payer: Self-pay | Admitting: Family Medicine

## 2018-06-03 DIAGNOSIS — Z1231 Encounter for screening mammogram for malignant neoplasm of breast: Secondary | ICD-10-CM

## 2018-07-11 DIAGNOSIS — M1712 Unilateral primary osteoarthritis, left knee: Secondary | ICD-10-CM | POA: Diagnosis not present

## 2018-07-25 DIAGNOSIS — M1712 Unilateral primary osteoarthritis, left knee: Secondary | ICD-10-CM | POA: Diagnosis not present

## 2018-07-28 ENCOUNTER — Ambulatory Visit
Admission: RE | Admit: 2018-07-28 | Discharge: 2018-07-28 | Disposition: A | Discharge status: Home / Self Care | Payer: Medicare Other | Source: Ambulatory Visit | Attending: Family Medicine | Admitting: Family Medicine

## 2018-07-28 ENCOUNTER — Other Ambulatory Visit: Payer: Self-pay

## 2018-07-28 DIAGNOSIS — Z1231 Encounter for screening mammogram for malignant neoplasm of breast: Secondary | ICD-10-CM | POA: Diagnosis not present

## 2018-08-09 DIAGNOSIS — M1712 Unilateral primary osteoarthritis, left knee: Secondary | ICD-10-CM | POA: Diagnosis not present

## 2018-08-10 ENCOUNTER — Ambulatory Visit (INDEPENDENT_AMBULATORY_CARE_PROVIDER_SITE_OTHER): Payer: Medicare Other | Admitting: Family Medicine

## 2018-08-10 ENCOUNTER — Encounter: Payer: Self-pay | Admitting: Family Medicine

## 2018-08-10 ENCOUNTER — Other Ambulatory Visit: Payer: Self-pay

## 2018-08-10 VITALS — BP 112/78 | HR 98 | Temp 98.4°F | Resp 16 | Ht 63.5 in | Wt 205.0 lb

## 2018-08-10 DIAGNOSIS — Z01818 Encounter for other preprocedural examination: Secondary | ICD-10-CM

## 2018-08-10 DIAGNOSIS — R7303 Prediabetes: Secondary | ICD-10-CM | POA: Diagnosis not present

## 2018-08-10 DIAGNOSIS — E781 Pure hyperglyceridemia: Secondary | ICD-10-CM

## 2018-08-10 NOTE — Progress Notes (Signed)
Established Patient Office Visit  Subjective:  Patient ID: Lori Mooney, female    DOB: 07-05-1945  Age: 73 y.o. MRN: [id number]  CC:  Chief Complaint  Patient presents with  . medical clearance    knee l replacement     HPI Lori Mooney presents for   Patient reports that she will be having Left TKA with Dr. Gladstone Lighter She reports that she has pain but avoids tramadol  She would rate her pain as a 1/10 She states that she cannot walk around the block and any walking her pain gets to 8/10   Past Medical History:  Diagnosis Date  . Allergy   . 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 Cataract   . Exogenous obesity   . GERD (gastroesophageal reflux disease)   . Hyperlipidemia    hypercholesterolemia  . Hypertension     Past Surgical History:  Procedure Laterality Date  . CATARACT EXTRACTION W/ INTRAOCULAR LENS IMPLANT Left   . DILITATION & CURRETTAGE/HYSTROSCOPY WITH VERSAPOINT RESECTION N/A 08/25/2012   Procedure: DILATATION & CURETTAGE/HYSTEROSCOPY WITH VERSAPOINT RESECTION;  Surgeon: Princess Bruins, MD;  Location: [facility];  Service: Gynecology;  Laterality: N/A;  . EYE SURGERY    . TONSILLECTOMY    . TUBAL LIGATION      Family History  Problem Relation Age of Onset  . Hyperlipidemia Mother   . Heart disease Mother   . Hypertension Mother   . Hypertension Father   . Hyperlipidemia Father   . Hyperlipidemia Sister   . Hypertension Sister     Social History   Socioeconomic History  . Marital status: Married    Spouse name: Not on file  . Number of children: Not on file  . Years of education: Not on file  . Highest education level: Not on file  Occupational History  . Occupation: Retired Animal nutritionist  . Financial resource strain: Not on file  . Food insecurity    Worry: Not on file    Inability: Not on file  . Transportation needs    Medical: Not on file    Non-medical: Not on file  Tobacco Use  . Smoking status:  Never Smoker  . Smokeless tobacco: Never Used  Substance and Sexual Activity  . Alcohol use: Yes    Alcohol/week: 1.0 - 2.0 standard drinks    Types: 1 - 2 Standard drinks or equivalent per week  . Drug use: No  . Sexual activity: Yes  Lifestyle  . Physical activity    Days per week: Not on file    Minutes per session: Not on file  . Stress: Not on file  Relationships  . Social Herbalist on phone: Not on file    Gets together: Not on file    Attends religious service: Not on file    Active member of club or organization: Not on file    Attends meetings of clubs or organizations: Not on file    Relationship status: Not on file  . Intimate partner violence    Fear of current or ex partner: Not on file    Emotionally abused: Not on file    Physically abused: Not on file    Forced sexual activity: Not on file  Other Topics Concern  . Not on file  Social History Narrative   Married. Education: [employer].    Outpatient Medications Prior to Visit  Medication Sig Dispense Refill  .  acetaminophen (TYLENOL) 325 MG tablet Take 650 mg by mouth every 6 (six) hours as needed.    [name] atorvastatin (LIPITOR) 20 MG tablet TAKE 1 TABLET BY MOUTH ONCE DAILY 90 tablet 3  . famotidine (PEPCID) 10 MG tablet Take 10 mg by mouth 2 (two) times daily.    [name] ibuprofen (ADVIL,MOTRIN) 200 MG tablet Take 400 mg by mouth every 6 (six) hours as needed for pain.    [name] lisinopril-hydrochlorothiazide (PRINZIDE,ZESTORETIC) 20-12.5 MG tablet TAKE 1 TABLET BY MOUTH DAILY 90 tablet 3  . loratadine (CLARITIN) 10 MG tablet Take 10 mg by mouth daily.    . Magnesium 250 MG TABS Take 250-500 mg by mouth at bedtime. 250 mg for sleep and 500 mg for constipation    . meloxicam (MOBIC) 15 MG tablet TAKE 1 TABLET BY MOUTH ONCE DAILY 90 tablet 2  . Cholecalciferol (VITAMIN D-3) 1000 UNITS CAPS Take 1,000 Units by mouth daily.    . Cyanocobalamin (VITAMIN B12 PO) Take 1 tablet by mouth daily. Reported on 06/20/2015    .  Ranitidine HCl (ZANTAC PO) Take 1 tablet by mouth daily as needed (Takes one tablet by mouth for GERD). For heartburn    . meclizine (ANTIVERT) 12.5 MG tablet Take 1 tablet (12.5 mg total) by mouth 3 (three) times daily as needed for dizziness. (Patient not taking: Reported on 08/10/2018) 30 tablet 0  . traMADol (ULTRAM) 50 MG tablet Take 1 tablet (50 mg total) by mouth every 8 (eight) hours as needed for severe pain. (Patient not taking: Reported on 10/19/2017) 20 tablet 0   No facility-administered medications prior to visit.     Allergies  Allergen Reactions  . Shellfish Allergy Hives  . Strawberry Extract Hives    ROS Review of Systems Review of Systems  Constitutional: Negative for activity change, appetite change, chills and fever.  HENT: Negative for congestion, nosebleeds, trouble swallowing and voice change.   Respiratory: Negative for cough, shortness of breath and wheezing.   Gastrointestinal: Negative for diarrhea, nausea and vomiting.  Genitourinary: Negative for difficulty urinating, dysuria, flank pain and hematuria.  Musculoskeletal: occasional back pain, +left knee pain and no neck pain.  Neurological: Negative for dizziness, speech difficulty, light-headedness and numbness.  See HPI. All other review of systems negative.     Objective:    Physical Exam  BP 112/78 (BP Location: Left Arm, Patient Position: Sitting, Cuff Size: Normal)   Pulse 98   Temp 98.4 F (36.9 C) (Oral)   Resp 16   Ht 5' 3.5" (1.613 m)   Wt 205 lb (93 kg)   SpO2 96%   BMI 35.74 kg/m  Wt Readings from Last 3 Encounters:  08/10/18 205 lb (93 kg)  10/19/17 199 lb 12.8 oz (90.6 kg)  07/02/17 198 lb (89.8 kg)   Physical Exam  Constitutional: Oriented to person, place, and time. Appears well-developed and well-nourished.  HENT:  Head: Normocephalic and atraumatic.  Eyes: Conjunctivae and EOM are normal.  Cardiovascular: Normal rate, regular rhythm, normal heart sounds and intact distal  pulses.  No murmur heard. Pulmonary/Chest: Effort normal and breath sounds normal. No stridor. No respiratory distress. Has no wheezes.  Abdomen: nondistended, normoactive bs, soft, nontender Musculoskeletal: no effusion or edema, deformity of the left knee without erythema Neurological: Is alert and oriented to person, place, and time.  Skin: Skin is warm. Capillary refill takes less than 2 seconds.  Psychiatric: Has a normal mood and affect. Behavior is normal. Judgment and thought content normal.  ECG - unchanged compared to 2017, sinus rhythm noted   There are no preventive care reminders to display for this patient.  There are no preventive care reminders to display for this patient.  Lab Results  Component Value Date   TSH 3.030 10/19/2017   Lab Results  Component Value Date   WBC 6.4 07/20/2016   HGB 15.2 07/20/2016   HCT 44.7 07/20/2016   MCV 88 07/20/2016   PLT 299 07/20/2016   Lab Results  Component Value Date   NA 137 10/19/2017   K 4.0 10/19/2017   CO2 22 10/19/2017   GLUCOSE 73 10/19/2017   BUN 16 10/19/2017   CREATININE 0.94 10/19/2017   BILITOT 0.7 10/19/2017   ALKPHOS 61 10/19/2017   AST 11 10/19/2017   ALT 18 10/19/2017   PROT 6.4 10/19/2017   ALBUMIN 4.2 10/19/2017   CALCIUM 9.8 10/19/2017   GFR 64.35 10/02/2014   Lab Results  Component Value Date   CHOL 181 10/19/2017   Lab Results  Component Value Date   HDL 69 10/19/2017   Lab Results  Component Value Date   LDLCALC 65 10/19/2017   Lab Results  Component Value Date   TRIG 233 (H) 10/19/2017   Lab Results  Component Value Date   CHOLHDL 2.6 10/19/2017   Lab Results  Component Value Date   HGBA1C 6.0 (H) 10/19/2017      Assessment & Plan:   Problem List Items Addressed This Visit    None    Visit Diagnoses    Pre-op testing    -  Primary Discussed risk factors, patient low risk for cardiac disease   Relevant Orders   EKG 12-Lead (Completed)   CMP14+EGFR   Lipid  panel   Urine Microscopic   CBC   Prediabetes    -  Will check for diabetes today    Relevant Orders   Hemoglobin A1c   Hypertriglyceridemia    -  Reviewed previous lipid panel, will check while patient is fasting   Relevant Orders   Lipid panel      Patient is a nonsmoker, who is not on blood thinners, she has stable ECG Labs are  Urine micro negat Patient is   For surgery and will fax to [facility]  No orders of the defined types were placed in this encounter.   Follow-up: No follow-ups on file.    Forrest Moron, MD

## 2018-08-11 LAB — CBC
Hematocrit: 45.3 % (ref 34.0–46.6)
Hemoglobin: 15.4 g/dL (ref 11.1–15.9)
MCH: 30.3 pg (ref 26.6–33.0)
MCHC: 34 g/dL (ref 31.5–35.7)
MCV: 89 fL (ref 79–97)
Platelets: 305 10*3/uL (ref 150–450)
RBC: 5.08 x10E6/uL (ref 3.77–5.28)
RDW: 13.5 % (ref 11.7–15.4)
WBC: 7.5 10*3/uL (ref 3.4–10.8)

## 2018-08-11 LAB — LIPID PANEL
Chol/HDL Ratio: 3 ratio (ref 0.0–4.4)
Cholesterol, Total: 188 mg/dL (ref 100–199)
HDL: 62 mg/dL (ref >39)
LDL Calculated: 85 mg/dL (ref 0–99)
Triglycerides: 203 mg/dL — ABNORMAL HIGH (ref 0–149)
VLDL Cholesterol Cal: 41 mg/dL — ABNORMAL HIGH (ref 5–40)

## 2018-08-11 LAB — HEMOGLOBIN A1C
Est. average glucose Bld gHb Est-mCnc: 123 mg/dL
Hgb A1c MFr Bld: 5.9 % — ABNORMAL HIGH (ref 4.8–5.6)

## 2018-08-11 LAB — URINALYSIS, MICROSCOPIC ONLY: Casts: NONE SEEN /lpf

## 2018-08-11 LAB — CMP14+EGFR
ALT: 20 IU/L (ref 0–32)
AST: 18 IU/L (ref 0–40)
Albumin/Globulin Ratio: 2 (ref 1.2–2.2)
Albumin: 4.6 g/dL (ref 3.7–4.7)
Alkaline Phosphatase: 74 IU/L (ref 39–117)
BUN/Creatinine Ratio: 15 (ref 12–28)
BUN: 13 mg/dL (ref 8–27)
Bilirubin Total: 0.5 mg/dL (ref 0.0–1.2)
CO2: 21 mmol/L (ref 20–29)
Calcium: 10 mg/dL (ref 8.7–10.3)
Chloride: 99 mmol/L (ref 96–106)
Creatinine, Ser: 0.87 mg/dL (ref 0.57–1.00)
GFR calc Af Amer: 77 mL/min/{1.73_m2} (ref >59)
GFR calc non Af Amer: 67 mL/min/{1.73_m2} (ref >59)
Globulin, Total: 2.3 g/dL (ref 1.5–4.5)
Glucose: 81 mg/dL (ref 65–99)
Potassium: 4.1 mmol/L (ref 3.5–5.2)
Sodium: 137 mmol/L (ref 134–144)
Total Protein: 6.9 g/dL (ref 6.0–8.5)

## 2018-08-12 ENCOUNTER — Telehealth: Payer: Self-pay

## 2018-08-12 ENCOUNTER — Encounter: Payer: Self-pay | Admitting: Family Medicine

## 2018-08-12 NOTE — Telephone Encounter (Signed)
Pt [facility] form completed and sent along with EKG and labs to attn: Glendale Chard at [phone] and [phone].  Confirmation faxes received.  Pt requesting original be sent via mail to home address.  I advised I will send it via mail. Dgaddy, CMA

## 2018-08-14 ENCOUNTER — Encounter: Payer: Self-pay | Admitting: Family Medicine

## 2018-08-20 ENCOUNTER — Other Ambulatory Visit (HOSPITAL_COMMUNITY)
Admission: RE | Admit: 2018-08-20 | Discharge: 2018-08-20 | Disposition: A | Discharge status: Home / Self Care | Payer: Medicare Other | Source: Ambulatory Visit | Attending: Orthopedic Surgery | Admitting: Orthopedic Surgery

## 2018-08-20 DIAGNOSIS — Z1159 Encounter for screening for other viral diseases: Secondary | ICD-10-CM | POA: Diagnosis not present

## 2018-08-20 LAB — SARS CORONAVIRUS 2 (TAT 6-24 HRS): SARS Coronavirus 2: NEGATIVE

## 2018-08-23 ENCOUNTER — Encounter (HOSPITAL_COMMUNITY)
Admission: RE | Admit: 2018-08-23 | Discharge: 2018-08-23 | Disposition: A | Discharge status: Home / Self Care | Payer: Medicare Other | Source: Ambulatory Visit | Attending: Orthopedic Surgery | Admitting: Orthopedic Surgery

## 2018-08-23 ENCOUNTER — Encounter (HOSPITAL_COMMUNITY): Payer: Self-pay

## 2018-08-23 ENCOUNTER — Other Ambulatory Visit: Payer: Self-pay

## 2018-08-23 DIAGNOSIS — Z6835 Body mass index (BMI) 35.0-35.9, adult: Secondary | ICD-10-CM | POA: Diagnosis not present

## 2018-08-23 DIAGNOSIS — E7800 Pure hypercholesterolemia, unspecified: Secondary | ICD-10-CM | POA: Diagnosis not present

## 2018-08-23 DIAGNOSIS — Z79899 Other long term (current) drug therapy: Secondary | ICD-10-CM | POA: Diagnosis not present

## 2018-08-23 DIAGNOSIS — M24562 Contracture, left knee: Secondary | ICD-10-CM | POA: Diagnosis not present

## 2018-08-23 DIAGNOSIS — M1712 Unilateral primary osteoarthritis, left knee: Secondary | ICD-10-CM | POA: Insufficient documentation

## 2018-08-23 DIAGNOSIS — K219 Gastro-esophageal reflux disease without esophagitis: Secondary | ICD-10-CM | POA: Diagnosis not present

## 2018-08-23 DIAGNOSIS — E785 Hyperlipidemia, unspecified: Secondary | ICD-10-CM | POA: Diagnosis not present

## 2018-08-23 DIAGNOSIS — Z01818 Encounter for other preprocedural examination: Secondary | ICD-10-CM | POA: Insufficient documentation

## 2018-08-23 DIAGNOSIS — E669 Obesity, unspecified: Secondary | ICD-10-CM | POA: Diagnosis not present

## 2018-08-23 DIAGNOSIS — I119 Hypertensive heart disease without heart failure: Secondary | ICD-10-CM | POA: Diagnosis not present

## 2018-08-23 LAB — CBC
HCT: 46.8 % — ABNORMAL HIGH (ref 36.0–46.0)
Hemoglobin: 14.8 g/dL (ref 12.0–15.0)
MCH: 28.8 pg (ref 26.0–34.0)
MCHC: 31.6 g/dL (ref 30.0–36.0)
MCV: 91.1 fL (ref 80.0–100.0)
Platelets: 299 10*3/uL (ref 150–400)
RBC: 5.14 MIL/uL — ABNORMAL HIGH (ref 3.87–5.11)
RDW: 13.6 % (ref 11.5–15.5)
WBC: 6.5 10*3/uL (ref 4.0–10.5)
nRBC: 0 % (ref 0.0–0.2)

## 2018-08-23 LAB — SURGICAL PCR SCREEN
MRSA, PCR: NEGATIVE
Staphylococcus aureus: NEGATIVE

## 2018-08-23 LAB — PROTIME-INR
INR: 1 (ref 0.8–1.2)
Prothrombin Time: 12.6 seconds (ref 11.4–15.2)

## 2018-08-23 LAB — APTT: aPTT: 28 seconds (ref 24–36)

## 2018-08-23 LAB — ABO/RH: ABO/RH(D): A POS

## 2018-08-23 MED ORDER — BUPIVACAINE LIPOSOME 1.3 % IJ SUSP
20.0000 mL | Freq: Once | INTRAMUSCULAR | Status: DC
  Filled 2018-08-23: qty 20

## 2018-08-23 NOTE — H&P (Signed)
TOTAL KNEE ADMISSION H&P  Patient is being admitted for left total knee arthroplasty.  Subjective:  Chief Complaint:left knee pain.  HPI: Lori Mooney [facility], 73 y.o. female, has a history of pain and functional disability in the left knee due to arthritis and has failed non-surgical conservative treatments for greater than 12 weeks to includeNSAID's and/or analgesics, corticosteriod injections, viscosupplementation injections, flexibility and strengthening excercises and activity modification.  Onset of symptoms was gradual, starting 5 years ago with gradually worsening course since that time. The patient noted no past surgery on the left knee(s).  Patient currently rates pain in the left knee(s) at 3 out of 10 with activity. Patient has night pain, worsening of pain with activity and weight bearing, pain that interferes with activities of daily living, pain with passive range of motion, crepitus and joint swelling.  Patient has evidence of periarticular osteophytes and joint space narrowing by imaging studies. There is no active infection.  Patient Active Problem List   Diagnosis Date Noted  . Osteoarthritis of spine with radiculopathy, lumbar region 06/25/2017  . Lumbar pain 06/25/2017  . Spinal stenosis of lumbar region without neurogenic claudication 06/25/2017  . Sciatica of right side 06/19/2017  . GERD (gastroesophageal reflux disease) 09/19/2013  . Vertigo 03/02/2012  . Hypercholesterolemia 10/08/2010  . Benign hypertensive heart disease without heart failure 10/08/2010  . Obesity 10/08/2010   Past Medical History:  Diagnosis Date  . Allergy   . 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 Cataract   . Exogenous obesity   . GERD (gastroesophageal reflux disease)   . Hyperlipidemia    hypercholesterolemia  . Hypertension     Past Surgical History:  Procedure Laterality Date  . CATARACT EXTRACTION W/ INTRAOCULAR LENS IMPLANT Left   . DILITATION &  CURRETTAGE/HYSTROSCOPY WITH VERSAPOINT RESECTION N/A 08/25/2012   Procedure: DILATATION & CURETTAGE/HYSTEROSCOPY WITH VERSAPOINT RESECTION;  Surgeon: Princess Bruins, MD;  Location: [facility];  Service: Gynecology;  Laterality: N/A;  . EYE SURGERY    . TONSILLECTOMY    . TUBAL LIGATION      Current Facility-Administered Medications  Medication Dose Route Frequency Provider Last Rate Last Dose  . [START ON 08/24/2018] bupivacaine liposome (EXPAREL) 1.3 % injection 266 mg  20 mL Other Once [name], MD       Current Outpatient Medications  Medication Sig Dispense Refill Last Dose  . acetaminophen (TYLENOL) 325 MG tablet Take 650 mg by mouth every 6 (six) hours as needed.     [name] atorvastatin (LIPITOR) 20 MG tablet TAKE 1 TABLET BY MOUTH ONCE DAILY (Patient taking differently: Take 20 mg by mouth every evening. ) 90 tablet 3   . Cholecalciferol (VITAMIN D-3) 1000 UNITS CAPS Take 1,000 Units by mouth daily.     . famotidine (PEPCID) 10 MG tablet Take 10 mg by mouth at bedtime.      [name] lisinopril-hydrochlorothiazide (PRINZIDE,ZESTORETIC) 20-12.5 MG tablet TAKE 1 TABLET BY MOUTH DAILY 90 tablet 3   . loratadine (CLARITIN) 10 MG tablet Take 10 mg by mouth daily as needed for allergies.      . meloxicam (MOBIC) 15 MG tablet TAKE 1 TABLET BY MOUTH ONCE DAILY (Patient taking differently: Take 15 mg by mouth daily as needed for pain. ) 90 tablet 2    Allergies  Allergen Reactions  . Shellfish Allergy Hives  . Strawberry Extract Hives    Social History   Tobacco Use  . Smoking status: Never Smoker  . Smokeless tobacco:  Never Used  Substance Use Topics  . Alcohol use: Yes    Alcohol/week: 1.0 - 2.0 standard drinks    Types: 1 - 2 Standard drinks or equivalent per week    Family History  Problem Relation Age of Onset  . Hyperlipidemia Mother   . Heart disease Mother   . Hypertension Mother   . Hypertension Father   . Hyperlipidemia Father   . Hyperlipidemia Sister   . Hypertension  Sister      Review of Systems  Constitutional: Negative.   HENT: Negative.   Eyes: Negative.   Respiratory: Negative.   Cardiovascular: Negative.   Gastrointestinal: Positive for heartburn. Negative for abdominal pain, blood in stool, constipation, diarrhea, melena, nausea and vomiting.  Genitourinary: Negative.   Musculoskeletal: Positive for joint pain and myalgias. Negative for back pain, falls and neck pain.  Skin: Negative.   Neurological: Negative.   Endo/Heme/Allergies: Negative.   Psychiatric/Behavioral: Negative.     Objective:  Physical Exam  Constitutional: She is oriented to person, place, and time. She appears well-developed. No distress.  Morbidly obese  HENT:  Head: Normocephalic and atraumatic.  Right Ear: External ear normal.  Left Ear: External ear normal.  Nose: Nose normal.  Mouth/Throat: Oropharynx is clear and moist.  Eyes: Conjunctivae and EOM are normal.  Neck: Normal range of motion. Neck supple.  Cardiovascular: Normal rate, regular rhythm, normal heart sounds and intact distal pulses.  No murmur heard. Respiratory: Effort normal and breath sounds normal. No respiratory distress. She has no wheezes.  GI: Soft. Bowel sounds are normal. She exhibits no distension. There is no abdominal tenderness.  Musculoskeletal:     Right hip: Normal.     Left hip: Normal.     Right knee: Normal.     Comments: Left knee: ROM 5-80 degrees. Tender along the medial joint line. Mild effusion noted. No instability. Moderate patellofemoral crepitus. Normal painless motion of the left hip.  Neurological: She is oriented to person, place, and time. She has normal strength. No sensory deficit.  Skin: No rash noted. She is not diaphoretic. No erythema.  Psychiatric: She has a normal mood and affect. Her behavior is normal.    Ht: 5 ft 3 in  Wt: 205 lbs BMI: 36.3  BP: 132/84 sitting R arm  Pulse: 76 bpm   Imaging Review Plain radiographs demonstrate severe  degenerative joint disease of the left knee(s). The overall alignment ismild varus. The bone quality appears to be good for age and reported activity level.    Assessment/Plan:  End stage primary osteoarthritis, left knee   The patient history, physical examination, clinical judgment of the provider and imaging studies are consistent with end stage degenerative joint disease of the left knee(s) and total knee arthroplasty is deemed medically necessary. The treatment options including medical management, injection therapy arthroscopy and arthroplasty were discussed at length. The risks and benefits of total knee arthroplasty were presented and reviewed. The risks due to aseptic loosening, infection, stiffness, patella tracking problems, thromboembolic complications and other imponderables were discussed. The patient acknowledged the explanation, agreed to proceed with the plan and consent was signed. Patient is being admitted for inpatient treatment for surgery, pain control, PT, OT, prophylactic antibiotics, VTE prophylaxis, progressive ambulation and ADL's and discharge planning. The patient is planning to be discharged outpatient therapy at [facility].   Therapy Plans: outpatient therapy at [facility] 8/3 Disposition: Home with husband Planned DVT prophylaxis: aspirin 325mg  BID DME needed: walker PCP: Dr. Delia Chimes Other:  no anesthesia concerns  Instructed on medications to stop prior to surgery  Questions and concerns were welcomed and addressed. If they have any further questions or concerns that arise, they are instructed to call the office at any time.  Anticipated LOS equal to or greater than 2 midnights due to - Age 25 and older with one or more of the following:  - Obesity  - Expected need for hospital services (PT, OT, Nursing) required for safe  discharge  - Anticipated need for postoperative skilled nursing care or inpatient rehab  - Active co-morbidities: None OR   -  Unanticipated findings during/Post Surgery: None  - Patient is a high risk of re-admission due to: None   [employer], PA-C

## 2018-08-23 NOTE — Anesthesia Preprocedure Evaluation (Signed)
Anesthesia Evaluation  Patient identified by MRN, date of birth, ID band Patient awake    Reviewed: Allergy & Precautions, H&P , NPO status , Patient's Chart, lab work & pertinent test results  Airway Mallampati: II  TM Distance: >3 FB Neck ROM: Full    Dental no notable dental hx. (+) Teeth Intact   Pulmonary neg pulmonary ROS,    Pulmonary exam normal breath sounds clear to auscultation       Cardiovascular hypertension, Pt. on medications negative cardio ROS Normal cardiovascular exam Rhythm:Regular Rate:Normal     Neuro/Psych negative neurological ROS  negative psych ROS   GI/Hepatic negative GI ROS, Neg liver ROS, GERD  Medicated and Controlled,  Endo/Other  negative endocrine ROS  Renal/GU negative Renal ROS  negative genitourinary   Musculoskeletal negative musculoskeletal ROS (+)   Abdominal Normal abdominal exam  (+) + obese,   Peds  Hematology negative hematology ROS (+)   Anesthesia Other Findings   Reproductive/Obstetrics Endometrial polyp                             Anesthesia Physical  Anesthesia Plan  ASA: III  Anesthesia Plan: Spinal   Post-op Pain Management: GA combined w/ Regional for post-op pain   Induction: Intravenous  PONV Risk Score and Plan: 2  Airway Management Planned: Nasal Cannula  Additional Equipment:   Intra-op Plan:   Post-operative Plan:   Informed Consent: I have reviewed the patients History and Physical, chart, labs and discussed the procedure including the risks, benefits and alternatives for the proposed anesthesia with the patient or authorized representative who has indicated his/her understanding and acceptance.     Dental advisory given  Plan Discussed with: CRNA, Anesthesiologist and Surgeon  Anesthesia Plan Comments: (Discussed both nerve block for pain relief post-op and GA; including NV, sore throat, dental injury, and  pulmonary complications)        Anesthesia Quick Evaluation

## 2018-08-23 NOTE — Patient Instructions (Signed)
YOU NEED TO HAVE A COVID 19 TEST ON_______ @_______ , THIS TEST MUST BE DONE BEFORE SURGERY, COME  [place] , [id number]. ONCE YOUR COVID TEST IS COMPLETED, PLEASE BEGIN THE QUARANTINE INSTRUCTIONS AS OUTLINED IN YOUR HANDOUT.                Lori Mooney  08/23/2018   Your procedure is scheduled on: 08/24/18  Report to [facility] Main  Entrance  Report to admitting at 5:30 AM   1 VISITOR IS ALLOWED TO WAIT IN WAITING ROOM  ONLY DAY OF YOUR SURGERY.    Call this number if you have problems the morning of surgery [phone]    Remember: Do not eat food or drink liquids :After Midnight.  BRUSH YOUR TEETH MORNING OF SURGERY AND RINSE YOUR MOUTH OUT, NO CHEWING GUM CANDY OR MINTS.     Take these medicines the morning of surgery with A SIP OF WATER:  DO NOT TAKE ANY DIABETIC MEDICATIONS DAY OF YOUR SURGERY                               You may not have any metal on your body including hair pins and              piercings              Do not wear jewelry, make-up, lotions, powders or perfumes, deodorant             Do not wear nail polish.  Do not shave  48 hours prior to surgery.              Men may shave face and neck.   Do not bring valuables to the hospital. [place].  Contacts, dentures or bridgework may not be worn into surgery.      Patients discharged the day of surgery will not be allowed to drive home. IF YOU ARE HAVING SURGERY AND GOING HOME THE SAME DAY, YOU MUST HAVE AN ADULT TO DRIVE YOU HOME AND BE WITH YOU FOR 24 HOURS. YOU MAY GO HOME BY TAXI OR UBER OR ORTHERWISE, BUT AN ADULT MUST ACCOMPANY YOU HOME AND STAY WITH YOU FOR 24 HOURS.  Name and phone number of your driver:  Special Instructions: N/A              Please read over the following fact sheets you were given: _____________________________________________________________________             [facility] - Preparing  for Surgery Before surgery, you can play an important role.   Because skin is not sterile, your skin needs to be as free of germs as possible.   You can reduce the number of germs on your skin by washing with CHG (chlorahexidine gluconate) soap before surgery.   CHG is an antiseptic cleaner which kills germs and bonds with the skin to continue killing germs even after washing. Please DO NOT use if you have an allergy to CHG or antibacterial soaps.   If your skin becomes reddened/irritated stop using the CHG and inform your nurse when you arrive at Short Stay. Do not shave (including legs and underarms) for at least 48 hours prior to the first CHG shower.  YPlease follow these instructions carefully:  1.  Shower  with CHG Soap the night before surgery and the  morning of Surgery.  2.  If you choose to wash your hair, wash your hair first as usual with your  normal  shampoo.  3.  After you shampoo, rinse your hair and body thoroughly to remove the  shampoo.                                        .  Use CHG as you would any other liquid soap.  You can apply chg directly  to the skin and wash                       Gently with a scrungie or clean washcloth.  5.  Apply the CHG Soap to your body ONLY FROM THE NECK DOWN.   Do not use on face/ open                           Wound or open sores. Avoid contact with eyes, ears mouth and genitals (private parts).                       Wash face,  Genitals (private parts) with your normal soap.             6.  Wash thoroughly, paying special attention to the area where your surgery  will be performed.  7.  Thoroughly rinse your body with warm water from the neck down.  8.  DO NOT shower/wash with your normal soap after using and rinsing off  the CHG Soap.                9.  Pat yourself dry with a clean towel.            10.  Wear clean pajamas.            11.  Place clean sheets on your bed the night of your first shower and do not  sleep with pets. Day of  Surgery : Do not apply any lotions/deodorants the morning of surgery.  Please wear clean clothes to the hospital/surgery center.  FAILURE TO FOLLOW THESE INSTRUCTIONS MAY RESULT IN THE CANCELLATION OF YOUR SURGERY PATIENT SIGNATURE_________________________________  NURSE SIGNATURE__________________________________  ________________________________________________________________________  WHAT IS A BLOOD TRANSFUSION? Blood Transfusion Information  A transfusion is the replacement of blood or some of its parts. Blood is made up of multiple cells which provide different functions.  Red blood cells carry oxygen and are used for blood loss replacement.  White blood cells fight against infection.  Platelets control bleeding.  Plasma helps clot blood.  Other blood products are available for specialized needs, such as hemophilia or other clotting disorders. BEFORE THE TRANSFUSION  Who gives blood for transfusions?   Healthy volunteers who are fully evaluated to make sure their blood is safe. This is blood bank blood. Transfusion therapy is the safest it has ever been in the practice of medicine. Before blood is taken from a donor, a complete history is taken to make sure that person has no history of diseases nor engages in risky social behavior (examples are intravenous drug use or sexual activity with multiple partners). The donor's travel history is screened to minimize risk of transmitting infections, such as malaria. The donated blood is tested for signs of infectious  diseases, such as HIV and hepatitis. The blood is then tested to be sure it is compatible with you in order to minimize the chance of a transfusion reaction. If you or a relative donates blood, this is often done in anticipation of surgery and is not appropriate for emergency situations. It takes many days to process the donated blood. RISKS AND COMPLICATIONS Although transfusion therapy is very safe and saves many lives, the  main dangers of transfusion include:   Getting an infectious disease.  Developing a transfusion reaction. This is an allergic reaction to something in the blood you were given. Every precaution is taken to prevent this. The decision to have a blood transfusion has been considered carefully by your caregiver before blood is given. Blood is not given unless the benefits outweigh the risks. AFTER THE TRANSFUSION  Right after receiving a blood transfusion, you will usually feel much better and more energetic. This is especially true if your red blood cells have gotten low (anemic). The transfusion raises the level of the red blood cells which carry oxygen, and this usually causes an energy increase.  The nurse administering the transfusion will monitor you carefully for complications. HOME CARE INSTRUCTIONS  No special instructions are needed after a transfusion. You may find your energy is better. Speak with your caregiver about any limitations on activity for underlying diseases you may have. SEEK MEDICAL CARE IF:   Your condition is not improving after your transfusion.  You develop redness or irritation at the intravenous (IV) site. SEEK IMMEDIATE MEDICAL CARE IF:  Any of the following symptoms occur over the next 12 hours:  Shaking chills.  You have a temperature by mouth above 102 F (38.9 C), not controlled by medicine.  Chest, back, or muscle pain.  People around you feel you are not acting correctly or are confused.  Shortness of breath or difficulty breathing.  Dizziness and fainting.  You get a rash or develop hives.  You have a decrease in urine output.  Your urine turns a dark color or changes to pink, red, or brown. Any of the following symptoms occur over the next 10 days:  You have a temperature by mouth above 102 F (38.9 C), not controlled by medicine.  Shortness of breath.  Weakness after normal activity.  The white part of the eye turns yellow  (jaundice).  You have a decrease in the amount of urine or are urinating less often.  Your urine turns a dark color or changes to pink, red, or brown. Document Released: 01/10/2000 Document Revised: 04/06/2011 Document Reviewed: 08/29/2007 ExitCare Patient Information 2014 [place].  _______________________________________________________________________  Incentive Spirometer  An incentive spirometer is a tool that can help keep your lungs clear and active. This tool measures how well you are filling your lungs with each breath. Taking long deep breaths may help reverse or decrease the chance of developing breathing (pulmonary) problems (especially infection) following:  A long period of time when you are unable to move or be active. BEFORE THE PROCEDURE   If the spirometer includes an indicator to show your best effort, your nurse or respiratory therapist will set it to a desired goal.  If possible, sit up straight or lean slightly forward. Try not to slouch.  Hold the incentive spirometer in an upright position. INSTRUCTIONS FOR USE  1. Sit on the edge of your bed if possible, or sit up as far as you can in bed or on a chair. 2. Hold the incentive  spirometer in an upright position. 3. Breathe out normally. 4. Place the mouthpiece in your mouth and seal your lips tightly around it. 5. Breathe in slowly and as deeply as possible, raising the piston or the ball toward the top of the column. 6. Hold your breath for 3-5 seconds or for as long as possible. Allow the piston or ball to fall to the bottom of the column. 7. Remove the mouthpiece from your mouth and breathe out normally. 8. Rest for a few seconds and repeat Steps 1 through 7 at least 10 times every 1-2 hours when you are awake. Take your time and take a few normal breaths between deep breaths. 9. The spirometer may include an indicator to show your best effort. Use the indicator as a goal to work toward during each  repetition. 10. After each set of 10 deep breaths, practice coughing to be sure your lungs are clear. If you have an incision (the cut made at the time of surgery), support your incision when coughing by placing a pillow or rolled up towels firmly against it. Once you are able to get out of bed, walk around indoors and cough well. You may stop using the incentive spirometer when instructed by your caregiver.  RISKS AND COMPLICATIONS  Take your time so you do not get dizzy or light-headed.  If you are in pain, you may need to take or ask for pain medication before doing incentive spirometry. It is harder to take a deep breath if you are having pain. AFTER USE  Rest and breathe slowly and easily.  It can be helpful to keep track of a log of your progress. Your caregiver can provide you with a simple table to help with this. If you are using the spirometer at home, follow these instructions: [place] IF:   You are having difficultly using the spirometer.  You have trouble using the spirometer as often as instructed.  Your pain medication is not giving enough relief while using the spirometer.  You develop fever of 100.5 F (38.1 C) or higher. SEEK IMMEDIATE MEDICAL CARE IF:   You cough up bloody sputum that had not been present before.  You develop fever of 102 F (38.9 C) or greater.  You develop worsening pain at or near the incision site. MAKE SURE YOU:   Understand these instructions.  Will watch your condition.  Will get help right away if you are not doing well or get worse. Document Released: 05/25/2006 Document Revised: 04/06/2011 Document Reviewed: 07/26/2006 [facility] Patient Information 2014 [place], [place].   ________________________________________________________________________

## 2018-08-24 ENCOUNTER — Inpatient Hospital Stay (HOSPITAL_COMMUNITY): Payer: Medicare Other | Admitting: Certified Registered"

## 2018-08-24 ENCOUNTER — Inpatient Hospital Stay (HOSPITAL_COMMUNITY): Payer: Medicare Other | Admitting: Physician Assistant

## 2018-08-24 ENCOUNTER — Encounter (HOSPITAL_COMMUNITY)
Admission: RE | Disposition: A | Discharge status: Home / Self Care | Payer: Self-pay | Source: Other Acute Inpatient Hospital | Attending: Orthopedic Surgery

## 2018-08-24 ENCOUNTER — Observation Stay (HOSPITAL_COMMUNITY)
Admission: RE | Admit: 2018-08-24 | Discharge: 2018-08-26 | Disposition: A | Discharge status: Home / Self Care | Payer: Medicare Other | Source: Other Acute Inpatient Hospital | Attending: Orthopedic Surgery | Admitting: Orthopedic Surgery

## 2018-08-24 ENCOUNTER — Encounter (HOSPITAL_COMMUNITY): Payer: Self-pay

## 2018-08-24 ENCOUNTER — Other Ambulatory Visit: Payer: Self-pay

## 2018-08-24 DIAGNOSIS — Z96652 Presence of left artificial knee joint: Secondary | ICD-10-CM

## 2018-08-24 DIAGNOSIS — I10 Essential (primary) hypertension: Secondary | ICD-10-CM | POA: Diagnosis not present

## 2018-08-24 DIAGNOSIS — Z6835 Body mass index (BMI) 35.0-35.9, adult: Secondary | ICD-10-CM | POA: Diagnosis not present

## 2018-08-24 DIAGNOSIS — E669 Obesity, unspecified: Secondary | ICD-10-CM | POA: Diagnosis not present

## 2018-08-24 DIAGNOSIS — M1712 Unilateral primary osteoarthritis, left knee: Secondary | ICD-10-CM | POA: Diagnosis not present

## 2018-08-24 DIAGNOSIS — Z79899 Other long term (current) drug therapy: Secondary | ICD-10-CM | POA: Diagnosis not present

## 2018-08-24 DIAGNOSIS — G8918 Other acute postprocedural pain: Secondary | ICD-10-CM | POA: Diagnosis not present

## 2018-08-24 DIAGNOSIS — M24562 Contracture, left knee: Secondary | ICD-10-CM | POA: Diagnosis not present

## 2018-08-24 DIAGNOSIS — K219 Gastro-esophageal reflux disease without esophagitis: Secondary | ICD-10-CM | POA: Diagnosis not present

## 2018-08-24 DIAGNOSIS — E785 Hyperlipidemia, unspecified: Secondary | ICD-10-CM | POA: Diagnosis not present

## 2018-08-24 DIAGNOSIS — I119 Hypertensive heart disease without heart failure: Secondary | ICD-10-CM | POA: Diagnosis not present

## 2018-08-24 DIAGNOSIS — E7800 Pure hypercholesterolemia, unspecified: Secondary | ICD-10-CM | POA: Diagnosis not present

## 2018-08-24 HISTORY — PX: TOTAL KNEE ARTHROPLASTY: SHX125

## 2018-08-24 LAB — TYPE AND SCREEN
ABO/RH(D): A POS
Antibody Screen: NEGATIVE

## 2018-08-24 SURGERY — ARTHROPLASTY, KNEE, TOTAL
Anesthesia: Spinal | Laterality: Left

## 2018-08-24 MED ORDER — METOCLOPRAMIDE HCL 5 MG/ML IJ SOLN: 5.0000 mg | Freq: Three times a day (TID) | INTRAMUSCULAR | Status: DC | PRN

## 2018-08-24 MED ORDER — FENTANYL CITRATE (PF) 100 MCG/2ML IJ SOLN
INTRAMUSCULAR | Status: DC | PRN
  Administered 2018-08-24: 50 ug via INTRAVENOUS

## 2018-08-24 MED ORDER — SODIUM CHLORIDE (PF) 0.9 % IJ SOLN
INTRAMUSCULAR | Status: DC | PRN
  Administered 2018-08-24: 20 mL

## 2018-08-24 MED ORDER — PROPOFOL 10 MG/ML IV BOLUS
INTRAVENOUS | Status: AC
  Filled 2018-08-24: qty 80

## 2018-08-24 MED ORDER — BUPIVACAINE LIPOSOME 1.3 % IJ SUSP
INTRAMUSCULAR | Status: DC | PRN
  Administered 2018-08-24: 20 mL

## 2018-08-24 MED ORDER — PROPOFOL 10 MG/ML IV BOLUS
INTRAVENOUS | Status: AC
  Filled 2018-08-24: qty 20

## 2018-08-24 MED ORDER — ONDANSETRON HCL 4 MG/2ML IJ SOLN
INTRAMUSCULAR | Status: DC | PRN
  Administered 2018-08-24: 4 mg via INTRAVENOUS

## 2018-08-24 MED ORDER — ALUM & MAG HYDROXIDE-SIMETH 200-200-20 MG/5ML PO SUSP: 30.0000 mL | ORAL | Status: DC | PRN

## 2018-08-24 MED ORDER — MENTHOL 3 MG MT LOZG: 1.0000 | LOZENGE | OROMUCOSAL | Status: DC | PRN

## 2018-08-24 MED ORDER — CLONIDINE HCL (ANALGESIA) 100 MCG/ML EP SOLN
EPIDURAL | Status: DC | PRN
  Administered 2018-08-24: 100 ug

## 2018-08-24 MED ORDER — BUPIVACAINE LIPOSOME 1.3 % IJ SUSP
20.0000 mL | Freq: Once | INTRAMUSCULAR | Status: DC
  Filled 2018-08-24: qty 20

## 2018-08-24 MED ORDER — CHLORHEXIDINE GLUCONATE 4 % EX LIQD: 60.0000 mL | Freq: Once | CUTANEOUS | Status: DC

## 2018-08-24 MED ORDER — BUPIVACAINE IN DEXTROSE 0.75-8.25 % IT SOLN
INTRATHECAL | Status: DC | PRN
  Administered 2018-08-24: 1.6 mL via INTRATHECAL

## 2018-08-24 MED ORDER — PROPOFOL 500 MG/50ML IV EMUL
INTRAVENOUS | Status: DC | PRN
  Administered 2018-08-24: 135 ug/kg/min via INTRAVENOUS

## 2018-08-24 MED ORDER — DEXAMETHASONE SODIUM PHOSPHATE 10 MG/ML IJ SOLN
INTRAMUSCULAR | Status: DC | PRN
  Administered 2018-08-24: 8 mg via INTRAVENOUS

## 2018-08-24 MED ORDER — ONDANSETRON HCL 4 MG/2ML IJ SOLN: 4.0000 mg | Freq: Four times a day (QID) | INTRAMUSCULAR | Status: DC | PRN

## 2018-08-24 MED ORDER — ONDANSETRON HCL 4 MG PO TABS
4.0000 mg | ORAL_TABLET | Freq: Four times a day (QID) | ORAL | Status: DC | PRN
  Administered 2018-08-24: 4 mg via ORAL
  Filled 2018-08-24: qty 1

## 2018-08-24 MED ORDER — METHOCARBAMOL 500 MG IVPB - SIMPLE MED
500.0000 mg | Freq: Four times a day (QID) | INTRAVENOUS | Status: DC | PRN
  Filled 2018-08-24: qty 50

## 2018-08-24 MED ORDER — ACETAMINOPHEN 160 MG/5ML PO SOLN: 325.0000 mg | ORAL | Status: DC | PRN

## 2018-08-24 MED ORDER — HYDROMORPHONE HCL 1 MG/ML IJ SOLN: 0.5000 mg | INTRAMUSCULAR | Status: DC | PRN

## 2018-08-24 MED ORDER — CEFAZOLIN SODIUM-DEXTROSE 1-4 GM/50ML-% IV SOLN
1.0000 g | Freq: Four times a day (QID) | INTRAVENOUS | Status: AC
  Administered 2018-08-24 (×2): 1 g via INTRAVENOUS
  Filled 2018-08-24 (×2): qty 50

## 2018-08-24 MED ORDER — BISACODYL 5 MG PO TBEC: 5.0000 mg | DELAYED_RELEASE_TABLET | Freq: Every day | ORAL | Status: DC | PRN

## 2018-08-24 MED ORDER — ONDANSETRON HCL 4 MG/2ML IJ SOLN: 4.0000 mg | Freq: Once | INTRAMUSCULAR | Status: DC | PRN

## 2018-08-24 MED ORDER — DEXAMETHASONE SODIUM PHOSPHATE 10 MG/ML IJ SOLN
INTRAMUSCULAR | Status: AC
  Filled 2018-08-24: qty 1

## 2018-08-24 MED ORDER — DOCUSATE SODIUM 100 MG PO CAPS
100.0000 mg | ORAL_CAPSULE | Freq: Two times a day (BID) | ORAL | Status: DC
  Administered 2018-08-24 – 2018-08-26 (×4): 100 mg via ORAL
  Filled 2018-08-24 (×5): qty 1

## 2018-08-24 MED ORDER — MIDAZOLAM HCL 2 MG/2ML IJ SOLN
INTRAMUSCULAR | Status: AC
  Filled 2018-08-24: qty 2

## 2018-08-24 MED ORDER — PROPOFOL 10 MG/ML IV BOLUS
INTRAVENOUS | Status: DC | PRN
  Administered 2018-08-24: 30 mg via INTRAVENOUS

## 2018-08-24 MED ORDER — SODIUM CHLORIDE 0.9 % IV SOLN
INTRAVENOUS | Status: AC
  Filled 2018-08-24: qty 500000

## 2018-08-24 MED ORDER — SODIUM CHLORIDE 0.9 % IV SOLN
INTRAVENOUS | Status: DC | PRN
  Administered 2018-08-24: 08:00:00 10 ug/min via INTRAVENOUS

## 2018-08-24 MED ORDER — OXYCODONE HCL 5 MG PO TABS
10.0000 mg | ORAL_TABLET | ORAL | Status: DC | PRN
  Administered 2018-08-24 (×2): 15 mg via ORAL
  Administered 2018-08-24: 10 mg via ORAL
  Administered 2018-08-25 – 2018-08-26 (×3): 15 mg via ORAL
  Filled 2018-08-24 (×3): qty 3
  Filled 2018-08-24: qty 2
  Filled 2018-08-24 (×2): qty 3

## 2018-08-24 MED ORDER — PHENYLEPHRINE 40 MCG/ML (10ML) SYRINGE FOR IV PUSH (FOR BLOOD PRESSURE SUPPORT)
PREFILLED_SYRINGE | INTRAVENOUS | Status: DC | PRN
  Administered 2018-08-24: 160 ug via INTRAVENOUS
  Administered 2018-08-24 (×2): 120 ug via INTRAVENOUS

## 2018-08-24 MED ORDER — MIDAZOLAM HCL 2 MG/2ML IJ SOLN
INTRAMUSCULAR | Status: DC | PRN
Start: 2018-08-24 — End: 2018-08-24
  Administered 2018-08-24 (×2): 1 mg via INTRAVENOUS

## 2018-08-24 MED ORDER — OXYCODONE HCL 5 MG PO TABS: 5.0000 mg | ORAL_TABLET | Freq: Once | ORAL | Status: DC | PRN

## 2018-08-24 MED ORDER — ACETAMINOPHEN 325 MG PO TABS: 325.0000 mg | ORAL_TABLET | ORAL | Status: DC | PRN

## 2018-08-24 MED ORDER — POLYETHYLENE GLYCOL 3350 17 G PO PACK: 17.0000 g | PACK | Freq: Every day | ORAL | Status: DC | PRN

## 2018-08-24 MED ORDER — POVIDONE-IODINE 10 % EX SWAB
2.0000 "application " | Freq: Once | CUTANEOUS | Status: AC
  Administered 2018-08-24: 2 via TOPICAL

## 2018-08-24 MED ORDER — FLEET ENEMA 7-19 GM/118ML RE ENEM: 1.0000 | ENEMA | Freq: Once | RECTAL | Status: DC | PRN

## 2018-08-24 MED ORDER — CEFAZOLIN SODIUM-DEXTROSE 2-4 GM/100ML-% IV SOLN
2.0000 g | INTRAVENOUS | Status: AC
  Administered 2018-08-24: 08:00:00 2 g via INTRAVENOUS
  Filled 2018-08-24: qty 100

## 2018-08-24 MED ORDER — ROPIVACAINE HCL 7.5 MG/ML IJ SOLN
INTRAMUSCULAR | Status: DC | PRN
  Administered 2018-08-24: 25 mL via PERINEURAL

## 2018-08-24 MED ORDER — OXYCODONE HCL 5 MG PO TABS
5.0000 mg | ORAL_TABLET | ORAL | Status: DC | PRN
  Administered 2018-08-25 – 2018-08-26 (×3): 10 mg via ORAL
  Filled 2018-08-24 (×4): qty 2

## 2018-08-24 MED ORDER — SODIUM CHLORIDE 0.9 % IV SOLN
INTRAVENOUS | Status: DC | PRN
  Administered 2018-08-24: 500 mL

## 2018-08-24 MED ORDER — METHOCARBAMOL 500 MG PO TABS
500.0000 mg | ORAL_TABLET | Freq: Four times a day (QID) | ORAL | Status: DC | PRN
  Administered 2018-08-24 – 2018-08-26 (×3): 500 mg via ORAL
  Filled 2018-08-24 (×3): qty 1

## 2018-08-24 MED ORDER — PHENOL 1.4 % MT LIQD
1.0000 | OROMUCOSAL | Status: DC | PRN
  Filled 2018-08-24: qty 177

## 2018-08-24 MED ORDER — MEPERIDINE HCL 50 MG/ML IJ SOLN: 6.2500 mg | INTRAMUSCULAR | Status: DC | PRN

## 2018-08-24 MED ORDER — LACTATED RINGERS IV SOLN
INTRAVENOUS | Status: DC
  Administered 2018-08-24: 07:00:00 via INTRAVENOUS

## 2018-08-24 MED ORDER — FERROUS SULFATE 325 (65 FE) MG PO TABS
325.0000 mg | ORAL_TABLET | Freq: Three times a day (TID) | ORAL | Status: DC
  Administered 2018-08-25 – 2018-08-26 (×4): 325 mg via ORAL
  Filled 2018-08-24 (×5): qty 1

## 2018-08-24 MED ORDER — RIVAROXABAN 10 MG PO TABS
10.0000 mg | ORAL_TABLET | Freq: Every day | ORAL | Status: DC
  Administered 2018-08-25 – 2018-08-26 (×2): 10 mg via ORAL
  Filled 2018-08-24 (×2): qty 1

## 2018-08-24 MED ORDER — ONDANSETRON HCL 4 MG/2ML IJ SOLN
INTRAMUSCULAR | Status: AC
  Filled 2018-08-24: qty 2

## 2018-08-24 MED ORDER — FENTANYL CITRATE (PF) 100 MCG/2ML IJ SOLN
INTRAMUSCULAR | Status: AC
  Filled 2018-08-24: qty 2

## 2018-08-24 MED ORDER — ACETAMINOPHEN 325 MG PO TABS
325.0000 mg | ORAL_TABLET | Freq: Four times a day (QID) | ORAL | Status: DC | PRN
  Administered 2018-08-25: 650 mg via ORAL
  Filled 2018-08-24 (×2): qty 2

## 2018-08-24 MED ORDER — LACTATED RINGERS IV SOLN
INTRAVENOUS | Status: DC
  Administered 2018-08-24 – 2018-08-25 (×4): via INTRAVENOUS

## 2018-08-24 MED ORDER — FENTANYL CITRATE (PF) 100 MCG/2ML IJ SOLN: 25.0000 ug | INTRAMUSCULAR | Status: DC | PRN

## 2018-08-24 MED ORDER — ACETAMINOPHEN 500 MG PO TABS
1000.0000 mg | ORAL_TABLET | Freq: Four times a day (QID) | ORAL | Status: AC
  Administered 2018-08-24 – 2018-08-25 (×4): 1000 mg via ORAL
  Filled 2018-08-24 (×4): qty 2

## 2018-08-24 MED ORDER — SODIUM CHLORIDE (PF) 0.9 % IJ SOLN
INTRAMUSCULAR | Status: AC
  Filled 2018-08-24: qty 20

## 2018-08-24 MED ORDER — METOCLOPRAMIDE HCL 5 MG PO TABS: 5.0000 mg | ORAL_TABLET | Freq: Three times a day (TID) | ORAL | Status: DC | PRN

## 2018-08-24 MED ORDER — PHENYLEPHRINE HCL (PRESSORS) 10 MG/ML IV SOLN
INTRAVENOUS | Status: AC
  Filled 2018-08-24: qty 1

## 2018-08-24 MED ORDER — OXYCODONE HCL 5 MG/5ML PO SOLN: 5.0000 mg | Freq: Once | ORAL | Status: DC | PRN

## 2018-08-24 SURGICAL SUPPLY — 76 items
AGENT HMST SPONGE THK3/8 (HEMOSTASIS) ×1
BAG DECANTER FOR FLEXI CONT (MISCELLANEOUS) ×3 IMPLANT
BAG SPEC THK2 15X12 ZIP CLS (MISCELLANEOUS)
BAG ZIPLOCK 12X15 (MISCELLANEOUS) IMPLANT
BLADE SAG 18X100X1.27 (BLADE) ×3 IMPLANT
BLADE SAW SGTL 11.0X1.19X90.0M (BLADE) ×3 IMPLANT
BLADE SURG SZ10 CARB STEEL (BLADE) ×6 IMPLANT
BNDG ELASTIC 4X5.8 VLCR STR LF (GAUZE/BANDAGES/DRESSINGS) ×3 IMPLANT
BNDG ELASTIC 6X5.8 VLCR STR LF (GAUZE/BANDAGES/DRESSINGS) ×3 IMPLANT
BNDG GAUZE ELAST 4 BULKY (GAUZE/BANDAGES/DRESSINGS) ×4 IMPLANT
CEMENT HV SMART SET (Cement) ×6 IMPLANT
CEMENT TIBIA MBT SIZE 2.5 (Knees) IMPLANT
CLOSURE STERI-STRIP 1/2X4 (GAUZE/BANDAGES/DRESSINGS) ×2
CLOSURE WOUND 1/2 X4 (GAUZE/BANDAGES/DRESSINGS) ×2
CLSR STERI-STRIP ANTIMIC 1/2X4 (GAUZE/BANDAGES/DRESSINGS) ×2 IMPLANT
COVER SURGICAL LIGHT HANDLE (MISCELLANEOUS) ×3 IMPLANT
COVER WAND RF STERILE (DRAPES) IMPLANT
CUFF TOURN SGL QUICK 34 (TOURNIQUET CUFF) ×3
CUFF TRNQT CYL 34X4.125X (TOURNIQUET CUFF) ×1 IMPLANT
DECANTER SPIKE VIAL GLASS SM (MISCELLANEOUS) ×3 IMPLANT
DRAPE INCISE IOBAN 66X45 STRL (DRAPES) ×2 IMPLANT
DRAPE U-SHAPE 47X51 STRL (DRAPES) ×3 IMPLANT
DRSG ADAPTIC 3X8 NADH LF (GAUZE/BANDAGES/DRESSINGS) ×3 IMPLANT
DRSG PAD ABDOMINAL 8X10 ST (GAUZE/BANDAGES/DRESSINGS) ×6 IMPLANT
DURAPREP 26ML APPLICATOR (WOUND CARE) ×3 IMPLANT
ELECT BLADE TIP CTD 4 INCH (ELECTRODE) ×3 IMPLANT
ELECT REM PT RETURN 15FT ADLT (MISCELLANEOUS) ×3 IMPLANT
EVACUATOR 1/8 PVC DRAIN (DRAIN) ×3 IMPLANT
FACESHIELD WRAPAROUND (MASK) ×9 IMPLANT
FACESHIELD WRAPAROUND OR TEAM (MASK) ×1 IMPLANT
GAUZE SPONGE 4X4 12PLY STRL (GAUZE/BANDAGES/DRESSINGS) ×3 IMPLANT
GLOVE BIOGEL PI IND STRL 6.5 (GLOVE) ×1 IMPLANT
GLOVE BIOGEL PI IND STRL 8.5 (GLOVE) ×1 IMPLANT
GLOVE BIOGEL PI INDICATOR 6.5 (GLOVE) ×2
GLOVE BIOGEL PI INDICATOR 8.5 (GLOVE) ×2
GLOVE ECLIPSE 8.0 STRL XLNG CF (GLOVE) ×6 IMPLANT
GLOVE SURG SS PI 6.5 STRL IVOR (GLOVE) ×3 IMPLANT
GOWN STRL REUS W/TWL LRG LVL3 (GOWN DISPOSABLE) ×3 IMPLANT
GOWN STRL REUS W/TWL XL LVL3 (GOWN DISPOSABLE) ×3 IMPLANT
HANDPIECE INTERPULSE COAX TIP (DISPOSABLE) ×3
HEMOSTAT SPONGE AVITENE ULTRA (HEMOSTASIS) ×3 IMPLANT
HOLDER FOLEY CATH W/STRAP (MISCELLANEOUS) IMPLANT
IMMOBILIZER KNEE 20 (SOFTGOODS) ×3
IMMOBILIZER KNEE 20 THIGH 36 (SOFTGOODS) ×1 IMPLANT
IMPL FEMUR SIGMA LT PS SZ 3 (Knees) IMPLANT
IMPLANT FEMUR SIGMA LT PS SZ 3 (Knees) ×3 IMPLANT
INSERT TIBIAL PFC 3 17.5 (Knees) ×2 IMPLANT
KIT TURNOVER KIT A (KITS) IMPLANT
MANIFOLD NEPTUNE II (INSTRUMENTS) ×3 IMPLANT
NS IRRIG 1000ML POUR BTL (IV SOLUTION) IMPLANT
PACK TOTAL KNEE CUSTOM (KITS) ×3 IMPLANT
PACK UNIVERSAL I (CUSTOM PROCEDURE TRAY) ×2 IMPLANT
PADDING CAST COTTON 6X4 STRL (CAST SUPPLIES) ×3 IMPLANT
PATELLA DOME PFC 35MM (Knees) ×2 IMPLANT
PENCIL SMOKE EVAC W/HOLSTER (ELECTROSURGICAL) ×3 IMPLANT
PIN STEINMAN FIXATION KNEE (PIN) ×2 IMPLANT
PROTECTOR NERVE ULNAR (MISCELLANEOUS) ×3 IMPLANT
SET HNDPC FAN SPRY TIP SCT (DISPOSABLE) ×1 IMPLANT
SET PAD KNEE POSITIONER (MISCELLANEOUS) ×3 IMPLANT
SPONGE LAP 18X18 RF (DISPOSABLE) IMPLANT
STRIP CLOSURE SKIN 1/2X4 (GAUZE/BANDAGES/DRESSINGS) ×4 IMPLANT
SUT BONE WAX W31G (SUTURE) IMPLANT
SUT MNCRL AB 4-0 PS2 18 (SUTURE) ×3 IMPLANT
SUT STRATAFIX 0 PDS 27 VIOLET (SUTURE) ×3
SUT VIC AB 1 CT1 27 (SUTURE) ×3
SUT VIC AB 1 CT1 27XBRD ANTBC (SUTURE) ×1 IMPLANT
SUT VIC AB 2-0 CT1 27 (SUTURE) ×9
SUT VIC AB 2-0 CT1 TAPERPNT 27 (SUTURE) ×3 IMPLANT
SUTURE STRATFX 0 PDS 27 VIOLET (SUTURE) ×1 IMPLANT
SYR 20ML LL LF (SYRINGE) ×6 IMPLANT
TIBIA MBT CEMENT SIZE 2.5 (Knees) ×3 IMPLANT
TOWER CARTRIDGE SMART MIX (DISPOSABLE) ×3 IMPLANT
TRAY FOLEY MTR SLVR 14FR STAT (SET/KITS/TRAYS/PACK) ×2 IMPLANT
WATER STERILE IRR 1000ML POUR (IV SOLUTION) ×5 IMPLANT
WRAP KNEE MAXI GEL POST OP (GAUZE/BANDAGES/DRESSINGS) ×3 IMPLANT
YANKAUER SUCT BULB TIP 10FT TU (MISCELLANEOUS) ×3 IMPLANT

## 2018-08-24 NOTE — Brief Op Note (Signed)
08/24/2018  9:26 AM  PATIENT:  Lori Mooney  73 y.o. female  PRE-OPERATIVE DIAGNOSIS:  left kneePrimary osteoarthritis with Flexion Contractures  POST-OPERATIVE DIAGNOSIS: Same as Pre-Op  PROCEDURE:  Procedure(s) with comments: TOTAL KNEE ARTHROPLASTY (Left) - 163min  SURGEON:  Surgeon(s) and Role:    [name], MD - Primary  PHYSICIAN ASSISTANT: Ardeen Jourdain PA  ASSISTANTS: Ardeen Jourdain PA   ANESTHESIA:   spinal,Adductor Nerve Block  EBL:  none   BLOOD ADMINISTERED:none  DRAINS: (Left Knee) Hemovact drain(s) in the Open with  Suction Open   LOCAL MEDICATIONS USED:  OTHER 20cc of Exparel mixed with 20cc of Normal Saline  SPECIMEN:  No Specimen  DISPOSITION OF SPECIMEN:  PATHOLOGY  COUNTS:  YES  TOURNIQUET:  * Missing tourniquet times found for documented tourniquets in log: [id number] *  DICTATION: .Other Dictation: Dictation Number [id number]  PLAN OF CARE: Admit to inpatient   PATIENT DISPOSITION:  Stable in OR   Delay start of Pharmacological VTE agent (>24hrs) due to surgical blood loss or risk of bleeding: yes

## 2018-08-24 NOTE — Discharge Instructions (Addendum)
Dr. [name], MD [facility] [address] [place], [place] [address] [phone]  TOTAL KNEE REPLACEMENT POSTOPERATIVE DIRECTIONS  Knee Rehabilitation, Guidelines Following Surgery  Results after knee surgery are often greatly improved when you follow the exercise, range of motion and muscle strengthening exercises prescribed by your doctor. Safety measures are also important to protect the knee from further injury. Any time any of these exercises cause you to have increased pain or swelling in your knee joint, decrease the amount until you are comfortable again and slowly increase them. If you have problems or questions, call your caregiver or physical therapist for advice.   HOME CARE INSTRUCTIONS  Remove items at home which could result in a fall. This includes throw rugs or furniture in walking pathways.   ICE to the affected knee every three hours for 30 minutes at a time and then as needed for pain and swelling.  Continue to use ice on the knee for pain and swelling from surgery. You may notice swelling that will progress down to the foot and ankle.  This is normal after surgery.  Elevate the leg when you are not up walking on it.    Continue to use the breathing machine which will help keep your temperature down.  It is common for your temperature to cycle up and down following surgery, especially at night when you are not up moving around and exerting yourself.  The breathing machine keeps your lungs expanded and your temperature down.  Do not place pillow under knee, focus on keeping the knee straight while resting  DIET You may resume your previous home diet once your are discharged from the hospital.  DRESSING / WOUND CARE / SHOWERING You may shower 3 days after surgery, but keep the wounds dry during showering.  You may use an occlusive plastic wrap (Press'n Seal for example), NO SOAKING/SUBMERGING IN THE BATHTUB.  If the bandage gets wet, change with a  clean dry gauze.  If the incision gets wet, pat the wound dry with a clean towel. You may start showering once you are discharged home but do not submerge the incision under water. Just pat the incision dry and apply a dry gauze dressing on daily. Change the surgical dressing daily and reapply a dry dressing each time.  ACTIVITY Walk with your walker as instructed. Use walker as long as suggested by your caregivers. Avoid periods of inactivity such as sitting longer than an hour when not asleep. This helps prevent blood clots.  You may resume a sexual relationship in one month or when given the OK by your doctor.  You may return to work once you are cleared by your doctor.  Do not drive a car for 6 weeks or until released by you surgeon.  Do not drive while taking narcotics.  WEIGHT BEARING Weight bearing as tolerated with assist device (walker, cane, etc) as directed, use it as long as suggested by your surgeon or therapist, typically at least 4-6 weeks.  POSTOPERATIVE CONSTIPATION PROTOCOL Constipation - defined medically as fewer than three stools per week and severe constipation as less than one stool per week.  One of the most common issues patients have following surgery is constipation.  Even if you have a regular bowel pattern at home, your normal regimen is likely to be disrupted due to multiple reasons following surgery.  Combination of anesthesia, postoperative narcotics, change in appetite and fluid intake all can affect your bowels.  In order  to avoid complications following surgery, here are some recommendations in order to help you during your recovery period.  Colace (docusate) - Pick up an over-the-counter form of Colace or another stool softener and take twice a day as long as you are requiring postoperative pain medications.  Take with a full glass of water daily.  If you experience loose stools or diarrhea, hold the colace until you stool forms back up.  If your symptoms do  not get better within 1 week or if they get worse, check with your doctor.  Dulcolax (bisacodyl) - Pick up over-the-counter and take as directed by the product packaging as needed to assist with the movement of your bowels.  Take with a full glass of water.  Use this product as needed if not relieved by Colace only.   MiraLax (polyethylene glycol) - Pick up over-the-counter to have on hand.  MiraLax is a solution that will increase the amount of water in your bowels to assist with bowel movements.  Take as directed and can mix with a glass of water, juice, soda, coffee, or tea.  Take if you go more than two days without a movement. Do not use MiraLax more than once per day. Call your doctor if you are still constipated or irregular after using this medication for 7 days in a row.  If you continue to have problems with postoperative constipation, please contact the office for further assistance and recommendations.  If you experience "the worst abdominal pain ever" or develop nausea or vomiting, please contact the office immediatly for further recommendations for treatment.  ITCHING  If you experience itching with your medications, try taking only a single pain pill, or even half a pain pill at a time.  You can also use Benadryl over the counter for itching or also to help with sleep.   TED HOSE STOCKINGS Wear the elastic stockings on both legs for three weeks following surgery during the day but you may remove then at night for sleeping.  MEDICATIONS See your medication summary on the After Visit Summary that the nursing staff will review with you prior to discharge.  You may have some home medications which will be placed on hold until you complete the course of blood thinner medication.  It is important for you to complete the blood thinner medication as prescribed by your surgeon.  Continue your approved medications as instructed at time of discharge.  PRECAUTIONS If you experience chest pain  or shortness of breath - call 911 immediately for transfer to the hospital emergency department.  If you develop a fever greater that 101 F, purulent drainage from wound, increased redness or drainage from wound, foul odor from the wound/dressing, or calf pain - CONTACT YOUR SURGEON.                                                   FOLLOW-UP APPOINTMENTS Make sure you keep all of your appointments after your operation with your surgeon and caregivers. You should call the office at the above phone number and make an appointment for approximately two weeks after the date of your surgery or on the date instructed by your surgeon outlined in the "After Visit Summary".   RANGE OF MOTION AND STRENGTHENING EXERCISES  Rehabilitation of the knee is important following a knee injury or an operation.  After just a few days of immobilization, the muscles of the thigh which control the knee become weakened and shrink (atrophy). Knee exercises are designed to build up the tone and strength of the thigh muscles and to improve knee motion. Often times heat used for twenty to thirty minutes before working out will loosen up your tissues and help with improving the range of motion but do not use heat for the first two weeks following surgery. These exercises can be done on a training (exercise) mat, on the floor, on a table or on a bed. Use what ever works the best and is most comfortable for you Knee exercises include:  Leg Lifts - While your knee is still immobilized in a splint or cast, you can do straight leg raises. Lift the leg to 60 degrees, hold for 3 sec, and slowly lower the leg. Repeat 10-20 times 2-3 times daily. Perform this exercise against resistance later as your knee gets better.  Quad and Hamstring Sets - Tighten up the muscle on the front of the thigh (Quad) and hold for 5-10 sec. Repeat this 10-20 times hourly. Hamstring sets are done by pushing the foot backward against an object and holding for 5-10  sec. Repeat as with quad sets.   Leg Slides: Lying on your back, slowly slide your foot toward your buttocks, bending your knee up off the floor (only go as far as is comfortable). Then slowly slide your foot back down until your leg is flat on the floor again.  Angel Wings: Lying on your back spread your legs to the side as far apart as you can without causing discomfort.  A rehabilitation program following serious knee injuries can speed recovery and prevent re-injury in the future due to weakened muscles. Contact your doctor or a physical therapist for more information on knee rehabilitation.   IF YOU ARE TRANSFERRED TO A SKILLED REHAB FACILITY If the patient is transferred to a skilled rehab facility following release from the hospital, a list of the current medications will be sent to the facility for the patient to continue.  When discharged from the skilled rehab facility, please have the facility set up the patient's [place] prior to being released. Also, the skilled facility will be responsible for providing the patient with their medications at time of release from the facility to include their pain medication, the muscle relaxants, and their blood thinner medication. If the patient is still at the rehab facility at time of the two week follow up appointment, the skilled rehab facility will also need to assist the patient in arranging follow up appointment in our office and any transportation needs.  MAKE SURE YOU:  Understand these instructions.  Get help right away if you are not doing well or get worse.    Pick up stool softner and laxative for home use following surgery while on pain medications. Do not submerge incision under water. Please use good hand washing techniques while changing dressing each day. May shower starting three days after surgery. Please use a clean towel to pat the incision dry following showers. Continue to use ice for pain and swelling  after surgery. Do not use any lotions or creams on the incision until instructed by your surgeon.

## 2018-08-24 NOTE — Interval H&P Note (Signed)
History and Physical Interval Note:  08/24/2018 7:15 AM  Lori Mooney  has presented today for surgery, with the diagnosis of left knee osteoarthritis.  The various methods of treatment have been discussed with the patient and family. After consideration of risks, benefits and other options for treatment, the patient has consented to  Procedure(s) with comments: TOTAL KNEE ARTHROPLASTY (Left) - 186min as a surgical intervention.  The patient's history has been reviewed, patient examined, no change in status, stable for surgery.  I have reviewed the patient's chart and labs.  Questions were answered to the patient's satisfaction.     [name]

## 2018-08-24 NOTE — Evaluation (Signed)
Physical Therapy Evaluation Patient Details Name: Lori Mooney MRN: [id number] DOB: 04/29/1945 Today's Date: 08/24/2018   History of Present Illness  Lori Mooney, 73 y.o. female POD 0 for Lt TKA.  Clinical Impression  Lori Mooney is 73 y.o. female POD 0 for Lt TKA. She is independent at baseline with no device for mobility. Patient reports 4/10 pain at start of session and denied any increase following mobility. Patient was able to perform bed mobility with supervision and no physical assistance needed. She performed sit to stand with min assist and RW, min cues for hand placement. Patient able to ambulate with RW for ~ 30 feet and cues to remain close to walker. She was educated on exercises for ROM and edema management and left with LE's elevated in recliner and ice applied. She will benefit from follow up therapy to progress mobility and ROM and is planning to begin OPPT services next week. She will benefit from additional skilled acute PT sessions to progress gait and stair mobility in preparation for safe discharge home.     Follow Up Recommendations Follow surgeon's recommendation for DC plan and follow-up therapies    Equipment Recommendations  None recommended by PT    Recommendations for Other Services       Precautions / Restrictions Precautions Precautions: Fall Restrictions Weight Bearing Restrictions: Yes RLE Weight Bearing: Weight bearing as tolerated LLE Weight Bearing: Weight bearing as tolerated      Mobility  Bed Mobility Overal bed mobility: Needs Assistance Bed Mobility: Supine to Sit     Supine to sit: Supervision     General bed mobility comments: patient required min cuing to roll to Rt side and push up from bed with bed rail, performed from flat bed  Transfers Overall transfer level: Needs assistance Equipment used: Rolling walker (2 wheeled) Transfers: Sit to/from Stand Sit to Stand: Min assist             Ambulation/Gait Ambulation/Gait assistance: Min Web designer (Feet): 30 Feet Assistive device: Rolling walker (2 wheeled) Gait Pattern/deviations: Step-through pattern        Stairs            Wheelchair Mobility    Modified Rankin (Stroke Patients Only)       Balance Overall balance assessment: Needs assistance Sitting-balance support: Feet supported;No upper extremity supported Sitting balance-Leahy Scale: Good     Standing balance support: No upper extremity supported;Bilateral upper extremity supported;During functional activity Standing balance-Leahy Scale: Fair Standing balance comment: pt able to maintian standing balance with no UE support to don face mask but unable to perform dynamic standing and gait safely without UE support                             Pertinent Vitals/Pain Pain Assessment: 0-10 Pain Score: 4  Pain Location: Lt knee Pain Descriptors / Indicators: Aching;Sore Pain Intervention(s): Limited activity within patient's tolerance;Monitored during session;Ice applied    Home Living Family/patient expects to be discharged to:: Private residence Living Arrangements: Spouse/significant other(daughter can also help but lives in [place]) Available Help at Discharge: Family Type of Home: House Home Access: Stairs to enter Entrance Stairs-Rails: Right Entrance Stairs-Number of Steps: 1 step to porch and threshold into house at front door (no rails), 3 steps from garage with [place]: One level Home Equipment: Environmental consultant - 2 wheels;Cane - single point;Shower seat - built in;Toilet riser;Grab bars -  toilet      Prior Function Level of Independence: Independent         Comments: pt is independent at baseline and 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, occupational for [employer] at [employer], she is a retired Nutritional therapist   Dominant Hand: Right    Extremity/Trunk Assessment   Upper Extremity Assessment Upper  Extremity Assessment: Overall WFL for tasks assessed    Lower Extremity Assessment Lower Extremity Assessment: Generalized weakness;LLE deficits/detail LLE Deficits / Details: wrapped in ace wrap, pt able to move knee some in wrap and can perform ankle pumps    Cervical / Trunk Assessment Cervical / Trunk Assessment: Normal  Communication   Communication: No difficulties  Cognition Arousal/Alertness: Awake/alert Behavior During Therapy: WFL for tasks assessed/performed Overall Cognitive Status: Within Functional Limits for tasks assessed                     General Comments      Exercises Total Joint Exercises Ankle Circles/Pumps: AROM;10 reps;Seated;Both Quad Sets: AROM;Left;5 reps;Seated(leg elevated in recliner)   Assessment/Plan    PT Assessment Patient needs continued PT services  PT Problem List Decreased strength;Decreased activity tolerance;Decreased mobility;Decreased range of motion;Pain;Decreased balance       PT Treatment Interventions DME instruction;Stair training;Therapeutic activities;Gait training;Functional mobility training;Therapeutic exercise;Balance training;Modalities;Patient/family education    PT Goals (Current goals can be found in the Care Plan section)  Acute Rehab PT Goals Patient Stated Goal: pt wants to be able to get in/out of bed safely and to go up and down stairs safely PT Goal Formulation: With patient Time For Goal Achievement: 08/28/18 Potential to Achieve Goals: Good    Frequency 7X/week    AM-PAC PT "6 Clicks" Mobility  Outcome Measure Help needed turning from your back to your side while in a flat bed without using bedrails?: A Little Help needed moving from lying on your back to sitting on the side of a flat bed without using bedrails?: A Little Help needed moving to and from a bed to a chair (including a wheelchair)?: A Little Help needed standing up from a chair using your arms (e.g., wheelchair or bedside chair)?: A  Little Help needed to walk in hospital room?: A Little Help needed climbing 3-5 steps with a railing? : A Lot 6 Click Score: 17    End of Session Equipment Utilized During Treatment: Gait belt Activity Tolerance: Patient tolerated treatment well Patient left: in chair;with call bell/phone within reach;with chair alarm set Nurse Communication: Mobility status PT Visit Diagnosis: Unsteadiness on feet (R26.81);Other abnormalities of gait and mobility (R26.89);Muscle weakness (generalized) (M62.81);Difficulty in walking, not elsewhere classified (R26.2);Pain Pain - Right/Left: Left Pain - part of body: Knee    Time: 1459-1540 PT Time Calculation (min) (ACUTE ONLY): 41 min   Charges:   PT Evaluation $PT Eval Low Complexity: 1 Low PT Treatments $Gait Training: 8-22 mins $Therapeutic Exercise: 8-22 mins        Kipp Brood, PT, DPT, [facility] Physical Therapist with [facility]  08/24/2018 4:12 PM

## 2018-08-24 NOTE — Progress Notes (Deleted)
S/p left total knee POD 0.

## 2018-08-24 NOTE — Transfer of Care (Signed)
Immediate Anesthesia Transfer of Care Note  Patient: Lori Mooney  Procedure(s) Performed: TOTAL KNEE ARTHROPLASTY (Left )  Patient Location: PACU  Anesthesia Type:Spinal  Level of Consciousness: awake, alert  and oriented  Airway & Oxygen Therapy: Patient Spontanous Breathing and Patient connected to face mask oxygen  Post-op Assessment: Report given to RN and Post -op Vital signs reviewed and stable  Post vital signs: Reviewed and stable  Last Vitals:  Vitals Value Taken Time  BP    Temp    Pulse 86 08/24/18 0957  Resp 22 08/24/18 0957  SpO2 98 % 08/24/18 0957  Vitals shown include unvalidated device data.  Last Pain:  Vitals:   08/24/18 0557  TempSrc: Oral  PainSc:          Complications: No apparent anesthesia complications

## 2018-08-24 NOTE — Plan of Care (Signed)
  Problem: Acute Rehab PT Goals(only PT should resolve) Goal: Pt Will Go Supine/Side To Sit Outcome: Progressing Flowsheets (Taken 08/24/2018 1613) Pt will go Supine/Side to Sit: Independently Goal: Patient Will Transfer Sit To/From Stand Outcome: Progressing Flowsheets (Taken 08/24/2018 1613) Patient will transfer sit to/from stand: with supervision Goal: Pt Will Transfer Bed To Chair/Chair To Bed Outcome: Progressing Flowsheets (Taken 08/24/2018 1613) Pt will Transfer Bed to Chair/Chair to Bed: with supervision Goal: Pt Will Ambulate Outcome: Progressing Flowsheets (Taken 08/24/2018 1613) Pt will Ambulate:  > 125 feet  with supervision  with least restrictive assistive device Goal: Pt Will Go Up/Down Stairs Outcome: Progressing Flowsheets (Taken 08/24/2018 1613) Pt will Go Up / Down Stairs:  3-5 stairs  with supervision  with rail(s) Note: Rt hand rail to ascend

## 2018-08-24 NOTE — Anesthesia Postprocedure Evaluation (Signed)
Anesthesia Post Note  Patient: Lori Mooney  Procedure(s) Performed: TOTAL KNEE ARTHROPLASTY (Left )     Patient location during evaluation: PACU Anesthesia Type: Spinal Level of consciousness: oriented and awake and alert Pain management: pain level controlled Vital Signs Assessment: post-procedure vital signs reviewed and stable Respiratory status: spontaneous breathing, respiratory function stable and patient connected to nasal cannula oxygen Cardiovascular status: blood pressure returned to baseline and stable Postop Assessment: no headache, no backache and no apparent nausea or vomiting Anesthetic complications: no    Last Vitals:  Vitals:   08/24/18 1137 08/24/18 1145  BP: (!) 90/54 (!) 95/56  Pulse: 77 73  Resp: (!) 27 18  Temp:  36.4 C  SpO2: 100% 100%    Last Pain:  Vitals:   08/24/18 1145  TempSrc:   PainSc: 0-No pain                 Keosha Rossa

## 2018-08-24 NOTE — Anesthesia Procedure Notes (Signed)
Procedure Name: MAC Date/Time: 08/24/2018 7:36 AM Performed by: Niel Hummer, CRNA Pre-anesthesia Checklist: Patient identified, Emergency Drugs available, Suction available and Patient being monitored Patient Re-evaluated:Patient Re-evaluated prior to induction Oxygen Delivery Method: Simple face mask

## 2018-08-24 NOTE — Anesthesia Procedure Notes (Signed)
Spinal  Patient location during procedure: OR Start time: 08/24/2018 7:37 AM End time: 08/24/2018 7:40 AM Staffing Resident/CRNA: Niel Hummer, CRNA Performed: resident/CRNA  Preanesthetic Checklist Completed: patient identified, surgical consent, pre-op evaluation, IV checked, risks and benefits discussed and monitors and equipment checked Spinal Block Patient position: sitting Prep: DuraPrep Patient monitoring: blood pressure, continuous pulse ox and heart rate Approach: midline Location: L3-4 Injection technique: single-shot Needle Needle type: Pencan  Needle gauge: 24 G

## 2018-08-24 NOTE — Anesthesia Procedure Notes (Signed)
   Anesthesia Regional Block: Adductor canal block   Pre-Anesthetic Checklist: ,, timeout performed, Correct Patient, Correct Site, Correct Laterality, Correct Procedure, Correct Position, site marked, Risks and benefits discussed,  Surgical consent,  Pre-op evaluation,  At surgeon's request and post-op pain management  Laterality: Left  Prep: chloraprep       Needles:  Injection technique: Single-shot  Needle Type: Echogenic Stimulator Needle     Needle Length: 5cm  Needle Gauge: 22     Additional Needles:   Procedures:, nerve stimulator,,, ultrasound used (permanent image in chart),,,,   Nerve Stimulator or Paresthesia:  Response: quadraceps contraction, 0.45 mA,   Additional Responses:   Narrative:  Start time: 08/24/2018 6:55 AM End time: 08/24/2018 7:00 AM Injection made incrementally with aspirations every 5 mL.  Performed by: Personally  Anesthesiologist: Janeece Riggers, MD  Additional Notes: Functioning IV was confirmed and monitors were applied.  A 43mm 22ga Arrow echogenic stimulator needle was used. Sterile prep and drape,hand hygiene and sterile gloves were used. Ultrasound guidance: relevant anatomy identified, needle position confirmed, local anesthetic spread visualized around nerve(s)., vascular puncture avoided.  Image printed for medical record. Negative aspiration and negative test dose prior to incremental administration of local anesthetic. The patient tolerated the procedure well.

## 2018-08-24 NOTE — Op Note (Signed)
Lori Mooney, Lori Mooney MEDICAL RECORD VO:[id number] ACCOUNT [id number] DATE OF BIRTH:20-Jul-1945 FACILITY: [facility] LOCATION: [facility] PHYSICIAN:Mialani Reicks Fransico Setters, MD  OPERATIVE REPORT  DATE OF PROCEDURE:  08/24/2018  SURGEON:  [name], MD  ASSISTANT:  Ardeen Jourdain, PA  PREOPERATIVE DIAGNOSES: 1.  Severe degenerative arthritis of the left knee. 2.  Flexion contracture, left knee.  POSTOPERATIVE DIAGNOSES: 1.  Severe degenerative arthritis of the left knee. 2.  Flexion contracture, left knee.  OPERATION:  Left total knee arthroplasty utilizing the DePuy system.  All 3 components were cemented.  The sizes used were as follows:  The patella was a size 35 with 3 prongs.  The femoral component was a size 3 left posterior cruciate sacrificing type.   The tibial tray was a size 2.5.  The insert was a size 3, 17 mm thickness.  DESCRIPTION OF PROCEDURE:  Under spinal anesthesia and an adductor block of the left leg, a routine orthopedic prepping and draping, left lower extremity, was carried out.  At this time, the appropriate time-out was carried out.  I also marked the  appropriate left leg in the holding area.  The patient had 2 g of IV Ancef.  After sterile prep and draping, the leg was exsanguinated with an Esmarch, tourniquet was elevated to 325 mmHg.  The leg then was placed in the [facility] knee holder.  At this  time, an anterior approach to the knee was carried out.  Two flaps were created in the usual fashion.  Bleeders identified and cauterized.  At this time, a median parapatellar approach was created.  I then reflected the patella laterally, flexed the  knee, and did medial and lateral meniscectomies and excised the anterior and posterior cruciate ligament.  First, attention then was directed to the distal femur.  Drill hole was made in the usual fashion.  A guide rod was inserted up the femoral canal  with ease.  At this time, we removed 12 mm thickness off of the distal  femur utilizing the appropriate guide.  Following that, we measured the femur to be a size 3.  We then carried out anterior and posterior chamfering cuts for a size 3 left femoral  component.  Next, attention was directed to the tibia.  We then removed the spinous processes with the oscillating saw.  We then inserted the external guide and took the appropriate amount of bone off utilizing 4 mm thickness from the medial side as a  reference.  After this, we measured the tray to be a 2.5.  We tried a 3.  It was too big.  We then tried the 2.5.  After this was carried out, we inserted our lamina spreaders.  There were no spurs noted.  We then inserted our trial spacer block.  At  this particular point, we elected to use a size 17 prosthesis.  We then went on and cut our keel cutout of the proximal tibia in the usual fashion.  We then did our notch cut out of the distal femur.  Trial components were inserted, and we had excellent  function.  We first tried a 15 mm thickness insert.  It was a little loose, so we went to a 17, which fit very nicely.  We then removed the trial components, waterpik'd the knee out, dried the knee out, and cemented all 3 components in simultaneously.   Once the cement was hardened, all loose pieces of cement were removed.  We did a resurfacing procedure prior to  that obviously on the patella for a 35 patella.  Three drill holes were made in the patella surface.  After all 3 components were cemented and  cement was removed, we thoroughly waterpik'd out the knee again.  I then inserted my permanent size 17 mm thickness rotating platform size 3, reduced the knee, and had excellent function.  The knee then was closed over a Hemovac drain.  The wound, as I  mentioned, was closed in the usual fashion.  We then applied sterile dressings, and the patient will be admitted at least for overnight or 2 nights for observation.  At the end of the procedure, we injected 20 mL of Exparel and 20 mL of  normal saline.  LN/NUANCE  D:08/24/2018 T:08/24/2018 JOB:[id number]

## 2018-08-25 ENCOUNTER — Encounter (HOSPITAL_COMMUNITY): Payer: Self-pay | Admitting: Orthopedic Surgery

## 2018-08-25 DIAGNOSIS — E669 Obesity, unspecified: Secondary | ICD-10-CM | POA: Diagnosis not present

## 2018-08-25 DIAGNOSIS — E7800 Pure hypercholesterolemia, unspecified: Secondary | ICD-10-CM | POA: Diagnosis not present

## 2018-08-25 DIAGNOSIS — M1712 Unilateral primary osteoarthritis, left knee: Secondary | ICD-10-CM | POA: Diagnosis not present

## 2018-08-25 DIAGNOSIS — K219 Gastro-esophageal reflux disease without esophagitis: Secondary | ICD-10-CM | POA: Diagnosis not present

## 2018-08-25 DIAGNOSIS — I119 Hypertensive heart disease without heart failure: Secondary | ICD-10-CM | POA: Diagnosis not present

## 2018-08-25 DIAGNOSIS — M24562 Contracture, left knee: Secondary | ICD-10-CM | POA: Diagnosis not present

## 2018-08-25 LAB — CBC
HCT: 36.4 % (ref 36.0–46.0)
Hemoglobin: 11.5 g/dL — ABNORMAL LOW (ref 12.0–15.0)
MCH: 29 pg (ref 26.0–34.0)
MCHC: 31.6 g/dL (ref 30.0–36.0)
MCV: 91.7 fL (ref 80.0–100.0)
Platelets: 247 10*3/uL (ref 150–400)
RBC: 3.97 MIL/uL (ref 3.87–5.11)
RDW: 13.7 % (ref 11.5–15.5)
WBC: 11.7 10*3/uL — ABNORMAL HIGH (ref 4.0–10.5)
nRBC: 0 % (ref 0.0–0.2)

## 2018-08-25 LAB — BASIC METABOLIC PANEL
Anion gap: 9 (ref 5–15)
BUN: 17 mg/dL (ref 8–23)
CO2: 24 mmol/L (ref 22–32)
Calcium: 8.7 mg/dL — ABNORMAL LOW (ref 8.9–10.3)
Chloride: 104 mmol/L (ref 98–111)
Creatinine, Ser: 0.86 mg/dL (ref 0.44–1.00)
GFR calc Af Amer: >60 mL/min (ref >60)
GFR calc non Af Amer: >60 mL/min (ref >60)
Glucose, Bld: 128 mg/dL — ABNORMAL HIGH (ref 70–99)
Potassium: 4.3 mmol/L (ref 3.5–5.1)
Sodium: 137 mmol/L (ref 135–145)

## 2018-08-25 MED ORDER — OXYCODONE HCL 5 MG PO TABS: 5.0000 mg | ORAL_TABLET | Freq: Four times a day (QID) | ORAL | 0 refills | Status: DC | PRN

## 2018-08-25 MED ORDER — METHOCARBAMOL 500 MG PO TABS: 500.0000 mg | ORAL_TABLET | Freq: Four times a day (QID) | ORAL | 1 refills | Status: DC | PRN

## 2018-08-25 MED ORDER — ASPIRIN EC 325 MG PO TBEC: 325.0000 mg | DELAYED_RELEASE_TABLET | Freq: Two times a day (BID) | ORAL | 0 refills | Status: DC

## 2018-08-25 NOTE — Plan of Care (Signed)
progressing 

## 2018-08-25 NOTE — Progress Notes (Signed)
Physical Therapy Treatment Patient Details Name: Lori Mooney MRN: [id number] DOB: 03-Jan-1946 Today's Date: 08/25/2018    History of Present Illness Lori Mooney, 73 y.o. female POD 0 for Lt TKA.    PT Comments    Patient complaining of increased knee pain this session but agreeable to ambulate with therapy. Patient required cues initially for hand placement on RW but maintained safe proximity to walker throughout ambulation and transfers for remainder of session. Pt continues to be Mod Independent with bed mobility and ended sessio with patient resting supine in bed. Reviewed ankle pumps as she expressed concern for swelling in Lt LE. Encouraged to continue with supine exercises while resting in bed. She will continue to benefit from Acute PT services to address stair mobility in preparation for safe discharge home.     Follow Up Recommendations  Follow surgeon's recommendation for DC plan and follow-up therapies     Equipment Recommendations  None recommended by PT    Recommendations for Other Services       Precautions / Restrictions Precautions Precautions: Fall Restrictions Weight Bearing Restrictions: Yes RLE Weight Bearing: Weight bearing as tolerated LLE Weight Bearing: Weight bearing as tolerated    Mobility  Bed Mobility Overal bed mobility: Needs Assistance Bed Mobility: Sit to Supine;Supine to Sit     Supine to sit: Modified independent (Device/Increase time) Sit to supine: Modified independent (Device/Increase time)   General bed mobility comments: pt using bed rails and HOB elevated, pt slow due to Lt knee pain  Transfers Overall transfer level: Needs assistance Equipment used: Rolling walker (2 wheeled) Transfers: Sit to/from Stand Sit to Stand: Supervision         General transfer comment: cues initially for safe hand placement on RW, pt able to initiate stand wtih out assist  Ambulation/Gait Ambulation/Gait assistance: Min  guard Gait Distance (Feet): 100 Feet Assistive device: Rolling walker (2 wheeled) Gait Pattern/deviations: Step-through pattern;Decreased stance time - left;Decreased stride length;Decreased step length - left Gait velocity: slow   General Gait Details: pt with increased antalgia at start and gait quality improved as she mobilized, pt with safe hand position   Stairs             Wheelchair Mobility    Modified Rankin (Stroke Patients Only)       Balance Overall balance assessment: Needs assistance Sitting-balance support: Feet supported;No upper extremity supported Sitting balance-Leahy Scale: Good     Standing balance support: No upper extremity supported;Bilateral upper extremity supported;During functional activity Standing balance-Leahy Scale: Fair Standing balance comment: pt continues to require UE support for dynamic standing and gait                            Cognition Arousal/Alertness: Awake/alert Behavior During Therapy: WFL for tasks assessed/performed Overall Cognitive Status: Within Functional Limits for tasks assessed                     Exercises Total Joint Exercises Ankle Circles/Pumps: AROM;10 reps;Both;Supine    General Comments        Pertinent Vitals/Pain Pain Assessment: 0-10 Pain Score: 4  Pain Location: Lt knee Pain Descriptors / Indicators: Aching;Sore Pain Intervention(s): Limited activity within patient's tolerance;Monitored during session;Ice applied           PT Goals (current goals can now be found in the care plan section) Acute Rehab PT Goals Patient Stated Goal: pt wants to be able to get  in/out of bed safely and to go up and down stairs safely PT Goal Formulation: With patient Time For Goal Achievement: 08/28/18 Potential to Achieve Goals: Good Progress towards PT goals: Progressing toward goals    Frequency    7X/week      PT Plan Current plan remains appropriate    Co-evaluation               AM-PAC PT "6 Clicks" Mobility   Outcome Measure  Help needed turning from your back to your side while in a flat bed without using bedrails?: A Little Help needed moving from lying on your back to sitting on the side of a flat bed without using bedrails?: A Little Help needed moving to and from a bed to a chair (including a wheelchair)?: A Little Help needed standing up from a chair using your arms (e.g., wheelchair or bedside chair)?: A Little Help needed to walk in hospital room?: A Little Help needed climbing 3-5 steps with a railing? : A Lot 6 Click Score: 17    End of Session Equipment Utilized During Treatment: Gait belt Activity Tolerance: Patient tolerated treatment well Patient left: in bed;with call bell/phone within reach;with bed alarm set Nurse Communication: Mobility status PT Visit Diagnosis: Unsteadiness on feet (R26.81);Other abnormalities of gait and mobility (R26.89);Muscle weakness (generalized) (M62.81);Difficulty in walking, not elsewhere classified (R26.2);Pain Pain - Right/Left: Left Pain - part of body: Knee     Time: 6720-9470 PT Time Calculation (min) (ACUTE ONLY): 29 min  Charges:  $Gait Training: 8-22 mins $Therapeutic Activity: 8-22 mins                     Kipp Brood, PT, DPT, [facility] Physical Therapist with [facility]  08/25/2018 6:38 PM

## 2018-08-25 NOTE — Progress Notes (Signed)
Physical Therapy Treatment Patient Details Name: Lori Mooney MRN: [id number] DOB: [birth year] Today's Date: 08/25/2018    History of Present Illness Jearl Klinefelter Nolton, 73 y.o. female POD 0 for Lt TKA.    PT Comments    Patient is progressing well with therapy. She demonstrated good carryover with bed mobiltiy and transfers with RW. Patient was able to ambulate 100' this session with min guard and was instructed on HEP for ROM. Patient remains limited with pain during AROM knee flexion and is limited from 5-40* in supine. She will benefit from additional skilled acute PT for stair training and to progress gait and all functional mobility.    Follow Up Recommendations  Follow surgeon's recommendation for DC plan and follow-up therapies     Equipment Recommendations  None recommended by PT    Recommendations for Other Services       Precautions / Restrictions Precautions Precautions: Fall Restrictions Weight Bearing Restrictions: Yes RLE Weight Bearing: Weight bearing as tolerated LLE Weight Bearing: Weight bearing as tolerated    Mobility  Bed Mobility Overal bed mobility: Needs Assistance Bed Mobility: Supine to Sit;Sit to Supine;Rolling Rolling: Independent   Supine to sit: Modified independent (Device/Increase time) Sit to supine: Modified independent (Device/Increase time)   General bed mobility comments: pt required minimal verbal cues initially to sequence supine to sit EOB initially. At EOS pt performed bed mobility for supine to sidelying Rt and Lt to sit EOB, with good carryover from start of session and no cues required.  Transfers Overall transfer level: Needs assistance Equipment used: Rolling walker (2 wheeled) Transfers: Sit to/from Stand Sit to Stand: Min guard         General transfer comment: pt with good carry over from yesterday for saf hand placement on RW, required cues to keep RW closer during transfer from bedside  recliner  Ambulation/Gait Ambulation/Gait assistance: Min guard Gait Distance (Feet): 100 Feet Assistive device: Rolling walker (2 wheeled) Gait Pattern/deviations: Step-through pattern;Decreased stance time - left;Decreased stride length;Decreased step length - left Gait velocity: slow   General Gait Details: pt with steady gait with RW, distance limited due to fatigue   Stairs             Wheelchair Mobility    Modified Rankin (Stroke Patients Only)       Balance Overall balance assessment: Needs assistance Sitting-balance support: Feet supported;No upper extremity supported Sitting balance-Leahy Scale: Good     Standing balance support: No upper extremity supported;Bilateral upper extremity supported;During functional activity Standing balance-Leahy Scale: Fair Standing balance comment: pt requires UE support for dynamic standing and gait, remains stale with no UE support during static standing                 Cognition Arousal/Alertness: Awake/alert Behavior During Therapy: WFL for tasks assessed/performed Overall Cognitive Status: Within Functional Limits for tasks assessed                 Exercises Total Joint Exercises Ankle Circles/Pumps: AROM;10 reps;Seated;Both Heel Slides: Left;10 reps;Supine;AAROM(with gait belt for AAROM) Hip ABduction/ADduction: AROM;Left;10 reps;Supine Straight Leg Raises: AROM;Left;5 reps;Supine Long Arc Quad: AROM;Left;5 reps;Seated    General Comments        Pertinent Vitals/Pain Pain Assessment: 0-10 Pain Score: 2  Pain Location: Lt knee Pain Descriptors / Indicators: Aching;Sore Pain Intervention(s): Limited activity within patient's tolerance;Monitored during session;Ice applied           PT Goals (current goals can now be found in the care plan  section) Acute Rehab PT Goals Patient Stated Goal: pt wants to be able to get in/out of bed safely and to go up and down stairs safely PT Goal Formulation:  With patient Time For Goal Achievement: 08/28/18 Potential to Achieve Goals: Good Progress towards PT goals: Progressing toward goals    Frequency    7X/week      PT Plan Current plan remains appropriate       AM-PAC PT "6 Clicks" Mobility   Outcome Measure  Help needed turning from your back to your side while in a flat bed without using bedrails?: A Little Help needed moving from lying on your back to sitting on the side of a flat bed without using bedrails?: A Little Help needed moving to and from a bed to a chair (including a wheelchair)?: A Little Help needed standing up from a chair using your arms (e.g., wheelchair or bedside chair)?: A Little Help needed to walk in hospital room?: A Little Help needed climbing 3-5 steps with a railing? : A Lot 6 Click Score: 17    End of Session Equipment Utilized During Treatment: Gait belt Activity Tolerance: Patient tolerated treatment well Patient left: in chair;with call bell/phone within reach;with chair alarm set Nurse Communication: Mobility status PT Visit Diagnosis: Unsteadiness on feet (R26.81);Other abnormalities of gait and mobility (R26.89);Muscle weakness (generalized) (M62.81);Difficulty in walking, not elsewhere classified (R26.2);Pain Pain - Right/Left: Left Pain - part of body: Knee     Time: 6387-5643 PT Time Calculation (min) (ACUTE ONLY): 32 min  Charges:  $Gait Training: 8-22 mins $Therapeutic Exercise: 8-22 mins                     Kipp Brood, PT, DPT, [facility] Physical Therapist with [facility]  08/25/2018 1:09 PM

## 2018-08-25 NOTE — Plan of Care (Signed)

## 2018-08-25 NOTE — Progress Notes (Signed)
Subjective: 1 Day Post-Op Procedure(s) (LRB): TOTAL KNEE ARTHROPLASTY (Left) Patient reports pain as 1 on 0-10 scale.Doing very well today. Output by Drain (mL) 08/23/18 0701 - 08/23/18 1900 08/23/18 1901 - 08/24/18 0700 08/24/18 0701 - 08/24/18 1900 08/24/18 1901 - 08/25/18 0700 08/25/18 0701 - 08/25/18 0727  Closed System Drain 1 Left;Lateral Knee Accordion (Hemovac) 10 Fr.   285 195    DCd  Wound looks fine.  Objective: Vital signs in last 24 hours: Temp:  [97.6 F (36.4 C)-98.1 F (36.7 C)] 97.9 F (36.6 C) (07/30 0546) Pulse Rate:  [72-91] 82 (07/30 0546) Resp:  [12-27] 18 (07/30 0546) BP: (79-126)/(41-87) 109/63 (07/30 0546) SpO2:  [96 %-100 %] 96 % (07/30 0546)  Intake/Output from previous day: 07/29 0701 - 07/30 0700 In: 3380.2 [P.O.:600; I.V.:2580.2; IV Piggyback:200] Out: 2955 [Urine:2425; Drains:480; Blood:50] Intake/Output this shift: No intake/output data recorded.  Recent Labs    08/23/18 1056 08/25/18 0310  HGB 14.8 11.5*   Recent Labs    08/23/18 1056 08/25/18 0310  WBC 6.5 11.7*  RBC 5.14* 3.97  HCT 46.8* 36.4  PLT 299 247   Recent Labs    08/25/18 0310  NA 137  K 4.3  CL 104  CO2 24  BUN 17  CREATININE 0.86  GLUCOSE 128*  CALCIUM 8.7*   Recent Labs    08/23/18 1049  INR 1.0    Neurologically intact No cellulitis present   Assessment/Plan: 1 Day Post-Op Procedure(s) (LRB): TOTAL KNEE ARTHROPLASTY (Left) Up with therapy.   Anticipated LOS equal to or greater than 2 midnights due to - Age 34 and older with one or more of the following:  - Obesity  - Expected need for hospital services (PT, OT, Nursing) required for safe  discharge  - Anticipated need for postoperative skilled nursing care or inpatient rehab  - Active co-morbidities: None OR   - Unanticipated findings during/Post Surgery: None  - Patient is a high risk of re-admission due to: None    Lori Mooney 08/25/2018, 7:26 AM

## 2018-08-26 DIAGNOSIS — M1712 Unilateral primary osteoarthritis, left knee: Secondary | ICD-10-CM | POA: Diagnosis not present

## 2018-08-26 DIAGNOSIS — E7800 Pure hypercholesterolemia, unspecified: Secondary | ICD-10-CM | POA: Diagnosis not present

## 2018-08-26 DIAGNOSIS — M24562 Contracture, left knee: Secondary | ICD-10-CM | POA: Diagnosis not present

## 2018-08-26 DIAGNOSIS — E669 Obesity, unspecified: Secondary | ICD-10-CM | POA: Diagnosis not present

## 2018-08-26 DIAGNOSIS — K219 Gastro-esophageal reflux disease without esophagitis: Secondary | ICD-10-CM | POA: Diagnosis not present

## 2018-08-26 DIAGNOSIS — I119 Hypertensive heart disease without heart failure: Secondary | ICD-10-CM | POA: Diagnosis not present

## 2018-08-26 LAB — BASIC METABOLIC PANEL
Anion gap: 8 (ref 5–15)
BUN: 14 mg/dL (ref 8–23)
CO2: 26 mmol/L (ref 22–32)
Calcium: 8.5 mg/dL — ABNORMAL LOW (ref 8.9–10.3)
Chloride: 103 mmol/L (ref 98–111)
Creatinine, Ser: 0.87 mg/dL (ref 0.44–1.00)
GFR calc Af Amer: >60 mL/min (ref >60)
GFR calc non Af Amer: >60 mL/min (ref >60)
Glucose, Bld: 107 mg/dL — ABNORMAL HIGH (ref 70–99)
Potassium: 3.8 mmol/L (ref 3.5–5.1)
Sodium: 137 mmol/L (ref 135–145)

## 2018-08-26 LAB — CBC
HCT: 32.6 % — ABNORMAL LOW (ref 36.0–46.0)
Hemoglobin: 10.3 g/dL — ABNORMAL LOW (ref 12.0–15.0)
MCH: 29.3 pg (ref 26.0–34.0)
MCHC: 31.6 g/dL (ref 30.0–36.0)
MCV: 92.9 fL (ref 80.0–100.0)
Platelets: 212 10*3/uL (ref 150–400)
RBC: 3.51 MIL/uL — ABNORMAL LOW (ref 3.87–5.11)
RDW: 14.1 % (ref 11.5–15.5)
WBC: 10.8 10*3/uL — ABNORMAL HIGH (ref 4.0–10.5)
nRBC: 0 % (ref 0.0–0.2)

## 2018-08-26 NOTE — Progress Notes (Signed)
Physical Therapy Treatment Patient Details Name: Lori Mooney MRN: [id number] DOB: Sep 03, 1945 Today's Date: 08/26/2018    History of Present Illness Lori Mooney, 73 y.o. female POD 0 for Lt TKA.    PT Comments    Pt did well with step and appears to feel more confident in her mobility.  She does con't to have limited knee flexion ROM. Instructed in exercises below as well as seated knee flexion with towel under foot with stretch.     Follow Up Recommendations  Follow surgeon's recommendation for DC plan and follow-up therapies     Equipment Recommendations  None recommended by PT    Recommendations for Other Services       Precautions / Restrictions Precautions Precautions: Fall Restrictions Weight Bearing Restrictions: Yes RLE Weight Bearing: Weight bearing as tolerated LLE Weight Bearing: Weight bearing as tolerated    Mobility  Bed Mobility               General bed mobility comments: Pt had just gotten up to recliner with nursing upon arrival.  Transfers Overall transfer level: Needs assistance Equipment used: Rolling walker (2 wheeled) Transfers: Sit to/from Stand Sit to Stand: Supervision         General transfer comment: good recall of technique  Ambulation/Gait Ambulation/Gait assistance: Min guard Gait Distance (Feet): 60 Feet Assistive device: Rolling walker (2 wheeled) Gait Pattern/deviations: Step-through pattern;Decreased stance time - left Gait velocity: decreased   General Gait Details: Gait pattern improved with distance.   Stairs Stairs: Yes Stairs assistance: Min guard Stair Management: Forwards;Backwards;With walker Number of Stairs: 1(x 2 reps) General stair comments: Pt practiced going forwards and backwards with RW.  Demonstrated side stepping with rail for other entrance, but she did not perform.   Wheelchair Mobility    Modified Rankin (Stroke Patients Only)       Balance Overall balance assessment:  Needs assistance Sitting-balance support: Feet supported;No upper extremity supported Sitting balance-Leahy Scale: Good     Standing balance support: No upper extremity supported;Bilateral upper extremity supported;During functional activity Standing balance-Leahy Scale: Fair Standing balance comment: requires UE support for dynamic                            Cognition Arousal/Alertness: Awake/alert Behavior During Therapy: WFL for tasks assessed/performed Overall Cognitive Status: Within Functional Limits for tasks assessed                                        Exercises Total Joint Exercises Ankle Circles/Pumps: AROM;10 reps;Both;Supine Quad Sets: Strengthening;Left;10 reps Heel Slides: AAROM;Both;10 reps Hip ABduction/ADduction: AROM;Left;10 reps    General Comments General comments (skin integrity, edema, etc.): Limited L knee flexion of ~25-30 degrees with assistance. Leg does appear swollen.      Pertinent Vitals/Pain Pain Assessment: 0-10 Pain Score: 3  Pain Location: Lt knee Pain Descriptors / Indicators: Aching;Sore Pain Intervention(s): Limited activity within patient's tolerance;Monitored during session;Repositioned;Ice applied    Home Living                      Prior Function            PT Goals (current goals can now be found in the care plan section) Acute Rehab PT Goals Patient Stated Goal: pt wants to be able to get in/out of bed safely and to  go up and down stairs safely PT Goal Formulation: With patient Time For Goal Achievement: 08/28/18 Potential to Achieve Goals: Good Progress towards PT goals: Progressing toward goals    Frequency    7X/week      PT Plan Current plan remains appropriate    Co-evaluation              AM-PAC PT "6 Clicks" Mobility   Outcome Measure  Help needed turning from your back to your side while in a flat bed without using bedrails?: A Little Help needed moving from  lying on your back to sitting on the side of a flat bed without using bedrails?: A Little Help needed moving to and from a bed to a chair (including a wheelchair)?: A Little Help needed standing up from a chair using your arms (e.g., wheelchair or bedside chair)?: A Little Help needed to walk in hospital room?: A Little Help needed climbing 3-5 steps with a railing? : A Little 6 Click Score: 18    End of Session Equipment Utilized During Treatment: Gait belt Activity Tolerance: Patient tolerated treatment well Patient left: in chair;with call bell/phone within reach;with chair alarm set Nurse Communication: Mobility status PT Visit Diagnosis: Unsteadiness on feet (R26.81);Other abnormalities of gait and mobility (R26.89);Muscle weakness (generalized) (M62.81);Difficulty in walking, not elsewhere classified (R26.2);Pain Pain - Right/Left: Left Pain - part of body: Knee     Time: 0911-0950(no charge for time getting step) PT Time Calculation (min) (ACUTE ONLY): 39 min  Charges:  $Gait Training: 8-22 mins $Therapeutic Exercise: 8-22 mins                     Nyle Limb L. Tamala Julian, [place] Pager [phone] 08/26/2018    Galen Manila 08/26/2018, 10:41 AM

## 2018-08-26 NOTE — Plan of Care (Signed)
  Problem: Education: Goal: Knowledge of General Education information will improve Description: Including pain rating scale, medication(s)/side effects and non-pharmacologic comfort measures 08/26/2018 1218 by Hubert Azure, RN Outcome: Adequate for Discharge 08/26/2018 1215 by Hubert Azure, RN Outcome: Adequate for Discharge   Problem: Health Behavior/Discharge Planning: Goal: Ability to manage health-related needs will improve 08/26/2018 1218 by Hubert Azure, RN Outcome: Adequate for Discharge 08/26/2018 1215 by Hubert Azure, RN Outcome: Adequate for Discharge   Problem: Clinical Measurements: Goal: Ability to maintain clinical measurements within normal limits will improve 08/26/2018 1218 by Hubert Azure, RN Outcome: Adequate for Discharge 08/26/2018 1215 by Hubert Azure, RN Outcome: Adequate for Discharge Goal: Will remain free from infection 08/26/2018 1218 by Hubert Azure, RN Outcome: Adequate for Discharge 08/26/2018 1215 by Hubert Azure, RN Outcome: Adequate for Discharge Goal: Diagnostic test results will improve 08/26/2018 1218 by Hubert Azure, RN Outcome: Adequate for Discharge 08/26/2018 1215 by Hubert Azure, RN Outcome: Adequate for Discharge Goal: Respiratory complications will improve 08/26/2018 1218 by Hubert Azure, RN Outcome: Adequate for Discharge 08/26/2018 1215 by Hubert Azure, RN Outcome: Adequate for Discharge Goal: Cardiovascular complication will be avoided 08/26/2018 1218 by Hubert Azure, RN Outcome: Adequate for Discharge 08/26/2018 1215 by Hubert Azure, RN Outcome: Adequate for Discharge   Problem: Activity: Goal: Risk for activity intolerance will decrease 08/26/2018 1218 by Hubert Azure, RN Outcome: Adequate for Discharge 08/26/2018 1215 by Hubert Azure, RN Outcome: Adequate for Discharge   Problem: Nutrition: Goal: Adequate nutrition will be maintained 08/26/2018 1218 by Hubert Azure, RN Outcome:  Adequate for Discharge 08/26/2018 1215 by Hubert Azure, RN Outcome: Adequate for Discharge   Problem: Coping: Goal: Level of anxiety will decrease 08/26/2018 1218 by Hubert Azure, RN Outcome: Adequate for Discharge 08/26/2018 1215 by Hubert Azure, RN Outcome: Adequate for Discharge   Problem: Elimination: Goal: Will not experience complications related to bowel motility 08/26/2018 1218 by Hubert Azure, RN Outcome: Adequate for Discharge 08/26/2018 1215 by Hubert Azure, RN Outcome: Adequate for Discharge Goal: Will not experience complications related to urinary retention 08/26/2018 1218 by Hubert Azure, RN Outcome: Adequate for Discharge 08/26/2018 1215 by Hubert Azure, RN Outcome: Adequate for Discharge   Problem: Pain Managment: Goal: General experience of comfort will improve 08/26/2018 1218 by Hubert Azure, RN Outcome: Adequate for Discharge 08/26/2018 1215 by Hubert Azure, RN Outcome: Adequate for Discharge   Problem: Safety: Goal: Ability to remain free from injury will improve 08/26/2018 1218 by Hubert Azure, RN Outcome: Adequate for Discharge 08/26/2018 1215 by Hubert Azure, RN Outcome: Adequate for Discharge   Problem: Skin Integrity: Goal: Risk for impaired skin integrity will decrease 08/26/2018 1218 by Hubert Azure, RN Outcome: Adequate for Discharge 08/26/2018 1215 by Hubert Azure, RN Outcome: Adequate for Discharge

## 2018-08-26 NOTE — Plan of Care (Signed)

## 2018-08-26 NOTE — Progress Notes (Signed)
Subjective: 2 Days Post-Op Procedure(s) (LRB): TOTAL KNEE ARTHROPLASTY (Left) Patient reports pain as 3 on 0-10 scale. Doing well. Will DC.   Objective: Vital signs in last 24 hours: Temp:  [98.9 F (37.2 C)-99.6 F (37.6 C)] 99.6 F (37.6 C) (07/31 0534) Pulse Rate:  [85-105] 95 (07/31 0534) Resp:  [16] 16 (07/31 0534) BP: (128-149)/(66-84) 131/69 (07/31 0534) SpO2:  [99 %-100 %] 99 % (07/31 0534)  Intake/Output from previous day: 07/30 0701 - 07/31 0700 In: 1572.6 [P.O.:960; I.V.:612.6] Out: 1325 [Urine:1325] Intake/Output this shift: No intake/output data recorded.  Recent Labs    08/23/18 1056 08/25/18 0310 08/26/18 0310  HGB 14.8 11.5* 10.3*   Recent Labs    08/25/18 0310 08/26/18 0310  WBC 11.7* 10.8*  RBC 3.97 3.51*  HCT 36.4 32.6*  PLT 247 212   Recent Labs    08/25/18 0310 08/26/18 0310  NA 137 137  K 4.3 3.8  CL 104 103  CO2 24 26  BUN 17 14  CREATININE 0.86 0.87  GLUCOSE 128* 107*  CALCIUM 8.7* 8.5*   Recent Labs    08/23/18 1049  INR 1.0    Neurologically intact Dorsiflexion/Plantar flexion intact   Assessment/Plan: 2 Days Post-Op Procedure(s) (LRB): TOTAL KNEE ARTHROPLASTY (Left) Up with therapy.Will Dc      [name] 08/26/2018, 7:06 AM

## 2018-08-26 NOTE — Progress Notes (Signed)
Therapy Plan: Outpatient therapy at [facility] Patient has DME (RW and 3 in1)

## 2018-08-26 NOTE — Progress Notes (Signed)
   Subjective: 2 Days Post-Op Procedure(s) (LRB): TOTAL KNEE ARTHROPLASTY (Left) Patient reports pain as mild.   Patient seen in rounds by Dr. Gladstone Lighter. Patient is well, and has had no acute complaints or problems. Ready for discharge. Voiding without difficulty, no issues overnight.  Plan is to go Home after hospital stay.  Objective: Vital signs in last 24 hours: Temp:  [98.9 F (37.2 C)-99.6 F (37.6 C)] 99.6 F (37.6 C) (07/31 0534) Pulse Rate:  [85-105] 95 (07/31 0534) Resp:  [16] 16 (07/31 0534) BP: (128-149)/(66-84) 131/69 (07/31 0534) SpO2:  [99 %-100 %] 99 % (07/31 0534)  Intake/Output from previous day:  Intake/Output Summary (Last 24 hours) at 08/26/2018 0755 Last data filed at 08/26/2018 0530 Gross per 24 hour  Intake 1572.56 ml  Output 1325 ml  Net 247.56 ml    Intake/Output this shift: No intake/output data recorded.  Labs: Recent Labs    08/23/18 1056 08/25/18 0310 08/26/18 0310  HGB 14.8 11.5* 10.3*   Recent Labs    08/25/18 0310 08/26/18 0310  WBC 11.7* 10.8*  RBC 3.97 3.51*  HCT 36.4 32.6*  PLT 247 212   Recent Labs    08/25/18 0310 08/26/18 0310  NA 137 137  K 4.3 3.8  CL 104 103  CO2 24 26  BUN 17 14  CREATININE 0.86 0.87  GLUCOSE 128* 107*  CALCIUM 8.7* 8.5*   Recent Labs    08/23/18 1049  INR 1.0    Exam: General - Patient is Alert and Oriented Extremity - Neurologically intact Neurovascular intact Sensation intact distally Dorsiflexion/Plantar flexion intact Motor Function - intact, moving foot and toes well on exam.   Past Medical History:  Diagnosis Date  . Allergy   . 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 Cataract   . Exogenous obesity   . GERD (gastroesophageal reflux disease)   . Hyperlipidemia    hypercholesterolemia  . Hypertension     Assessment/Plan: 2 Days Post-Op Procedure(s) (LRB): TOTAL KNEE ARTHROPLASTY (Left) Active Problems:   H/O total knee replacement, left  Estimated  body mass index is 35.96 kg/m as calculated from the following:   Height as of this encounter: 5\' 3"  (1.6 m).   Weight as of this encounter: 92.1 kg. Up with therapy   DVT Prophylaxis - Xarelto Weight-bearing as tolerated  Plan for discharge today.  Follow-up in the office with Dr. Gladstone Lighter in 2 weeks.   Theresa Duty, PA-C Orthopedic Surgery 08/26/2018, 7:55 AM

## 2018-08-29 DIAGNOSIS — M25662 Stiffness of left knee, not elsewhere classified: Secondary | ICD-10-CM | POA: Diagnosis not present

## 2018-08-29 NOTE — Discharge Summary (Signed)
Physician Discharge Summary   Patient ID: Lori Mooney MRN: [id number] DOB/AGE: [birth year] 73 y.o.  Admit date: 08/24/2018 Discharge date: 08/26/2018  Primary Diagnosis: Primary osteoarthritis left knee   Admission Diagnoses:  Past Medical History:  Diagnosis Date   Allergy    Arthritis    history of arthritis in the fingers   Cataract    Exogenous obesity    GERD (gastroesophageal reflux disease)    Hyperlipidemia    hypercholesterolemia   Hypertension    Discharge Diagnoses:   Active Problems:   H/O total knee replacement, left  Estimated body mass index is 35.96 kg/m as calculated from the following:   Height as of this encounter: 5\' 3"  (1.6 m).   Weight as of this encounter: 92.1 kg.  Procedure:  Procedure(s) (LRB): TOTAL KNEE ARTHROPLASTY (Left)   Consults: None  HPI: Lori Mooney, 73 y.o. female, has a history of pain and functional disability in the left knee due to arthritis and has failed non-surgical conservative treatments for greater than 12 weeks to includeNSAID's and/or analgesics, corticosteriod injections, viscosupplementation injections, flexibility and strengthening excercises and activity modification.  Onset of symptoms was gradual, starting 5 years ago with gradually worsening course since that time. The patient noted no past surgery on the left knee(s).  Patient currently rates pain in the left knee(s) at 3 out of 10 with activity. Patient has night pain, worsening of pain with activity and weight bearing, pain that interferes with activities of daily living, pain with passive range of motion, crepitus and joint swelling.  Patient has evidence of periarticular osteophytes and joint space narrowing by imaging studies. There is no active infection.  Laboratory Data: Admission on 08/24/2018, Discharged on 08/26/2018  Component Date Value Ref Range Status   WBC 08/25/2018 11.7* 4.0 - 10.5 K/uL Final   RBC 08/25/2018 3.97  3.87 - 5.11  MIL/uL Final   Hemoglobin 08/25/2018 11.5* 12.0 - 15.0 g/dL Final   HCT 08/25/2018 36.4  36.0 - 46.0 % Final   MCV 08/25/2018 91.7  80.0 - 100.0 fL Final   MCH 08/25/2018 29.0  26.0 - 34.0 pg Final   MCHC 08/25/2018 31.6  30.0 - 36.0 g/dL Final   RDW 08/25/2018 13.7  11.5 - 15.5 % Final   Platelets 08/25/2018 247  150 - 400 K/uL Final   nRBC 08/25/2018 0.0  0.0 - 0.2 % Final   Performed at [facility], [place] [address] [place], [place] [address]   Sodium 08/25/2018 137  135 - 145 mmol/L Final   Potassium 08/25/2018 4.3  3.5 - 5.1 mmol/L Final   Chloride 08/25/2018 104  98 - 111 mmol/L Final   CO2 08/25/2018 24  22 - 32 mmol/L Final   Glucose, Bld 08/25/2018 128* 70 - 99 mg/dL Final   BUN 08/25/2018 17  8 - 23 mg/dL Final   Creatinine, Ser 08/25/2018 0.86  0.44 - 1.00 mg/dL Final   Calcium 08/25/2018 8.7* 8.9 - 10.3 mg/dL Final   GFR calc non Af Amer 08/25/2018 >60  >60 mL/min Final   GFR calc Af Amer 08/25/2018 >60  >60 mL/min Final   Anion gap 08/25/2018 9  5 - 15 Final   Performed at [facility], [place] [address] [place], [place] [address]   WBC 08/26/2018 10.8* 4.0 - 10.5 K/uL Final   RBC 08/26/2018 3.51* 3.87 - 5.11 MIL/uL Final   Hemoglobin 08/26/2018 10.3* 12.0 - 15.0 g/dL Final   HCT 08/26/2018 32.6*  36.0 - 46.0 % Final   MCV 08/26/2018 92.9  80.0 - 100.0 fL Final   MCH 08/26/2018 29.3  26.0 - 34.0 pg Final   MCHC 08/26/2018 31.6  30.0 - 36.0 g/dL Final   RDW 08/26/2018 14.1  11.5 - 15.5 % Final   Platelets 08/26/2018 212  150 - 400 K/uL Final   nRBC 08/26/2018 0.0  0.0 - 0.2 % Final   Performed at [facility], [place] [address] [place], [place] [address]   Sodium 08/26/2018 137  135 - 145 mmol/L Final   Potassium 08/26/2018 3.8  3.5 - 5.1 mmol/L Final   Chloride 08/26/2018 103  98 - 111 mmol/L Final   CO2 08/26/2018 26  22 - 32 mmol/L Final   Glucose, Bld 08/26/2018 107* 70 - 99  mg/dL Final   BUN 08/26/2018 14  8 - 23 mg/dL Final   Creatinine, Ser 08/26/2018 0.87  0.44 - 1.00 mg/dL Final   Calcium 08/26/2018 8.5* 8.9 - 10.3 mg/dL Final   GFR calc non Af Amer 08/26/2018 >60  >60 mL/min Final   GFR calc Af Amer 08/26/2018 >60  >60 mL/min Final   Anion gap 08/26/2018 8  5 - 15 Final   Performed at [facility], [place] [address], [place], [place] [address]  Hospital Outpatient Visit on 08/23/2018  Component Date Value Ref Range Status   aPTT 08/23/2018 28  24 - 36 seconds Final   Performed at [facility], [place] [address], [place], [place] [address]   Prothrombin Time 08/23/2018 12.6  11.4 - 15.2 seconds Final   INR 08/23/2018 1.0  0.8 - 1.2 Final   Comment: (NOTE) INR goal varies based on device and disease states. Performed at [facility], [place] [address], [place], [place] [address]    ABO/RH(D) 08/23/2018 A POS   Final   Antibody Screen 08/23/2018 NEG   Final   Sample Expiration 08/23/2018 08/27/2018,2359   Final   Extend sample reason 08/23/2018    Final                   Value:NO TRANSFUSIONS OR PREGNANCY IN THE PAST 3 MONTHS Performed at [place] [address], [place], [place] [address]    MRSA, PCR 08/23/2018 NEGATIVE  NEGATIVE Final   Staphylococcus aureus 08/23/2018 NEGATIVE  NEGATIVE Final   Comment: (NOTE) The Xpert SA Assay (FDA approved for NASAL specimens in patients 69 years of age and older), is one component of a comprehensive surveillance program. It is not intended to diagnose infection nor to guide or monitor treatment. Performed at [facility], [place] [address] [place], [place] [address]    WBC 08/23/2018 6.5  4.0 - 10.5 K/uL Final   RBC 08/23/2018 5.14* 3.87 - 5.11 MIL/uL Final   Hemoglobin 08/23/2018 14.8  12.0 - 15.0 g/dL Final   HCT 08/23/2018 46.8* 36.0 - 46.0 % Final   MCV 08/23/2018 91.1  80.0 - 100.0 fL Final     MCH 08/23/2018 28.8  26.0 - 34.0 pg Final   MCHC 08/23/2018 31.6  30.0 - 36.0 g/dL Final   RDW 08/23/2018 13.6  11.5 - 15.5 % Final   Platelets 08/23/2018 299  150 - 400 K/uL Final   nRBC 08/23/2018 0.0  0.0 - 0.2 % Final   Performed at [facility], [place] [address] [place], [place] [address]   ABO/RH(D) 08/23/2018    Final  Value:A POS Performed at [facility], [place] [address] [place], [place] [address]   Hospital Outpatient Visit on 08/20/2018  Component Date Value Ref Range Status   SARS Coronavirus 2 08/20/2018 NEGATIVE  NEGATIVE Final   Comment: (NOTE) SARS-CoV-2 target nucleic acids are NOT DETECTED. The SARS-CoV-2 RNA is generally detectable in upper and lower respiratory specimens during the acute phase of infection. Negative results do not preclude SARS-CoV-2 infection, do not rule out co-infections with other pathogens, and should not be used as the sole basis for treatment or other patient management decisions. Negative results must be combined with clinical observations, patient history, and epidemiological information. The expected result is Negative. Fact Sheet for Patients: SugarRoll.be Fact Sheet for Healthcare Providers: https://www.woods-mathews.com/ This test is not yet approved or cleared by the [place] FDA and  has been authorized for detection and/or diagnosis of SARS-CoV-2 by FDA under an Emergency Use Authorization (EUA). This EUA will remain  in effect (meaning this test can be used) for the duration of the COVID-19 declaration under Section 56                          4(b)(1) of the Act, 21 U.S.C. section 360bbb-3(b)(1), unless the authorization is terminated or revoked sooner. Performed at [facility], [place] [address], [place], [place] [address]   Office Visit on 08/10/2018  Component Date Value Ref Range Status   Glucose 08/10/2018  81  65 - 99 mg/dL Final   BUN 08/10/2018 13  8 - 27 mg/dL Final   Creatinine, Ser 08/10/2018 0.87  0.57 - 1.00 mg/dL Final   GFR calc non Af Amer 08/10/2018 67  >59 mL/min/1.73 Final   GFR calc Af Amer 08/10/2018 77  >59 mL/min/1.73 Final   BUN/Creatinine Ratio 08/10/2018 15  12 - 28 Final   Sodium 08/10/2018 137  134 - 144 mmol/L Final   Potassium 08/10/2018 4.1  3.5 - 5.2 mmol/L Final   Chloride 08/10/2018 99  96 - 106 mmol/L Final   CO2 08/10/2018 21  20 - 29 mmol/L Final   Calcium 08/10/2018 10.0  8.7 - 10.3 mg/dL Final   Total Protein 08/10/2018 6.9  6.0 - 8.5 g/dL Final   Albumin 08/10/2018 4.6  3.7 - 4.7 g/dL Final   Globulin, Total 08/10/2018 2.3  1.5 - 4.5 g/dL Final   Albumin/Globulin Ratio 08/10/2018 2.0  1.2 - 2.2 Final   Bilirubin Total 08/10/2018 0.5  0.0 - 1.2 mg/dL Final   Alkaline Phosphatase 08/10/2018 74  39 - 117 IU/L Final   AST 08/10/2018 18  0 - 40 IU/L Final   ALT 08/10/2018 20  0 - 32 IU/L Final   Cholesterol, Total 08/10/2018 188  100 - 199 mg/dL Final   Triglycerides 08/10/2018 203* 0 - 149 mg/dL Final   HDL 08/10/2018 62  >39 mg/dL Final   VLDL Cholesterol Cal 08/10/2018 41* 5 - 40 mg/dL Final   LDL Calculated 08/10/2018 85  0 - 99 mg/dL Final   Chol/HDL Ratio 08/10/2018 3.0  0.0 - 4.4 ratio Final   Comment:                                   T. Chol/HDL Ratio  Men  Women                               1/2 Avg.Risk  3.4    3.3                                   Avg.Risk  5.0    4.4                                2X Avg.Risk  9.6    7.1                                3X Avg.Risk 23.4   11.0    Hgb A1c MFr Bld 08/10/2018 5.9* 4.8 - 5.6 % Final   Comment:          Prediabetes: 5.7 - 6.4          Diabetes: >6.4          Glycemic control for adults with diabetes: <7.0    Est. average glucose Bld gHb Est-m* 08/10/2018 123  mg/dL Final   WBC, UA 08/10/2018 11-30* 0 - 5 /hpf Final   RBC  08/10/2018 0-2  0 - 2 /hpf Final   Epithelial Cells (non renal) 08/10/2018 0-10  0 - 10 /hpf Final   Casts 08/10/2018 None seen  None seen /lpf Final   Mucus, UA 08/10/2018 Present  Not Estab. Final   Bacteria, UA 08/10/2018 Few  None seen/Few Final   WBC 08/10/2018 7.5  3.4 - 10.8 x10E3/uL Final   RBC 08/10/2018 5.08  3.77 - 5.28 x10E6/uL Final   Hemoglobin 08/10/2018 15.4  11.1 - 15.9 g/dL Final   Hematocrit 08/10/2018 45.3  34.0 - 46.6 % Final   MCV 08/10/2018 89  79 - 97 fL Final   MCH 08/10/2018 30.3  26.6 - 33.0 pg Final   MCHC 08/10/2018 34.0  31.5 - 35.7 g/dL Final   RDW 08/10/2018 13.5  11.7 - 15.4 % Final   Platelets 08/10/2018 305  150 - 450 x10E3/uL Final      Hospital Course: Keilee Denman is a 73 y.o. who was admitted to [facility]. They were brought to the operating room on 08/24/2018 and underwent Procedure(s): TOTAL KNEE ARTHROPLASTY.  Patient tolerated the procedure well and was later transferred to the recovery room and then to the orthopaedic floor for postoperative care.  They were given PO and IV analgesics for pain control following their surgery.  They were given 24 hours of postoperative antibiotics of  Anti-infectives (From admission, onward)   Start     Dose/Rate Route Frequency Ordered Stop   08/24/18 1400  ceFAZolin (ANCEF) IVPB 1 g/50 mL premix     1 g 100 mL/hr over 30 Minutes Intravenous Every 6 hours 08/24/18 1204 08/24/18 2056   08/24/18 0825  polymyxin B 500,000 Units, bacitracin 50,000 Units in sodium chloride 0.9 % 500 mL irrigation  Status:  Discontinued       As needed 08/24/18 0825 08/24/18 0955   08/24/18 0600  ceFAZolin (ANCEF) IVPB 2g/100 mL premix     2 g 200 mL/hr over 30 Minutes Intravenous On call to O.R. 08/24/18 0254 08/24/18 0810     and started  on DVT prophylaxis in the form of Xarelto.   PT and OT were ordered for total joint protocol.  Discharge planning consulted to help with postop disposition and  equipment needs.  Patient had a good night on the evening of surgery.  They started to get up OOB with therapy on day one. Hemovac drain was pulled without difficulty.  Continued to work with therapy into day two.  Dressing was changed on day two and the incision was clean and dry.  The patient had progressed with therapy and meeting their goals.  Incision was healing well.  Patient was seen in rounds and was ready to go home.   Diet: Cardiac diet Activity:WBAT Follow-up:in 2 weeks Disposition - Home Discharged Condition: stable   Discharge Instructions    Call MD / Call 911   Complete by: As directed    If you experience chest pain or shortness of breath, CALL 911 and be transported to the hospital emergency room.  If you develope a fever above 101 F, pus (white drainage) or increased drainage or redness at the wound, or calf pain, call your surgeon's office.   Constipation Prevention   Complete by: As directed    Drink plenty of fluids.  Prune juice may be helpful.  You may use a stool softener, such as Colace (over the counter) 100 mg twice a day.  Use MiraLax (over the counter) for constipation as needed.   Diet - low sodium heart healthy   Complete by: As directed    Discharge instructions   Complete by: As directed    Dr. [name] [facility] [address], [place], [place] [address] [phone]  TOTAL KNEE REPLACEMENT POSTOPERATIVE DIRECTIONS  Knee Rehabilitation, Guidelines Following Surgery  Results after knee surgery are often greatly improved when you follow the exercise, range of motion and muscle strengthening exercises prescribed by your doctor. Safety measures are also important to protect the knee from further injury. Any time any of these exercises cause you to have increased pain or swelling in your knee joint, decrease the amount until you are comfortable again and slowly increase them. If you have problems or questions, call your caregiver or  physical therapist for advice.   HOME CARE INSTRUCTIONS  Remove items at home which could result in a fall. This includes throw rugs or furniture in walking pathways.  ICE to the affected knee every three hours for 30 minutes at a time and then as needed for pain and swelling.  Continue to use ice on the knee for pain and swelling from surgery. You may notice swelling that will progress down to the foot and ankle.  This is normal after surgery.  Elevate the leg when you are not up walking on it.   Continue to use the breathing machine which will help keep your temperature down.  It is common for your temperature to cycle up and down following surgery, especially at night when you are not up moving around and exerting yourself.  The breathing machine keeps your lungs expanded and your temperature down. Do not place pillow under knee, focus on keeping the knee straight while resting   DIET You may resume your previous home diet once your are discharged from the hospital.  DRESSING / WOUND CARE / SHOWERING You may shower 3 days after surgery, but keep the wounds dry during showering.  You may use an occlusive plastic wrap (Press'n Seal for example), NO SOAKING/SUBMERGING IN THE BATHTUB.  If  the bandage gets wet, change with a clean dry gauze.  If the incision gets wet, pat the wound dry with a clean towel. You may start showering once you are discharged home but do not submerge the incision under water. Just pat the incision dry and apply a dry gauze dressing on daily. Change the surgical dressing daily and reapply a dry dressing each time.  ACTIVITY Walk with your walker as instructed. Use walker as long as suggested by your caregivers. Avoid periods of inactivity such as sitting longer than an hour when not asleep. This helps prevent blood clots.  You may resume a sexual relationship in one month or when given the OK by your doctor.  You may return to work once you are cleared by your doctor.    Do not drive a car for 6 weeks or until released by you surgeon.  Do not drive while taking narcotics.  WEIGHT BEARING Weight bearing as tolerated with assist device (walker, cane, etc) as directed, use it as long as suggested by your surgeon or therapist, typically at least 4-6 weeks.  POSTOPERATIVE CONSTIPATION PROTOCOL Constipation - defined medically as fewer than three stools per week and severe constipation as less than one stool per week.  One of the most common issues patients have following surgery is constipation.  Even if you have a regular bowel pattern at home, your normal regimen is likely to be disrupted due to multiple reasons following surgery.  Combination of anesthesia, postoperative narcotics, change in appetite and fluid intake all can affect your bowels.  In order to avoid complications following surgery, here are some recommendations in order to help you during your recovery period.  Colace (docusate) - Pick up an over-the-counter form of Colace or another stool softener and take twice a day as long as you are requiring postoperative pain medications.  Take with a full glass of water daily.  If you experience loose stools or diarrhea, hold the colace until you stool forms back up.  If your symptoms do not get better within 1 week or if they get worse, check with your doctor.  Dulcolax (bisacodyl) - Pick up over-the-counter and take as directed by the product packaging as needed to assist with the movement of your bowels.  Take with a full glass of water.  Use this product as needed if not relieved by Colace only.   MiraLax (polyethylene glycol) - Pick up over-the-counter to have on hand.  MiraLax is a solution that will increase the amount of water in your bowels to assist with bowel movements.  Take as directed and can mix with a glass of water, juice, soda, coffee, or tea.  Take if you go more than two days without a movement. Do not use MiraLax more than once per day. Call  your doctor if you are still constipated or irregular after using this medication for 7 days in a row.  If you continue to have problems with postoperative constipation, please contact the office for further assistance and recommendations.  If you experience "the worst abdominal pain ever" or develop nausea or vomiting, please contact the office immediatly for further recommendations for treatment.  ITCHING  If you experience itching with your medications, try taking only a single pain pill, or even half a pain pill at a time.  You can also use Benadryl over the counter for itching or also to help with sleep.   TED HOSE STOCKINGS Wear the elastic stockings on both legs for  three weeks following surgery during the day but you may remove then at night for sleeping.  MEDICATIONS See your medication summary on the "After Visit Summary" that the nursing staff will review with you prior to discharge.  You may have some home medications which will be placed on hold until you complete the course of blood thinner medication.  It is important for you to complete the blood thinner medication as prescribed by your surgeon.  Continue your approved medications as instructed at time of discharge.  PRECAUTIONS If you experience chest pain or shortness of breath - call 911 immediately for transfer to the hospital emergency department.  If you develop a fever greater that 101 F, purulent drainage from wound, increased redness or drainage from wound, foul odor from the wound/dressing, or calf pain - CONTACT YOUR SURGEON.                                                   FOLLOW-UP APPOINTMENTS Make sure you keep all of your appointments after your operation with your surgeon and caregivers. You should call the office at the above phone number and make an appointment for approximately two weeks after the date of your surgery or on the date instructed by your surgeon outlined in the "After Visit Summary".   RANGE OF  MOTION AND STRENGTHENING EXERCISES  Rehabilitation of the knee is important following a knee injury or an operation. After just a few days of immobilization, the muscles of the thigh which control the knee become weakened and shrink (atrophy). Knee exercises are designed to build up the tone and strength of the thigh muscles and to improve knee motion. Often times heat used for twenty to thirty minutes before working out will loosen up your tissues and help with improving the range of motion but do not use heat for the first two weeks following surgery. These exercises can be done on a training (exercise) mat, on the floor, on a table or on a bed. Use what ever works the best and is most comfortable for you Knee exercises include:  Leg Lifts - While your knee is still immobilized in a splint or cast, you can do straight leg raises. Lift the leg to 60 degrees, hold for 3 sec, and slowly lower the leg. Repeat 10-20 times 2-3 times daily. Perform this exercise against resistance later as your knee gets better.  Quad and Hamstring Sets - Tighten up the muscle on the front of the thigh (Quad) and hold for 5-10 sec. Repeat this 10-20 times hourly. Hamstring sets are done by pushing the foot backward against an object and holding for 5-10 sec. Repeat as with quad sets.  Leg Slides: Lying on your back, slowly slide your foot toward your buttocks, bending your knee up off the floor (only go as far as is comfortable). Then slowly slide your foot back down until your leg is flat on the floor again. Angel Wings: Lying on your back spread your legs to the side as far apart as you can without causing discomfort.  A rehabilitation program following serious knee injuries can speed recovery and prevent re-injury in the future due to weakened muscles. Contact your doctor or a physical therapist for more information on knee rehabilitation.   IF YOU ARE TRANSFERRED TO A SKILLED REHAB FACILITY If the patient is  transferred to a  skilled rehab facility following release from the hospital, a list of the current medications will be sent to the facility for the patient to continue.  When discharged from the skilled rehab facility, please have the facility set up the patient's [place] prior to being released. Also, the skilled facility will be responsible for providing the patient with their medications at time of release from the facility to include their pain medication, the muscle relaxants, and their blood thinner medication. If the patient is still at the rehab facility at time of the two week follow up appointment, the skilled rehab facility will also need to assist the patient in arranging follow up appointment in our office and any transportation needs.  MAKE SURE YOU:  Understand these instructions.  Get help right away if you are not doing well or get worse.    Pick up stool softner and laxative for home use following surgery while on pain medications. Do not submerge incision under water. Please use good hand washing techniques while changing dressing each day. May shower starting three days after surgery. Please use a clean towel to pat the incision dry following showers. Continue to use ice for pain and swelling after surgery. Do not use any lotions or creams on the incision until instructed by your surgeon.   Increase activity slowly as tolerated   Complete by: As directed      Allergies as of 08/26/2018      Reactions   Shellfish Allergy Hives   Strawberry Extract Hives      Medication List    STOP taking these medications   meloxicam 15 MG tablet Commonly known as: MOBIC     TAKE these medications   acetaminophen 325 MG tablet Commonly known as: TYLENOL Take 650 mg by mouth every 6 (six) hours as needed.   aspirin EC 325 MG tablet Take 1 tablet (325 mg total) by mouth 2 (two) times a day.   atorvastatin 20 MG tablet Commonly known as: LIPITOR TAKE 1 TABLET BY MOUTH ONCE  DAILY What changed: when to take this   famotidine 10 MG tablet Commonly known as: PEPCID Take 10 mg by mouth at bedtime.   lisinopril-hydrochlorothiazide 20-12.5 MG tablet Commonly known as: ZESTORETIC TAKE 1 TABLET BY MOUTH DAILY   loratadine 10 MG tablet Commonly known as: CLARITIN Take 10 mg by mouth daily as needed for allergies.   methocarbamol 500 MG tablet Commonly known as: ROBAXIN Take 1 tablet (500 mg total) by mouth every 6 (six) hours as needed for muscle spasms.   oxyCODONE 5 MG immediate release tablet Commonly known as: Oxy IR/ROXICODONE Take 1-2 tablets (5-10 mg total) by mouth every 6 (six) hours as needed for moderate pain.   Vitamin D-3 25 MCG (1000 UT) Caps Take 1,000 Units by mouth daily.      Follow-up Information    [name], MD. Schedule an appointment as soon as possible for a visit on 09/06/2018.   Specialty: Orthopedic Surgery Contact information: [address] [place] [address] [phone]           Signed: Ardeen Jourdain, PA-C Orthopaedic Surgery 08/29/2018, 7:06 AM

## 2018-08-31 DIAGNOSIS — M25662 Stiffness of left knee, not elsewhere classified: Secondary | ICD-10-CM | POA: Diagnosis not present

## 2018-09-01 DIAGNOSIS — Z4733 Aftercare following explantation of knee joint prosthesis: Secondary | ICD-10-CM | POA: Insufficient documentation

## 2018-09-01 DIAGNOSIS — Z96611 Presence of right artificial shoulder joint: Secondary | ICD-10-CM | POA: Insufficient documentation

## 2018-09-02 DIAGNOSIS — M25662 Stiffness of left knee, not elsewhere classified: Secondary | ICD-10-CM | POA: Diagnosis not present

## 2018-09-05 DIAGNOSIS — M25662 Stiffness of left knee, not elsewhere classified: Secondary | ICD-10-CM | POA: Diagnosis not present

## 2018-09-06 DIAGNOSIS — Z4733 Aftercare following explantation of knee joint prosthesis: Secondary | ICD-10-CM | POA: Diagnosis not present

## 2018-09-07 DIAGNOSIS — M25662 Stiffness of left knee, not elsewhere classified: Secondary | ICD-10-CM | POA: Diagnosis not present

## 2018-09-09 DIAGNOSIS — M25662 Stiffness of left knee, not elsewhere classified: Secondary | ICD-10-CM | POA: Diagnosis not present

## 2018-09-12 DIAGNOSIS — M25662 Stiffness of left knee, not elsewhere classified: Secondary | ICD-10-CM | POA: Diagnosis not present

## 2018-09-14 ENCOUNTER — Other Ambulatory Visit: Payer: Self-pay | Admitting: Family Medicine

## 2018-09-14 DIAGNOSIS — M25662 Stiffness of left knee, not elsewhere classified: Secondary | ICD-10-CM | POA: Diagnosis not present

## 2018-09-14 NOTE — Telephone Encounter (Signed)
Requested medication (s) are due for refill today:  yes  Requested medication (s) are on the active medication list:  yes  Future visit scheduled:  Yes; has CPE with Dr. Nolon Rod 10/31/18 (has not seen Dr. Nolon Rod before)   Last Refill: 09/14/17; #90; RF x3  Requested Prescriptions  Pending Prescriptions Disp Refills   atorvastatin (LIPITOR) 20 MG tablet 90 tablet 3    Sig: Take 1 tablet (20 mg total) by mouth daily.     Cardiovascular:  Antilipid - Statins Failed - 09/14/2018 12:11 PM      Failed - Triglycerides in normal range and within 360 days    Triglycerides  Date Value Ref Range Status  08/10/2018 203 (H) 0 - 149 mg/dL Final         Passed - Total Cholesterol in normal range and within 360 days    Cholesterol, Total  Date Value Ref Range Status  08/10/2018 188 100 - 199 mg/dL Final         Passed - LDL in normal range and within 360 days    LDL Calculated  Date Value Ref Range Status  08/10/2018 85 0 - 99 mg/dL Final         Passed - HDL in normal range and within 360 days    HDL  Date Value Ref Range Status  08/10/2018 62 >39 mg/dL Final         Passed - Patient is not pregnant      Passed - Valid encounter within last 12 months    Recent Outpatient Visits          1 month ago Pre-op testing   Primary Care at [facility], Arlie Solomons, MD   11 months ago Encounter for [employer] annual wellness exam   Primary Care at [facility], Gelene Mink, PA-C   1 year ago Osteoarthritis of spine with radiculopathy, lumbar region   Primary Care at [place], Ines Bloomer, MD   1 year ago Sciatica of right side   Primary Care at [facility], Ines Bloomer, MD   1 year ago Sore throat   Primary Care at [facility], Ranell Patrick, MD      Future Appointments            In 1 month Forrest Moron, MD Primary Care at [place], [facility]

## 2018-09-14 NOTE — Telephone Encounter (Signed)
Medication Refill - Medication: atorvastatin (LIPITOR) 20 MG tablet   Has the patient contacted their pharmacy? Yes.   (Agent: If no, request that the patient contact the pharmacy for the refill.) (Agent: If yes, when and what did the pharmacy advise?)  Preferred Pharmacy (with phone number or street name):  [place], [place] - [address]  [address] [place] [address]  Phone: [phone] Fax: [phone]     Agent: Please be advised that RX refills may take up to 3 business days. We ask that you follow-up with your pharmacy.

## 2018-09-15 DIAGNOSIS — Z961 Presence of intraocular lens: Secondary | ICD-10-CM | POA: Diagnosis not present

## 2018-09-15 DIAGNOSIS — H43393 Other vitreous opacities, bilateral: Secondary | ICD-10-CM | POA: Diagnosis not present

## 2018-09-15 DIAGNOSIS — H35372 Puckering of macula, left eye: Secondary | ICD-10-CM | POA: Diagnosis not present

## 2018-09-15 DIAGNOSIS — H2511 Age-related nuclear cataract, right eye: Secondary | ICD-10-CM | POA: Diagnosis not present

## 2018-09-15 MED ORDER — ATORVASTATIN CALCIUM 20 MG PO TABS: 20.0000 mg | ORAL_TABLET | Freq: Every day | ORAL | 0 refills | Status: DC

## 2018-09-19 DIAGNOSIS — M25662 Stiffness of left knee, not elsewhere classified: Secondary | ICD-10-CM | POA: Diagnosis not present

## 2018-09-21 DIAGNOSIS — M25662 Stiffness of left knee, not elsewhere classified: Secondary | ICD-10-CM | POA: Diagnosis not present

## 2018-09-28 DIAGNOSIS — M25662 Stiffness of left knee, not elsewhere classified: Secondary | ICD-10-CM | POA: Diagnosis not present

## 2018-09-30 DIAGNOSIS — M25662 Stiffness of left knee, not elsewhere classified: Secondary | ICD-10-CM | POA: Diagnosis not present

## 2018-10-05 DIAGNOSIS — M25662 Stiffness of left knee, not elsewhere classified: Secondary | ICD-10-CM | POA: Diagnosis not present

## 2018-10-07 DIAGNOSIS — M25662 Stiffness of left knee, not elsewhere classified: Secondary | ICD-10-CM | POA: Diagnosis not present

## 2018-10-10 DIAGNOSIS — M25662 Stiffness of left knee, not elsewhere classified: Secondary | ICD-10-CM | POA: Diagnosis not present

## 2018-10-12 DIAGNOSIS — M25662 Stiffness of left knee, not elsewhere classified: Secondary | ICD-10-CM | POA: Diagnosis not present

## 2018-10-14 DIAGNOSIS — M25662 Stiffness of left knee, not elsewhere classified: Secondary | ICD-10-CM | POA: Diagnosis not present

## 2018-10-17 DIAGNOSIS — M25662 Stiffness of left knee, not elsewhere classified: Secondary | ICD-10-CM | POA: Diagnosis not present

## 2018-10-19 DIAGNOSIS — M25662 Stiffness of left knee, not elsewhere classified: Secondary | ICD-10-CM | POA: Diagnosis not present

## 2018-10-25 DIAGNOSIS — M25662 Stiffness of left knee, not elsewhere classified: Secondary | ICD-10-CM | POA: Diagnosis not present

## 2018-10-27 ENCOUNTER — Ambulatory Visit: Payer: Medicare Other | Admitting: Family Medicine

## 2018-10-31 ENCOUNTER — Encounter: Payer: Self-pay | Admitting: Family Medicine

## 2018-10-31 ENCOUNTER — Ambulatory Visit (INDEPENDENT_AMBULATORY_CARE_PROVIDER_SITE_OTHER): Payer: Medicare Other | Admitting: Family Medicine

## 2018-10-31 ENCOUNTER — Other Ambulatory Visit: Payer: Self-pay

## 2018-10-31 VITALS — BP 108/74 | HR 94 | Temp 98.6°F | Resp 17 | Ht 63.0 in | Wt 200.8 lb

## 2018-10-31 DIAGNOSIS — I119 Hypertensive heart disease without heart failure: Secondary | ICD-10-CM

## 2018-10-31 DIAGNOSIS — Z0001 Encounter for general adult medical examination with abnormal findings: Secondary | ICD-10-CM | POA: Diagnosis not present

## 2018-10-31 DIAGNOSIS — Z0000 Encounter for general adult medical examination without abnormal findings: Secondary | ICD-10-CM

## 2018-10-31 NOTE — Patient Instructions (Addendum)
  Continue to bend the knee and improve your range of motion.  Nice and steady.    If you have lab work done today you will be contacted with your lab results within the next 2 weeks.  If you have not heard from [place] then please contact us. The fastest way to get your results is to register for My Chart.   IF you received an x-ray today, you will receive an invoice from [facility] Rad[facility]. Please contact [facility] Radiology at [phone] with questions or concerns regarding your invoice.   IF you received labwork today, you will receive an invoice from [place]. Please contact [facility] at [phone] with questions or concerns regarding your invoice.   Our billing staff will not be able to assist you with questions regarding bills from these companies.  You will be contacted with the lab results as soon as they are available. The fastest way to get your results is to activate your My Chart account. Instructions are located on the last page of this paperwork. If you have not heard from [place] regarding the results in 2 weeks, please contact this office.

## 2018-10-31 NOTE — Progress Notes (Signed)
QUICK REFERENCE INFORMATION: The ABCs of Providing the Annual Wellness Visit  CMS.gov Medicare Learning Network  [employer] Annual Wellness Visit  Subjective:   Lori Mooney is a 73 y.o. Female who presents for an Annual Wellness Visit.  The 10-year ASCVD risk score Mikey Bussing DC Brooke Bonito., et al., 2013) is: 11%   Values used to calculate the score:     Age: 56 years     Sex: Female     Is Non-Hispanic African American: No     Diabetic: No     Tobacco smoker: No     Systolic Blood Pressure: 123XX123 mmHg     Is BP treated: Yes     HDL Cholesterol: 62 mg/dL     Total Cholesterol: 188 mg/dL   Patient Active Problem List   Diagnosis Date Noted  . H/O total knee replacement, left 08/24/2018  . Osteoarthritis of spine with radiculopathy, lumbar region 06/25/2017  . Lumbar pain 06/25/2017  . Spinal stenosis of lumbar region without neurogenic claudication 06/25/2017  . Sciatica of right side 06/19/2017  . GERD (gastroesophageal reflux disease) 09/19/2013  . Vertigo 03/02/2012  . Hypercholesterolemia 10/08/2010  . Benign hypertensive heart disease without heart failure 10/08/2010  . Obesity 10/08/2010    Past Medical History:  Diagnosis Date  . Allergy   . 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 Cataract   . Exogenous obesity   . GERD (gastroesophageal reflux disease)   . Hyperlipidemia    hypercholesterolemia  . Hypertension      Past Surgical History:  Procedure Laterality Date  . CATARACT EXTRACTION W/ INTRAOCULAR LENS IMPLANT Left   . DILITATION & CURRETTAGE/HYSTROSCOPY WITH VERSAPOINT RESECTION N/A 08/25/2012   Procedure: DILATATION & CURETTAGE/HYSTEROSCOPY WITH VERSAPOINT RESECTION;  Surgeon: Princess Bruins, MD;  Location: [facility];  Service: Gynecology;  Laterality: N/A;  . EYE SURGERY    . TONSILLECTOMY    . 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   Procedure: TOTAL KNEE ARTHROPLASTY;  Surgeon: [name], MD;  Location: [facility];  Service: Orthopedics;   Laterality: Left;  130min  . TUBAL LIGATION       Outpatient Medications Prior to Visit  Medication Sig Dispense Refill  . acetaminophen (TYLENOL) 325 MG tablet Take 650 mg by mouth every 6 (six) hours as needed.    [name] atorvastatin (LIPITOR) 20 MG tablet Take 1 tablet (20 mg total) by mouth daily. 90 tablet 0  . Cholecalciferol (VITAMIN D-3) 1000 UNITS CAPS Take 1,000 Units by mouth daily.    . famotidine (PEPCID) 10 MG tablet Take 10 mg by mouth at bedtime.     [name] lisinopril-hydrochlorothiazide (PRINZIDE,ZESTORETIC) 20-12.5 MG tablet TAKE 1 TABLET BY MOUTH DAILY 90 tablet 3  . loratadine (CLARITIN) 10 MG tablet Take 10 mg by mouth daily as needed for allergies.     [name] aspirin EC 325 MG tablet Take 1 tablet (325 mg total) by mouth 2 (two) times a day. 40 tablet 0  . methocarbamol (ROBAXIN) 500 MG tablet Take 1 tablet (500 mg total) by mouth every 6 (six) hours as needed for muscle spasms. 40 tablet 1  . oxyCODONE (OXY IR/ROXICODONE) 5 MG immediate release tablet Take 1-2 tablets (5-10 mg total) by mouth every 6 (six) hours as needed for moderate pain. 56 tablet 0   No facility-administered medications prior to visit.     Allergies  Allergen Reactions  . Shellfish Allergy Hives  . Strawberry Extract Hives     Family History  Problem Relation Age of Onset  . Hyperlipidemia Mother   . Heart disease Mother   . Hypertension Mother   . Hypertension Father   . Hyperlipidemia Father   . Hyperlipidemia Sister   . Hypertension Sister      Social History   Socioeconomic History  . Marital status: Married    Spouse name: Not on file  . Number of children: Not on file  . Years of education: Not on file  . Highest education level: Not on file  Occupational History  . Occupation: Retired Animal nutritionist  . Financial resource strain: Not on file  . Food insecurity    Worry: Not on file    Inability: Not on file  . Transportation needs    Medical: Not on file    Non-medical: Not  on file  Tobacco Use  . Smoking status: Never Smoker  . Smokeless tobacco: Never Used  Substance and Sexual Activity  . Alcohol use: Yes    Alcohol/week: 1.0 - 2.0 standard drinks    Types: 1 - 2 Standard drinks or equivalent per week  . Drug use: No  . Sexual activity: Yes  Lifestyle  . Physical activity    Days per week: Not on file    Minutes per session: Not on file  . Stress: Not on file  Relationships  . Social Herbalist on phone: Not on file    Gets together: Not on file    Attends religious service: Not on file    Active member of club or organization: Not on file    Attends meetings of clubs or organizations: Not on file    Relationship status: Not on file  Other Topics Concern  . Not on file  Social History Narrative   Married. Education: [employer].      Recent Hospitalizations? No  Health Habits: Current exercise activities include: physical therapy s/p knee replacemet on the left Exercise: 3 times/week. Diet: in general, a "healthy" diet    Alcohol intake:   Health Risk Assessment: The patient has completed a Health Risk Assessment. This has been reveiwed with them and has been scanned into the [facility] system as an attached document.  Current Medical Providers and Suppliers: [facility] Patient Care Team: Forrest Moron, MD as PCP - General (Internal Medicine) Rutherford Guys, MD as Consulting Physician (Ophthalmology) Jerline Pain, MD as Consulting Physician (Cardiology) Ronald Lobo, MD as Consulting Physician (Gastroenterology) Particia Nearing, MD as Attending Physician (Dermatology) No future appointments.   Age-appropriate Screening Schedule: The list below includes current immunization status and future screening recommendations based on patient's age. Orders for these recommended tests are listed in the plan section. The patient has been provided with a written plan. Immunization History  Administered Date(s) Administered  . Influenza  Nasal 10/16/2017  . Influenza Whole 11/07/2011  . Influenza,inj,Quad PF,6+ Mos 10/28/2012  . Influenza-Unspecified 10/26/2013, 10/15/2014, 11/10/2015, 10/19/2016  . Pneumococcal Conjugate-13 10/30/2014  . Pneumococcal Polysaccharide-23 07/20/2016  . Td 03/20/2009    Health Maintenance reviewed -  urine microalbumin ordered   Depression Screen-PHQ2/9 completed today  Depression screen [facility] 2/9 10/31/2018 08/10/2018 10/19/2017 06/25/2017 06/19/2017  Decreased Interest 0 0 0 0 0  Down, Depressed, Hopeless 0 0 0 0 0  PHQ - 2 Score 0 0 0 0 0       Depression Severity and Treatment Recommendations:  0-4= None  5-9= Mild / Treatment: Support, educate to call if worse; return in one month  10-14= Moderate / Treatment: Support, watchful waiting; Antidepressant or Psycotherapy  15-19= Moderately severe / Treatment: Antidepressant OR Psychotherapy  >= 20 = Major depression, severe / Antidepressant AND Psychotherapy  Functional Status Survey:   Is the patient deaf or have difficulty hearing?: No Does the patient have difficulty seeing, even when wearing glasses/contacts?: No Does the patient have difficulty concentrating, remembering, or making decisions?: No Does the patient have difficulty walking or climbing stairs?: Yes(stiff leg after knee replacement- left knee total) Does the patient have difficulty dressing or bathing?: No Does the patient have difficulty doing errands alone such as visiting a doctor's office or shopping?: No    Advanced Care Planning: 1. Patient has executed an Advance Directive: Yes 2. If no, patient was given the opportunity to execute an Advance Directive today? n/a 3. Are the patient's advanced directives in [place]? No 4. This patient has the ability to prepare an Advance Directive: Yes 5. Provider is willing to follow the patient's wishes: Yes  Cognitive Assessment: Does the patient have evidence of cognitive impairment? No The patient does not have any  evidence of any cognitive problems and denies any  change in mood/affect, appearance, speech, memory or motor skills.  Identification of Risk Factors: Risk factors include: family history  Review of Systems  Constitutional: Negative for chills and fever.  Eyes: Negative for blurred vision and double vision.  Respiratory: Negative for cough, shortness of breath and wheezing.   Cardiovascular: Negative for chest pain, palpitations and orthopnea.  Gastrointestinal: Positive for constipation. Negative for abdominal pain, blood in stool, diarrhea, nausea and vomiting.  Musculoskeletal: Negative for back pain, joint pain, myalgias and neck pain.  Skin: Negative for itching and rash.  Neurological: Negative for dizziness and headaches.  Psychiatric/Behavioral: Negative for depression. The patient is not nervous/anxious.      Objective:   Vitals:   10/31/18 0805  BP: 108/74  Pulse: 94  Resp: 17  Temp: 98.6 F (37 C)  TempSrc: Oral  SpO2: 98%  Weight: 200 lb 12.8 oz (91.1 kg)  Height: 5\' 3"  (1.6 m)   Wt Readings from Last 3 Encounters:  10/31/18 200 lb 12.8 oz (91.1 kg)  08/24/18 203 lb (92.1 kg)  08/23/18 203 lb (92.1 kg)     Body mass index is 35.57 kg/m.  BP 108/74 (BP Location: Left Arm, Patient Position: Sitting, Cuff Size: Large)   Pulse 94   Temp 98.6 F (37 C) (Oral)   Resp 17   Ht 5\' 3"  (1.6 m)   Wt 200 lb 12.8 oz (91.1 kg)   SpO2 98%   BMI 35.57 kg/m   General Appearance:    Alert, cooperative, no distress, appears stated age  Head:    Normocephalic, without obvious abnormality, atraumatic  Eyes:    PERRL, conjunctiva/corneas clear, EOM's intact  Ears:    Normal TM's and external ear canals, both ears  Nose:   Nares normal, septum midline, mucosa normal, no drainage    or sinus tenderness  Throat:   Lips, mucosa, and tongue normal; teeth and gums normal  Neck:   Supple, symmetrical, trachea midline, no adenopathy;    thyroid:  no  enlargement/tenderness/nodules; no carotid   bruit or JVD  Back:     Symmetric, no curvature, ROM normal, no CVA tenderness  Lungs:     Clear to auscultation bilaterally, respirations unlabored  Chest Wall:    No tenderness or deformity   Heart:    Regular rate and rhythm, S1 and  S2 normal, no murmur, rub   or gallop  Breast Exam:    No tenderness, masses, or nipple abnormality  Abdomen:     Soft, non-tender, bowel sounds active all four quadrants,    no masses, no organomegaly  Extremities:   Extremities normal, atraumatic, no cyanosis or edema  Pulses:   2+ and symmetric all extremities  Skin:   Skin color, texture, turgor normal, no rashes or lesions  Lymph nodes:   Cervical, supraclavicular, and axillary nodes normal  Neurologic:   CNII-XII intact, normal strength, sensation and reflexes    throughout      Assessment/Plan:   Patient Self-Management and Personalized Health Advice The patient has been provided with information about:  begin progressive daily aerobic exercise program  During the course of the visit the patient was educated and counseled about appropriate screening and preventive services including:   return annually or prn    Body mass index is 35.57 kg/m. Discussed the patient's BMI with her. The BMI BMI is not in the acceptable range; BMI management plan is completed  [place] was seen today for annual exam.  Diagnoses and all orders for this visit:  Encounter for Medicare annual wellness exam - Women's Health Maintenance Plan Advised monthly breast exam and annual mammogram Advised dental exam every six months Discussed stress management Discussed pap smear screening guidelines that after 65 paps are no longer recommended unless there are symptoms    Benign hypertensive heart disease without heart failure -     Microalbumin, urine      Return for 6 months for hypertension, Endo of February for nurse visit for tdap.  No future  appointments.  Patient Instructions    Continue to bend the knee and improve your range of motion.  Nice and steady.    If you have lab work done today you will be contacted with your lab results within the next 2 weeks.  If you have not heard from [place] then please contact us. The fastest way to get your results is to register for My Chart.   IF you received an x-ray today, you will receive an invoice from [facility] Radiology. Please contact [facility] Radiology at [phone] with questions or concerns regarding your invoice.   IF you received labwork today, you will receive an invoice from [place]. Please contact [facility] at [phone] with questions or concerns regarding your invoice.   Our billing staff will not be able to assist you with questions regarding bills from these companies.  You will be contacted with the lab results as soon as they are available. The fastest way to get your results is to activate your My Chart account. Instructions are located on the last page of this paperwork. If you have not heard from [place] regarding the results in 2 weeks, please contact this office.       An after visit summary with all of these plans was given to the patient.

## 2018-11-01 LAB — MICROALBUMIN, URINE: Microalbumin, Urine: 7.3 ug/mL

## 2018-11-03 ENCOUNTER — Other Ambulatory Visit: Payer: Self-pay | Admitting: Family Medicine

## 2018-11-03 NOTE — Telephone Encounter (Signed)
Medication: lisinopril-hydrochlorothiazide (PRINZIDE,ZESTORETIC) 20-12.5 MG tablet [id number] day prescribtion  Has the patient contacted their pharmacy? Yes  (Agent: If no, request that the patient contact the pharmacy for the refill.) (Agent: If yes, when and what did the pharmacy advise?)  Preferred Pharmacy (with phone number or street name): [place], [place] - [id number] [phone] (Phone) [phone] (Fax)    Agent: Please be advised that RX refills may take up to 3 business days. We ask that you follow-up with your pharmacy.

## 2018-11-03 NOTE — Telephone Encounter (Signed)
Prescription for lisinopril - hydrochlorothiazide  20-12.5 mg expired on 09/14/18.

## 2018-11-04 MED ORDER — LISINOPRIL-HYDROCHLOROTHIAZIDE 20-12.5 MG PO TABS: 1.0000 | ORAL_TABLET | Freq: Every day | ORAL | 0 refills | Status: DC

## 2018-11-04 NOTE — Telephone Encounter (Signed)
Courtesy refill has been sent for 30 day no further refills without office

## 2018-11-15 DIAGNOSIS — M25662 Stiffness of left knee, not elsewhere classified: Secondary | ICD-10-CM | POA: Diagnosis not present

## 2018-11-18 DIAGNOSIS — Z4733 Aftercare following explantation of knee joint prosthesis: Secondary | ICD-10-CM | POA: Diagnosis not present

## 2018-11-18 DIAGNOSIS — M1711 Unilateral primary osteoarthritis, right knee: Secondary | ICD-10-CM | POA: Diagnosis not present

## 2018-12-05 ENCOUNTER — Telehealth: Payer: Self-pay | Admitting: Family Medicine

## 2018-12-05 NOTE — Telephone Encounter (Signed)
Copied from [place] #[id number]. Topic: Quick Communication - Rx Refill/Question >> Dec 05, 2018  8:27 AM Carolyn Stare wrote: Medication lisinopril-hydrochlorothiazide (ZESTORETIC) 20-12.5 MG tablet     pt req 90 day supply   Preferred Pharmacy  [facility]   Agent: Please be advised that RX refills may take up to 3 business days. We ask that you follow-up with your pharmacy.

## 2018-12-06 ENCOUNTER — Other Ambulatory Visit: Payer: Self-pay | Admitting: *Deleted

## 2018-12-06 MED ORDER — LISINOPRIL-HYDROCHLOROTHIAZIDE 20-12.5 MG PO TABS: 1.0000 | ORAL_TABLET | Freq: Every day | ORAL | 1 refills | Status: DC

## 2018-12-06 MED ORDER — ATORVASTATIN CALCIUM 20 MG PO TABS: 20.0000 mg | ORAL_TABLET | Freq: Every day | ORAL | 0 refills | Status: DC

## 2019-01-13 DIAGNOSIS — Z96652 Presence of left artificial knee joint: Secondary | ICD-10-CM | POA: Diagnosis not present

## 2019-01-13 DIAGNOSIS — Z96659 Presence of unspecified artificial knee joint: Secondary | ICD-10-CM | POA: Diagnosis not present

## 2019-01-13 DIAGNOSIS — Z4733 Aftercare following explantation of knee joint prosthesis: Secondary | ICD-10-CM | POA: Diagnosis not present

## 2019-02-24 ENCOUNTER — Ambulatory Visit: Payer: Medicare Other

## 2019-03-03 ENCOUNTER — Other Ambulatory Visit: Payer: Self-pay | Admitting: Family Medicine

## 2019-03-04 ENCOUNTER — Ambulatory Visit: Payer: Medicare Other | Attending: Internal Medicine

## 2019-03-04 DIAGNOSIS — Z23 Encounter for immunization: Secondary | ICD-10-CM

## 2019-03-04 NOTE — Progress Notes (Signed)
   Covid-19 Vaccination Clinic  Name:  Lori Mooney    MRN: [id number] DOB: 1945-03-03  03/04/2019  [name] was observed post Covid-19 immunization for 15 minutes without incidence. She was provided with Vaccine Information Sheet and instruction to access the V-Safe system.   [name] was instructed to call 911 with any severe reactions post vaccine: [name] Difficulty breathing  . Swelling of your face and throat  . A fast heartbeat  . A bad rash all over your body  . Dizziness and weakness    Immunizations Administered    Name Date Dose VIS Date Route   Pfizer COVID-19 Vaccine 03/04/2019  4:23 PM 0.3 mL 01/06/2019 Intramuscular   Manufacturer: [place]   Lot: [id number]   [place]: [id number]

## 2019-03-15 DIAGNOSIS — D220 Melanocytic nevi of lip: Secondary | ICD-10-CM | POA: Diagnosis not present

## 2019-03-15 DIAGNOSIS — L821 Other seborrheic keratosis: Secondary | ICD-10-CM | POA: Diagnosis not present

## 2019-03-15 DIAGNOSIS — D044 Carcinoma in situ of skin of scalp and neck: Secondary | ICD-10-CM | POA: Diagnosis not present

## 2019-03-15 DIAGNOSIS — L570 Actinic keratosis: Secondary | ICD-10-CM | POA: Diagnosis not present

## 2019-03-15 DIAGNOSIS — L918 Other hypertrophic disorders of the skin: Secondary | ICD-10-CM | POA: Diagnosis not present

## 2019-03-15 DIAGNOSIS — L814 Other melanin hyperpigmentation: Secondary | ICD-10-CM | POA: Diagnosis not present

## 2019-03-15 DIAGNOSIS — D485 Neoplasm of uncertain behavior of skin: Secondary | ICD-10-CM | POA: Diagnosis not present

## 2019-03-15 DIAGNOSIS — Z85828 Personal history of other malignant neoplasm of skin: Secondary | ICD-10-CM | POA: Diagnosis not present

## 2019-03-15 DIAGNOSIS — D1801 Hemangioma of skin and subcutaneous tissue: Secondary | ICD-10-CM | POA: Diagnosis not present

## 2019-03-15 DIAGNOSIS — D2262 Melanocytic nevi of left upper limb, including shoulder: Secondary | ICD-10-CM | POA: Diagnosis not present

## 2019-03-17 ENCOUNTER — Ambulatory Visit: Payer: Medicare Other

## 2019-03-24 DIAGNOSIS — D044 Carcinoma in situ of skin of scalp and neck: Secondary | ICD-10-CM | POA: Diagnosis not present

## 2019-03-29 ENCOUNTER — Ambulatory Visit: Payer: Medicare Other | Attending: Internal Medicine

## 2019-03-29 DIAGNOSIS — Z23 Encounter for immunization: Secondary | ICD-10-CM | POA: Insufficient documentation

## 2019-03-29 NOTE — Progress Notes (Signed)
   Covid-19 Vaccination Clinic  Name:  Lori Mooney    MRN: [id number] DOB: 06/13/1945  03/29/2019  Ms. Lori Mooney was observed post Covid-19 immunization for 15 minutes without incident. She was provided with Vaccine Information Sheet and instruction to access the V-Safe system.   Ms. Lori Mooney was instructed to call 911 with any severe reactions post vaccine: [name] Difficulty breathing  . Swelling of face and throat  . A fast heartbeat  . A bad rash all over body  . Dizziness and weakness   Immunizations Administered    Name Date Dose VIS Date Route   Pfizer COVID-19 Vaccine 03/29/2019 12:25 PM 0.3 mL 01/06/2019 Intramuscular   Manufacturer: [place]   Lot: [id number]   [place]: [id number]

## 2019-04-07 DIAGNOSIS — H2511 Age-related nuclear cataract, right eye: Secondary | ICD-10-CM | POA: Diagnosis not present

## 2019-04-07 DIAGNOSIS — H25011 Cortical age-related cataract, right eye: Secondary | ICD-10-CM | POA: Diagnosis not present

## 2019-04-07 DIAGNOSIS — H35372 Puckering of macula, left eye: Secondary | ICD-10-CM | POA: Diagnosis not present

## 2019-04-07 DIAGNOSIS — Z961 Presence of intraocular lens: Secondary | ICD-10-CM | POA: Diagnosis not present

## 2019-05-02 ENCOUNTER — Encounter: Payer: Self-pay | Admitting: Family Medicine

## 2019-05-02 ENCOUNTER — Other Ambulatory Visit: Payer: Self-pay

## 2019-05-02 ENCOUNTER — Telehealth: Payer: Self-pay | Admitting: Family Medicine

## 2019-05-02 ENCOUNTER — Ambulatory Visit (INDEPENDENT_AMBULATORY_CARE_PROVIDER_SITE_OTHER): Payer: Medicare Other | Admitting: Family Medicine

## 2019-05-02 VITALS — BP 120/80 | HR 99 | Temp 98.3°F | Ht 63.0 in | Wt 205.5 lb

## 2019-05-02 DIAGNOSIS — R7303 Prediabetes: Secondary | ICD-10-CM | POA: Diagnosis not present

## 2019-05-02 DIAGNOSIS — Z96652 Presence of left artificial knee joint: Secondary | ICD-10-CM | POA: Diagnosis not present

## 2019-05-02 DIAGNOSIS — D62 Acute posthemorrhagic anemia: Secondary | ICD-10-CM

## 2019-05-02 DIAGNOSIS — E7800 Pure hypercholesterolemia, unspecified: Secondary | ICD-10-CM

## 2019-05-02 DIAGNOSIS — G8929 Other chronic pain: Secondary | ICD-10-CM

## 2019-05-02 DIAGNOSIS — M25562 Pain in left knee: Secondary | ICD-10-CM

## 2019-05-02 DIAGNOSIS — E781 Pure hyperglyceridemia: Secondary | ICD-10-CM | POA: Diagnosis not present

## 2019-05-02 DIAGNOSIS — E559 Vitamin D deficiency, unspecified: Secondary | ICD-10-CM

## 2019-05-02 NOTE — Progress Notes (Signed)
Established Patient Office Visit  Subjective:  Patient ID: Lori Mooney, female    DOB: 1945-04-22  Age: 74 y.o. MRN: [id number]  CC:  Chief Complaint  Patient presents with  . Follow-up    Hypertension    HPI Lori Mooney presents for   Chronic knee pain She had knee replacement in 2020 of the left knee She went to PT and states that she still does not know full range of motion of the left knee She keeps trying to exercise  She should be starting back with [employer] starting 4/16 through [employer] Right now she exercises on her own because she is not actively exercising she has worsening right knee pain and some back pain.   Hypertension: Patient here for follow-up of elevated blood pressure. She is exercising and is adherent to low salt diet.  Blood pressure is well controlled at home. Cardiac symptoms none. Patient denies chest pain, chest pressure/discomfort, claudication, exertional chest pressure/discomfort, irregular heart beat and lower extremity edema.  Cardiovascular risk factors: advanced age (older than 18 for men, 28 for women), hypertension and obesity (BMI >= 30 kg/m2). Use of agents associated with hypertension: none. History of target organ damage: none. Wt Readings from Last 3 Encounters:  05/02/19 205 lb 8 oz (93.2 kg)  10/31/18 200 lb 12.8 oz (91.1 kg)  08/24/18 203 lb (92.1 kg)   Dyslipidemia: Patient presents for evaluation of lipids.  Compliance with treatment thus far has been excellent.  A repeat fasting lipid profile was done.  The patient does not use medications that may worsen dyslipidemias (corticosteroids, progestins, anabolic steroids, diuretics, beta-blockers, amiodarone, cyclosporine, olanzapine). The patient exercises intermittently.  The patient is not known to have coexisting coronary artery disease.   The 10-year ASCVD risk score Mikey Bussing DC Brooke Bonito., et al., 2013) is: 15%   Values used to calculate the score:     Age: 14 years     Sex:  Female     Is Non-Hispanic African American: No     Diabetic: No     Tobacco smoker: No     Systolic Blood Pressure: 334 mmHg     Is BP treated: Yes     HDL Cholesterol: 62 mg/dL     Total Cholesterol: 188 mg/dL   Past Medical History:  Diagnosis Date  . Allergy   . 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 Cataract   . Exogenous obesity   . GERD (gastroesophageal reflux disease)   . Hyperlipidemia    hypercholesterolemia  . Hypertension     Past Surgical History:  Procedure Laterality Date  . CATARACT EXTRACTION W/ INTRAOCULAR LENS IMPLANT Left   . DILITATION & CURRETTAGE/HYSTROSCOPY WITH VERSAPOINT RESECTION N/A 08/25/2012   Procedure: DILATATION & CURETTAGE/HYSTEROSCOPY WITH VERSAPOINT RESECTION;  Surgeon: Princess Bruins, MD;  Location: [facility];  Service: Gynecology;  Laterality: N/A;  . EYE SURGERY    . TONSILLECTOMY    . 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   Procedure: TOTAL KNEE ARTHROPLASTY;  Surgeon: [name], MD;  Location: [facility];  Service: Orthopedics;  Laterality: Left;  11mn  . TUBAL LIGATION      Family History  Problem Relation Age of Onset  . Hyperlipidemia Mother   . Heart disease Mother   . Hypertension Mother   . Hypertension Father   . Hyperlipidemia Father   . Hyperlipidemia Sister   . Hypertension Sister     Social History   Socioeconomic History  .  Marital status: Married    Spouse name: Not on file  . Number of children: Not on file  . Years of education: Not on file  . Highest education level: Not on file  Occupational History  . Occupation: Retired Therapist, sports  Tobacco Use  . Smoking status: Never Smoker  . Smokeless tobacco: Never Used  Substance and Sexual Activity  . Alcohol use: Yes    Alcohol/week: 1.0 - 2.0 standard drinks    Types: 1 - 2 Standard drinks or equivalent per week  . Drug use: No  . Sexual activity: Yes  Other Topics Concern  . Not on file  Social History Narrative   Married. Education:  [employer].   Social Determinants of Health   Financial Resource Strain:   . Difficulty of Paying Living Expenses:   Food Insecurity:   . Worried About Charity fundraiser in the Last Year:   . Arboriculturist in the Last Year:   Transportation Needs:   . Film/video editor (Medical):   [name] Lack of Transportation (Non-Medical):   Physical Activity:   . Days of Exercise per Week:   . Minutes of Exercise per Session:   Stress:   . Feeling of Stress :   Social Connections:   . Frequency of Communication with Friends and Family:   . Frequency of Social Gatherings with Friends and Family:   . Attends Religious Services:   . Active Member of Clubs or Organizations:   . Attends Archivist Meetings:   [name] Marital Status:   Intimate Partner Violence:   . Fear of Current or Ex-Partner:   . Emotionally Abused:   [name] Physically Abused:   . Sexually Abused:     Outpatient Medications Prior to Visit  Medication Sig Dispense Refill  . acetaminophen (TYLENOL) 325 MG tablet Take 650 mg by mouth every 6 (six) hours as needed.    [name] atorvastatin (LIPITOR) 20 MG tablet Take 1 tablet by mouth once daily 90 tablet 0  . Cholecalciferol (VITAMIN D-3) 1000 UNITS CAPS Take 1,000 Units by mouth daily.    . famotidine (PEPCID) 10 MG tablet Take 10 mg by mouth at bedtime.     [name] lisinopril-hydrochlorothiazide (ZESTORETIC) 20-12.5 MG tablet Take 1 tablet by mouth daily. 90 tablet 1  . loratadine (CLARITIN) 10 MG tablet Take 10 mg by mouth daily as needed for allergies.     . magnesium 30 MG tablet Take 250 mg by mouth 2 (two) times daily.     No facility-administered medications prior to visit.    Allergies  Allergen Reactions  . Shellfish Allergy Hives  . Strawberry Extract Hives    ROS Review of Systems Review of Systems  Constitutional: Negative for activity change, appetite change, chills and fever.  HENT: Negative for congestion, nosebleeds, trouble swallowing and voice change.    Respiratory: Negative for cough, shortness of breath and wheezing.   Gastrointestinal: Negative for diarrhea, nausea and vomiting.  Genitourinary: Negative for difficulty urinating, dysuria, flank pain and hematuria.  Musculoskeletal: Negative for back pain, joint swelling and neck pain.  Neurological: Negative for dizziness, speech difficulty, light-headedness and numbness.  See HPI. All other review of systems negative.     Objective:    Physical Exam  BP 120/80 (BP Location: Left Arm, Patient Position: Sitting, Cuff Size: Normal)   Pulse 99   Temp 98.3 F (36.8 C) (Temporal)   Ht '5\' 3"'  (1.6 m)   Wt 205 lb 8 oz (  93.2 kg)   SpO2 96%   BMI 36.40 kg/m  Wt Readings from Last 3 Encounters:  05/02/19 205 lb 8 oz (93.2 kg)  10/31/18 200 lb 12.8 oz (91.1 kg)  08/24/18 203 lb (92.1 kg)   Physical Exam  Constitutional: Oriented to person, place, and time. Appears well-developed and well-nourished.  HENT:  Head: Normocephalic and atraumatic.  Eyes: Conjunctivae and EOM are normal.  Cardiovascular: Normal rate, regular rhythm, normal heart sounds and intact distal pulses.  No murmur heard. Pulmonary/Chest: Effort normal and breath sounds normal. No stridor. No respiratory distress. Has no wheezes.  Neurological: Is alert and oriented to person, place, and time.  Skin: Skin is warm. Capillary refill takes less than 2 seconds.  Psychiatric: Has a normal mood and affect. Behavior is normal. Judgment and thought content normal.  Knee with limited range of motion bilaterally Crepitus noted bilaterally LE edema on the left   Health Maintenance Due  Topic Date Due  . TETANUS/TDAP  03/21/2019    There are no preventive care reminders to display for this patient.  Lab Results  Component Value Date   TSH 3.030 10/19/2017   Lab Results  Component Value Date   WBC 10.8 (H) 08/26/2018   HGB 10.3 (L) 08/26/2018   HCT 32.6 (L) 08/26/2018   MCV 92.9 08/26/2018   PLT 212 08/26/2018    Lab Results  Component Value Date   NA 137 08/26/2018   K 3.8 08/26/2018   CO2 26 08/26/2018   GLUCOSE 107 (H) 08/26/2018   BUN 14 08/26/2018   CREATININE 0.87 08/26/2018   BILITOT 0.5 08/10/2018   ALKPHOS 74 08/10/2018   AST 18 08/10/2018   ALT 20 08/10/2018   PROT 6.9 08/10/2018   ALBUMIN 4.6 08/10/2018   CALCIUM 8.5 (L) 08/26/2018   ANIONGAP 8 08/26/2018   GFR 64.35 10/02/2014   Lab Results  Component Value Date   CHOL 188 08/10/2018   Lab Results  Component Value Date   HDL 62 08/10/2018   Lab Results  Component Value Date   LDLCALC 85 08/10/2018   Lab Results  Component Value Date   TRIG 203 (H) 08/10/2018   Lab Results  Component Value Date   CHOLHDL 3.0 08/10/2018   Lab Results  Component Value Date   HGBA1C 5.9 (H) 08/10/2018      Assessment & Plan:   Problem List Items Addressed This Visit      Other   Hypercholesterolemia - Primary -  Discussed medications that affect lipids Reminded patient to avoid grapefruits Reviewed last 3 lipids Discussed current meds: statin, aspirin Advised dietary fiber and fish oil and ways to keep HDL high CAD prevention and reviewed side effects of statins    Relevant Orders   CMP14+EGFR   Lipid Panel    Other Visit Diagnoses    Hypertriglyceridemia    -  Will assess, continue lipitor ascvd reviewed   Relevant Orders   CMP14+EGFR   Lipid Panel   Vitamin D deficiency    -  Will assess especially given her recent surgery and also her being in the home.  She takes d3 supplements   Relevant Orders   VITAMIN D 25 Hydroxy (Vit-D Deficiency, Fractures)   Acute blood loss anemia    -  Discussed goal of hemoglobin of 12 Her anemia was noted postoperatively   Relevant Orders   CBC   Prediabetes    -  Discussed diabetes prevention   Relevant Orders   CMP14+EGFR  Hemoglobin A1c   Chronic knee pain after total replacement of left knee joint    -  Discussed physical therapy to improve range of motion     Relevant Orders   Ambulatory referral to Physical Therapy      No orders of the defined types were placed in this encounter.   Follow-up: Return in about 6 months (around 11/01/2019) for MEDICARE WELLNESS .    Forrest Moron, MD

## 2019-05-02 NOTE — Telephone Encounter (Signed)
appt was on 4/6 needs a appt for 6 months (around 11/01/2019) for [place]

## 2019-05-02 NOTE — Patient Instructions (Addendum)
If you want to volunteer at the hospital ask for the TDAP. Occupational medicine can give this to you since you will be volunteering.  You are due for Td.  If you would like to get this we can do this in May since we are still monitoring you for the covid vaccine reactions.    If you have lab work done today you will be contacted with your lab results within the next 2 weeks.  If you have not heard from [place] then please contact us. The fastest way to get your results is to register for My Chart.   IF you received an x-ray today, you will receive an invoice from [facility]. Please contact [facility] at [phone] with questions or concerns regarding your invoice.   IF you received labwork today, you will receive an invoice from [facility]. Please contact [facility] at [phone] with questions or concerns regarding your invoice.   Our billing staff will not be able to assist you with questions regarding bills from these companies.  You will be contacted with the lab results as soon as they are available. The fastest way to get your results is to activate your My Chart account. Instructions are located on the last page of this paperwork. If you have not heard from [place] regarding the results in 2 weeks, please contact this office.

## 2019-05-03 ENCOUNTER — Telehealth: Payer: Self-pay | Admitting: Family Medicine

## 2019-05-03 LAB — CBC
Hematocrit: 45 % (ref 34.0–46.6)
Hemoglobin: 14.9 g/dL (ref 11.1–15.9)
MCH: 28.9 pg (ref 26.6–33.0)
MCHC: 33.1 g/dL (ref 31.5–35.7)
MCV: 87 fL (ref 79–97)
Platelets: 316 10*3/uL (ref 150–450)
RBC: 5.15 x10E6/uL (ref 3.77–5.28)
RDW: 13.3 % (ref 11.7–15.4)
WBC: 6.2 10*3/uL (ref 3.4–10.8)

## 2019-05-03 LAB — CMP14+EGFR
ALT: 17 IU/L (ref 0–32)
AST: 16 IU/L (ref 0–40)
Albumin/Globulin Ratio: 2.5 — ABNORMAL HIGH (ref 1.2–2.2)
Albumin: 4.7 g/dL (ref 3.7–4.7)
Alkaline Phosphatase: 88 IU/L (ref 39–117)
BUN/Creatinine Ratio: 16 (ref 12–28)
BUN: 15 mg/dL (ref 8–27)
Bilirubin Total: 0.4 mg/dL (ref 0.0–1.2)
CO2: 20 mmol/L (ref 20–29)
Calcium: 9.8 mg/dL (ref 8.7–10.3)
Chloride: 100 mmol/L (ref 96–106)
Creatinine, Ser: 0.92 mg/dL (ref 0.57–1.00)
GFR calc Af Amer: 71 mL/min/{1.73_m2} (ref >59)
GFR calc non Af Amer: 62 mL/min/{1.73_m2} (ref >59)
Globulin, Total: 1.9 g/dL (ref 1.5–4.5)
Glucose: 99 mg/dL (ref 65–99)
Potassium: 4.3 mmol/L (ref 3.5–5.2)
Sodium: 137 mmol/L (ref 134–144)
Total Protein: 6.6 g/dL (ref 6.0–8.5)

## 2019-05-03 LAB — LIPID PANEL
Chol/HDL Ratio: 3.2 ratio (ref 0.0–4.4)
Cholesterol, Total: 187 mg/dL (ref 100–199)
HDL: 59 mg/dL (ref >39)
LDL Chol Calc (NIH): 91 mg/dL (ref 0–99)
Triglycerides: 224 mg/dL — ABNORMAL HIGH (ref 0–149)
VLDL Cholesterol Cal: 37 mg/dL (ref 5–40)

## 2019-05-03 LAB — HEMOGLOBIN A1C
Est. average glucose Bld gHb Est-mCnc: 123 mg/dL
Hgb A1c MFr Bld: 5.9 % — ABNORMAL HIGH (ref 4.8–5.6)

## 2019-05-03 LAB — VITAMIN D 25 HYDROXY (VIT D DEFICIENCY, FRACTURES): Vit D, 25-Hydroxy: 43.1 ng/mL (ref 30.0–100.0)

## 2019-05-03 NOTE — Telephone Encounter (Signed)
Please Advise

## 2019-05-03 NOTE — Telephone Encounter (Signed)
Pt called and is wondering if she do do anything with her medications she is on from her labs she had done on 05/02/19. Pt states she is okay on refill right now just in the future she would like her medication sent in 3 month in advance. [phone] Please advise.

## 2019-05-05 DIAGNOSIS — M25669 Stiffness of unspecified knee, not elsewhere classified: Secondary | ICD-10-CM | POA: Diagnosis not present

## 2019-05-11 DIAGNOSIS — Z96652 Presence of left artificial knee joint: Secondary | ICD-10-CM | POA: Diagnosis not present

## 2019-05-11 DIAGNOSIS — Z471 Aftercare following joint replacement surgery: Secondary | ICD-10-CM | POA: Diagnosis not present

## 2019-05-26 ENCOUNTER — Encounter: Payer: Self-pay | Admitting: Family Medicine

## 2019-05-26 ENCOUNTER — Telehealth: Payer: Self-pay | Admitting: Family Medicine

## 2019-05-26 ENCOUNTER — Other Ambulatory Visit: Payer: Self-pay

## 2019-05-26 ENCOUNTER — Ambulatory Visit (INDEPENDENT_AMBULATORY_CARE_PROVIDER_SITE_OTHER): Payer: Medicare Other | Admitting: Family Medicine

## 2019-05-26 VITALS — BP 112/70 | HR 91 | Temp 98.0°F | Ht 63.0 in | Wt 206.0 lb

## 2019-05-26 DIAGNOSIS — E781 Pure hyperglyceridemia: Secondary | ICD-10-CM

## 2019-05-26 DIAGNOSIS — I10 Essential (primary) hypertension: Secondary | ICD-10-CM | POA: Diagnosis not present

## 2019-05-26 DIAGNOSIS — D62 Acute posthemorrhagic anemia: Secondary | ICD-10-CM

## 2019-05-26 DIAGNOSIS — E7800 Pure hypercholesterolemia, unspecified: Secondary | ICD-10-CM

## 2019-05-26 DIAGNOSIS — G8929 Other chronic pain: Secondary | ICD-10-CM | POA: Diagnosis not present

## 2019-05-26 DIAGNOSIS — R000 Tachycardia, unspecified: Secondary | ICD-10-CM | POA: Diagnosis not present

## 2019-05-26 DIAGNOSIS — K5900 Constipation, unspecified: Secondary | ICD-10-CM | POA: Diagnosis not present

## 2019-05-26 DIAGNOSIS — R002 Palpitations: Secondary | ICD-10-CM

## 2019-05-26 DIAGNOSIS — M25562 Pain in left knee: Secondary | ICD-10-CM

## 2019-05-26 DIAGNOSIS — E559 Vitamin D deficiency, unspecified: Secondary | ICD-10-CM

## 2019-05-26 DIAGNOSIS — R7303 Prediabetes: Secondary | ICD-10-CM

## 2019-05-26 MED ORDER — LISINOPRIL-HYDROCHLOROTHIAZIDE 20-12.5 MG PO TABS: 1.0000 | ORAL_TABLET | Freq: Every day | ORAL | 1 refills | Status: DC

## 2019-05-26 MED ORDER — ATORVASTATIN CALCIUM 20 MG PO TABS: 20.0000 mg | ORAL_TABLET | Freq: Every day | ORAL | 1 refills | Status: DC

## 2019-05-26 NOTE — Progress Notes (Signed)
Subjective:  Patient ID: Lori Mooney, female    DOB: November 14, 1945  Age: 74 y.o. MRN: [id number]  CC:  Chief Complaint  Patient presents with  . Transitions Of Care    get to know eachother   . left knee pain post opt July 29.    would like to talk about magnesium since she has pasty BM     HPI Lori Mooney presents for  Transition of care.  Previous primary provider Dr. Nolon Rod.  Has a history of obesity, hyperlipidemia, spinal stenosis, history of total left knee replacement, osteoarthritis of spine with lumbar radiculopathy, hypertension, prediabetes.   Left knee pain: TKR last July - Dr Gladstone Lighter. Still some pain with left knee. Some back pain at times with prior spinal stenosis.[name] tylenol twice per day has been doing ok. Stiff in the morning - improves during the day.   Stool/bowel changes: Has had some constipation off and on for some time. No recent narcotics. Constipation pat 2 weeks. Colace once per day - some improvement, but pasty consistency. BM multiple times per day - pasty.  miralax - in past, not recent.  Metamucil in past - not recent.  Has been taking magnesium 250mg  per day for bowel regularity and for sleep.   Colonoscopy 05/31/2011, 10-year repeat planned per health maintenance.  Hyperlipidemia: lipitor 20mg  qd. No new side effects.  Lab Results  Component Value Date   CHOL 187 05/02/2019   HDL 59 05/02/2019   LDLCALC 91 05/02/2019   LDLDIRECT 95.2 09/06/2012   TRIG 224 (H) 05/02/2019   CHOLHDL 3.2 05/02/2019   Lab Results  Component Value Date   ALT 17 05/02/2019   AST 16 05/02/2019   ALKPHOS 88 05/02/2019   BILITOT 0.4 05/02/2019    Hypertension, palpitations.  On lisinopril hct. Recent labs noted.  Rare palpitations few times per month - at rest, no inciting activities. Feels like beating fast. No associated dizziness/lightheadedness,  Lasts few mins.  2 cups coffee Qam. No recent changes.  Feels like HR runs high - 90-100. For  years.  Lab Results  Component Value Date   TSH 2.220 05/26/2019   Lab Results  Component Value Date   WBC 6.2 05/02/2019   HGB 14.9 05/02/2019   HCT 45.0 05/02/2019   MCV 87 05/02/2019   PLT 316 05/02/2019     BP Readings from Last 3 Encounters:  05/26/19 112/70  05/02/19 120/80  10/31/18 108/74   Lab Results  Component Value Date   CREATININE 0.92 05/02/2019        History Patient Active Problem List   Diagnosis Date Noted  . H/O total knee replacement, left 08/24/2018  . Osteoarthritis of spine with radiculopathy, lumbar region 06/25/2017  . Lumbar pain 06/25/2017  . Spinal stenosis of lumbar region without neurogenic claudication 06/25/2017  . Sciatica of right side 06/19/2017  . GERD (gastroesophageal reflux disease) 09/19/2013  . Vertigo 03/02/2012  . Hypercholesterolemia 10/08/2010  . Benign hypertensive heart disease without heart failure 10/08/2010  . Obesity 10/08/2010   Past Medical History:  Diagnosis Date  . Allergy   . 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 Cataract   . Exogenous obesity   . GERD (gastroesophageal reflux disease)   . Hyperlipidemia    hypercholesterolemia  . Hypertension    Past Surgical History:  Procedure Laterality Date  . CATARACT EXTRACTION W/ INTRAOCULAR LENS IMPLANT Left   . DILITATION & CURRETTAGE/HYSTROSCOPY WITH VERSAPOINT RESECTION N/A  08/25/2012   Procedure: DILATATION & CURETTAGE/HYSTEROSCOPY WITH VERSAPOINT RESECTION;  Surgeon: Princess Bruins, MD;  Location: [facility];  Service: Gynecology;  Laterality: N/A;  . EYE SURGERY    . TONSILLECTOMY    . 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   Procedure: TOTAL KNEE ARTHROPLASTY;  Surgeon: [name], MD;  Location: [facility];  Service: Orthopedics;  Laterality: Left;  1104min  . TUBAL LIGATION     Allergies  Allergen Reactions  . Shellfish Allergy Hives  . Strawberry Extract Hives   Prior to Admission medications   Medication Sig Start Date End  Date Taking? Authorizing Provider  acetaminophen (TYLENOL) 325 MG tablet Take 650 mg by mouth every 6 (six) hours as needed.   Yes [provider]  atorvastatin (LIPITOR) 20 MG tablet Take 1 tablet by mouth once daily 03/03/19  Yes Stallings, Zoe A, MD  Cholecalciferol (VITAMIN D-3) 1000 UNITS CAPS Take 1,000 Units by mouth daily.   Yes [provider]  famotidine (PEPCID) 10 MG tablet Take 10 mg by mouth at bedtime.    Yes [provider]  lisinopril-hydrochlorothiazide (ZESTORETIC) 20-12.5 MG tablet Take 1 tablet by mouth daily. 12/06/18  Yes Delia Chimes A, MD  loratadine (CLARITIN) 10 MG tablet Take 10 mg by mouth daily as needed for allergies.    Yes [provider]  magnesium 30 MG tablet Take 250 mg by mouth daily.    Yes [provider]   Social History   Socioeconomic History  . Marital status: Married    Spouse name: Not on file  . Number of children: Not on file  . Years of education: Not on file  . Highest education level: Not on file  Occupational History  . Occupation: Retired Therapist, sports  Tobacco Use  . Smoking status: Never Smoker  . Smokeless tobacco: Never Used  Substance and Sexual Activity  . Alcohol use: Yes    Alcohol/week: 1.0 - 2.0 standard drinks    Types: 1 - 2 Standard drinks or equivalent per week  . Drug use: No  . Sexual activity: Yes  Other Topics Concern  . Not on file  Social History Narrative   Married. Education: [employer].   Social Determinants of Health   Financial Resource Strain:   . Difficulty of Paying Living Expenses:   Food Insecurity:   . Worried About Charity fundraiser in the Last Year:   . Arboriculturist in the Last Year:   Transportation Needs:   . Film/video editor (Medical):   [name] Lack of Transportation (Non-Medical):   Physical Activity:   . Days of Exercise per Week:   . Minutes of Exercise per Session:   Stress:   . Feeling of Stress :   Social Connections:   . Frequency of  Communication with Friends and Family:   . Frequency of Social Gatherings with Friends and Family:   . Attends Religious Services:   . Active Member of Clubs or Organizations:   . Attends Archivist Meetings:   [name] Marital Status:   Intimate Partner Violence:   . Fear of Current or Ex-Partner:   . Emotionally Abused:   [name] Physically Abused:   . Sexually Abused:     Review of Systems  Constitutional: Negative for fatigue and unexpected weight change.  Respiratory: Negative for chest tightness and shortness of breath.   Cardiovascular: Positive for palpitations (rare). Negative for chest pain.  Gastrointestinal: Negative for abdominal pain and blood in stool.  Neurological: Negative for dizziness, syncope, light-headedness and headaches.     Objective:   Vitals:   05/26/19 0957 05/26/19 1007 05/26/19 1008  BP: (!) 159/87 122/70 112/70  Pulse: 91    Temp: 98 F (36.7 C)    TempSrc: Temporal    SpO2: 94%    Weight: 206 lb (93.4 kg)    Height: 5\' 3"  (1.6 m)       Physical Exam Vitals reviewed.  Constitutional:      Appearance: She is well-developed.  HENT:     Head: Normocephalic and atraumatic.  Eyes:     Conjunctiva/sclera: Conjunctivae normal.     Pupils: Pupils are equal, round, and reactive to light.  Neck:     Vascular: No carotid bruit.  Cardiovascular:     Rate and Rhythm: Normal rate and regular rhythm.     Heart sounds: Normal heart sounds.  Pulmonary:     Effort: Pulmonary effort is normal.     Breath sounds: Normal breath sounds.  Abdominal:     Palpations: Abdomen is soft. There is no pulsatile mass.     Tenderness: There is no abdominal tenderness.  Skin:    General: Skin is warm and dry.  Neurological:     Mental Status: She is alert and oriented to person, place, and time.  Psychiatric:        Behavior: Behavior normal.    EKG: SR, rate 78, low voltage precordial leads. No apparent change from 08/10/18.    Assessment & Plan:    Deserai Mcdowall is a 74 y.o. female . Hypercholesterolemia - Plan: atorvastatin (LIPITOR) 20 MG tablet  -  Stable, tolerating current regimen. Medications refilled.  Essential hypertension - Plan: lisinopril-hydrochlorothiazide (ZESTORETIC) 20-12.5 MG tablet  -  Stable, tolerating current regimen. Medications refilled.   Constipation, unspecified constipation type  -Increase fluid, fiber in the diet, fiber supplement recommended as well and MiraLAX as needed, recheck in next 2 weeks if not improving.  Chronic pain of left knee  -Recommended follow-up with her orthopedic surgeon.  Tachycardia - Plan: TSH, EKG 12-Lead Palpitations - Plan: TSH, EKG 12-Lead  -No concerning findings on EKG, check TSH.  Increase fluids may also help.  If persistent, cardiology eval.  ER precautions given.  Meds ordered this encounter  Medications  . lisinopril-hydrochlorothiazide (ZESTORETIC) 20-12.5 MG tablet    Sig: Take 1 tablet by mouth daily.    Dispense:  90 tablet    Refill:  1  . atorvastatin (LIPITOR) 20 MG tablet    Sig: Take 1 tablet (20 mg total) by mouth daily.    Dispense:  90 tablet    Refill:  1   Patient Instructions    For constipation - drink plenty of fluids, add citrucel or metamucil daily. Fiber in diet as well with fruits and vegetables. miralax if needed. Return to the clinic if not improved in next 2 weeks.  Tylenol if needed for knee pain.   I will check some tests, but try increasing fluids - water is best - to see if that helps heart rate and constipation. If palpitations continue, can refer you to cardiology.  Return to the clinic or go to the nearest emergency room if any of your symptoms worsen or new symptoms occur.  No change in meds for now. Nice talking to you today. Please let me know if there are questions.   Palpitations Palpitations are feelings that your heartbeat is irregular or is faster than normal. It may  feel like your heart is fluttering or  skipping a beat. Palpitations are usually not a serious problem. They may be caused by many things, including smoking, caffeine, alcohol, stress, and certain medicines or drugs. Most causes of palpitations are not serious. However, some palpitations can be a sign of a serious problem. You may need further tests to rule out serious medical problems. Follow these instructions at home:     Pay attention to any changes in your condition. Take these actions to help manage your symptoms: Eating and drinking  Avoid foods and drinks that may cause palpitations. These may include: ? Caffeinated coffee, tea, soft drinks, diet pills, and energy drinks. ? Chocolate. ? Alcohol. Lifestyle  Take steps to reduce your stress and anxiety. Things that can help you relax include: ? Yoga. ? Mind-body activities, such as deep breathing, meditation, or using words and images to create positive thoughts (guided imagery). ? Physical activity, such as swimming, jogging, or walking. Tell your health care provider if your palpitations increase with activity. If you have chest pain or shortness of breath with activity, do not continue the activity until you are seen by your health care provider. ? Biofeedback. This is a method that helps you learn to use your mind to control things in your body, such as your heartbeat.  Do not use drugs, including cocaine or ecstasy. Do not use marijuana.  Get plenty of rest and sleep. Keep a regular bed time. General instructions  Take over-the-counter and prescription medicines only as told by your health care provider.  Do not use any products that contain nicotine or tobacco, such as cigarettes and e-cigarettes. If you need help quitting, ask your health care provider.  Keep all follow-up visits as told by your health care provider. This is important. These may include visits for further testing if palpitations do not go away or get worse. Contact a health care provider if  you:  Continue to have a fast or irregular heartbeat after 24 hours.  Notice that your palpitations occur more often. Get help right away if you:  Have chest pain or shortness of breath.  Have a severe headache.  Feel dizzy or you faint. Summary  Palpitations are feelings that your heartbeat is irregular or is faster than normal. It may feel like your heart is fluttering or skipping a beat.  Palpitations may be caused by many things, including smoking, caffeine, alcohol, stress, certain medicines, and drugs.  Although most causes of palpitations are not serious, some causes can be a sign of a serious medical problem.  Get help right away if you faint or have chest pain, shortness of breath, a severe headache, or dizziness. This information is not intended to replace advice given to you by your health care provider. Make sure you discuss any questions you have with your health care provider. Document Revised: 02/24/2017 Document Reviewed: 02/24/2017 Elsevier Patient Education  [place].  Constipation, Adult Constipation is when a person has fewer bowel movements in a week than normal, has difficulty having a bowel movement, or has stools that are dry, hard, or larger than normal. Constipation may be caused by an underlying condition. It may become worse with age if a person takes certain medicines and does not take in enough fluids. Follow these instructions at home: Eating and drinking   Eat foods that have a lot of fiber, such as fresh fruits and vegetables, whole grains, and beans.  Limit foods that are high in fat,  low in fiber, or overly processed, such as french fries, hamburgers, cookies, candies, and soda.  Drink enough fluid to keep your urine clear or pale yellow. General instructions  Exercise regularly or as told by your health care provider.  Go to the restroom when you have the urge to go. Do not hold it in.  Take over-the-counter and prescription  medicines only as told by your health care provider. These include any fiber supplements.  Practice pelvic floor retraining exercises, such as deep breathing while relaxing the lower abdomen and pelvic floor relaxation during bowel movements.  Watch your condition for any changes.  Keep all follow-up visits as told by your health care provider. This is important. Contact a health care provider if:  You have pain that gets worse.  You have a fever.  You do not have a bowel movement after 4 days.  You vomit.  You are not hungry.  You lose weight.  You are bleeding from the anus.  You have thin, pencil-like stools. Get help right away if:  You have a fever and your symptoms suddenly get worse.  You leak stool or have blood in your stool.  Your abdomen is bloated.  You have severe pain in your abdomen.  You feel dizzy or you faint. This information is not intended to replace advice given to you by your health care provider. Make sure you discuss any questions you have with your health care provider. Document Revised: 12/25/2016 Document Reviewed: 07/03/2015 Elsevier Patient Education  [employer].    If you have lab work done today you will be contacted with your lab results within the next 2 weeks.  If you have not heard from [place] then please contact us. The fastest way to get your results is to register for My Chart.   IF you received an x-ray today, you will receive an invoice from [facility]. Please contact [facility] at [phone] with questions or concerns regarding your invoice.   IF you received labwork today, you will receive an invoice from [facility]. Please contact [facility] at [phone] with questions or concerns regarding your invoice.   Our billing staff will not be able to assist you with questions regarding bills from these companies.  You will be contacted with the lab results as soon as they are available. The fastest way to  get your results is to activate your My Chart account. Instructions are located on the last page of this paperwork. If you have not heard from [place] regarding the results in 2 weeks, please contact this office.     \     Signed, Merri Ray, MD [facility]

## 2019-05-26 NOTE — Telephone Encounter (Signed)
06/23/19 visit is to f/u on constipation.  Lab orders have already been placed for future visit for lab visit 11/20/19. Pt is to come back at 11/24/19 for 18m ov f/u.

## 2019-05-26 NOTE — Telephone Encounter (Signed)
05/26/2019 - PATIENT HAD A TOC WITH DR. Carlota Raspberry ON Friday (05/26/2019). HE HAS REQUESTED SHE RETURN IN 6 MONTHS FOR MEDICATION REVIEW AND LABS. PATIENT STATES DR. Carlota Raspberry TOLD HER TO COME IN EARLIER TO GET HER BLOOD DRAWN. THIS MESSAGE IS TO GET LAB ORDERS PLACED. HER OFFICE VISIT IS SCHEDULED FOR Friday 06/23/2019 AND HER NURSE APPOINTMENT IS ON Monday 11/20/2019. [place]

## 2019-05-26 NOTE — Patient Instructions (Addendum)
For constipation - drink plenty of fluids, add citrucel or metamucil daily. Fiber in diet as well with fruits and vegetables. miralax if needed. Return to the clinic if not improved in next 2 weeks.  Tylenol if needed for knee pain.   I will check some tests, but try increasing fluids - water is best - to see if that helps heart rate and constipation. If palpitations continue, can refer you to cardiology.  Return to the clinic or go to the nearest emergency room if any of your symptoms worsen or new symptoms occur.  No change in meds for now. Nice talking to you today. Please let me know if there are questions.   Palpitations Palpitations are feelings that your heartbeat is irregular or is faster than normal. It may feel like your heart is fluttering or skipping a beat. Palpitations are usually not a serious problem. They may be caused by many things, including smoking, caffeine, alcohol, stress, and certain medicines or drugs. Most causes of palpitations are not serious. However, some palpitations can be a sign of a serious problem. You may need further tests to rule out serious medical problems. Follow these instructions at home:     Pay attention to any changes in your condition. Take these actions to help manage your symptoms: Eating and drinking  Avoid foods and drinks that may cause palpitations. These may include: ? Caffeinated coffee, tea, soft drinks, diet pills, and energy drinks. ? Chocolate. ? Alcohol. Lifestyle  Take steps to reduce your stress and anxiety. Things that can help you relax include: ? Yoga. ? Mind-body activities, such as deep breathing, meditation, or using words and images to create positive thoughts (guided imagery). ? Physical activity, such as swimming, jogging, or walking. Tell your health care provider if your palpitations increase with activity. If you have chest pain or shortness of breath with activity, do not continue the activity until you are seen  by your health care provider. ? Biofeedback. This is a method that helps you learn to use your mind to control things in your body, such as your heartbeat.  Do not use drugs, including cocaine or ecstasy. Do not use marijuana.  Get plenty of rest and sleep. Keep a regular bed time. General instructions  Take over-the-counter and prescription medicines only as told by your health care provider.  Do not use any products that contain nicotine or tobacco, such as cigarettes and e-cigarettes. If you need help quitting, ask your health care provider.  Keep all follow-up visits as told by your health care provider. This is important. These may include visits for further testing if palpitations do not go away or get worse. Contact a health care provider if you:  Continue to have a fast or irregular heartbeat after 24 hours.  Notice that your palpitations occur more often. Get help right away if you:  Have chest pain or shortness of breath.  Have a severe headache.  Feel dizzy or you faint. Summary  Palpitations are feelings that your heartbeat is irregular or is faster than normal. It may feel like your heart is fluttering or skipping a beat.  Palpitations may be caused by many things, including smoking, caffeine, alcohol, stress, certain medicines, and drugs.  Although most causes of palpitations are not serious, some causes can be a sign of a serious medical problem.  Get help right away if you faint or have chest pain, shortness of breath, a severe headache, or dizziness. This information is not intended  to replace advice given to you by your health care provider. Make sure you discuss any questions you have with your health care provider. Document Revised: 02/24/2017 Document Reviewed: 02/24/2017 Elsevier Patient Education  [place].  Constipation, Adult Constipation is when a person has fewer bowel movements in a week than normal, has difficulty having a bowel movement,  or has stools that are dry, hard, or larger than normal. Constipation may be caused by an underlying condition. It may become worse with age if a person takes certain medicines and does not take in enough fluids. Follow these instructions at home: Eating and drinking   Eat foods that have a lot of fiber, such as fresh fruits and vegetables, whole grains, and beans.  Limit foods that are high in fat, low in fiber, or overly processed, such as french fries, hamburgers, cookies, candies, and soda.  Drink enough fluid to keep your urine clear or pale yellow. General instructions  Exercise regularly or as told by your health care provider.  Go to the restroom when you have the urge to go. Do not hold it in.  Take over-the-counter and prescription medicines only as told by your health care provider. These include any fiber supplements.  Practice pelvic floor retraining exercises, such as deep breathing while relaxing the lower abdomen and pelvic floor relaxation during bowel movements.  Watch your condition for any changes.  Keep all follow-up visits as told by your health care provider. This is important. Contact a health care provider if:  You have pain that gets worse.  You have a fever.  You do not have a bowel movement after 4 days.  You vomit.  You are not hungry.  You lose weight.  You are bleeding from the anus.  You have thin, pencil-like stools. Get help right away if:  You have a fever and your symptoms suddenly get worse.  You leak stool or have blood in your stool.  Your abdomen is bloated.  You have severe pain in your abdomen.  You feel dizzy or you faint. This information is not intended to replace advice given to you by your health care provider. Make sure you discuss any questions you have with your health care provider. Document Revised: 12/25/2016 Document Reviewed: 07/03/2015 Elsevier Patient Education  [employer].    If you have lab  work done today you will be contacted with your lab results within the next 2 weeks.  If you have not heard from [place] then please contact us. The fastest way to get your results is to register for My Chart.   IF you received an x-ray today, you will receive an invoice from [facility]. Please contact [facility] at [phone] with questions or concerns regarding your invoice.   IF you received labwork today, you will receive an invoice from [place]. Please contact [facility] at [phone] with questions or concerns regarding your invoice.   Our billing staff will not be able to assist you with questions regarding bills from these companies.  You will be contacted with the lab results as soon as they are available. The fastest way to get your results is to activate your My Chart account. Instructions are located on the last page of this paperwork. If you have not heard from [place] regarding the results in 2 weeks, please contact this office.     \

## 2019-05-27 LAB — TSH: TSH: 2.22 u[IU]/mL (ref 0.450–4.500)

## 2019-05-28 ENCOUNTER — Encounter: Payer: Self-pay | Admitting: Family Medicine

## 2019-06-16 ENCOUNTER — Other Ambulatory Visit: Payer: Self-pay | Admitting: Family Medicine

## 2019-06-16 DIAGNOSIS — Z1231 Encounter for screening mammogram for malignant neoplasm of breast: Secondary | ICD-10-CM

## 2019-06-23 ENCOUNTER — Ambulatory Visit: Payer: Medicare Other | Admitting: Family Medicine

## 2019-08-09 ENCOUNTER — Other Ambulatory Visit: Payer: Self-pay

## 2019-08-09 ENCOUNTER — Ambulatory Visit
Admission: RE | Admit: 2019-08-09 | Discharge: 2019-08-09 | Disposition: A | Discharge status: Home / Self Care | Payer: Medicare Other | Source: Ambulatory Visit | Attending: Family Medicine | Admitting: Family Medicine

## 2019-08-09 DIAGNOSIS — Z1231 Encounter for screening mammogram for malignant neoplasm of breast: Secondary | ICD-10-CM | POA: Diagnosis not present

## 2019-09-21 DIAGNOSIS — Z85828 Personal history of other malignant neoplasm of skin: Secondary | ICD-10-CM | POA: Diagnosis not present

## 2019-09-21 DIAGNOSIS — L570 Actinic keratosis: Secondary | ICD-10-CM | POA: Diagnosis not present

## 2019-09-21 DIAGNOSIS — L821 Other seborrheic keratosis: Secondary | ICD-10-CM | POA: Diagnosis not present

## 2019-09-21 DIAGNOSIS — D0472 Carcinoma in situ of skin of left lower limb, including hip: Secondary | ICD-10-CM | POA: Diagnosis not present

## 2019-09-21 DIAGNOSIS — D485 Neoplasm of uncertain behavior of skin: Secondary | ICD-10-CM | POA: Diagnosis not present

## 2019-11-20 ENCOUNTER — Ambulatory Visit: Payer: Medicare Other

## 2019-11-20 ENCOUNTER — Other Ambulatory Visit: Payer: Self-pay

## 2019-11-20 DIAGNOSIS — R7303 Prediabetes: Secondary | ICD-10-CM | POA: Diagnosis not present

## 2019-11-20 DIAGNOSIS — E781 Pure hyperglyceridemia: Secondary | ICD-10-CM

## 2019-11-20 DIAGNOSIS — I10 Essential (primary) hypertension: Secondary | ICD-10-CM

## 2019-11-20 DIAGNOSIS — D62 Acute posthemorrhagic anemia: Secondary | ICD-10-CM

## 2019-11-20 DIAGNOSIS — E7800 Pure hypercholesterolemia, unspecified: Secondary | ICD-10-CM

## 2019-11-21 LAB — LIPID PANEL
Chol/HDL Ratio: 3.2 ratio (ref 0.0–4.4)
Cholesterol, Total: 182 mg/dL (ref 100–199)
HDL: 57 mg/dL (ref >39)
LDL Chol Calc (NIH): 82 mg/dL (ref 0–99)
Triglycerides: 262 mg/dL — ABNORMAL HIGH (ref 0–149)
VLDL Cholesterol Cal: 43 mg/dL — ABNORMAL HIGH (ref 5–40)

## 2019-11-21 LAB — CBC
Hematocrit: 46.8 % — ABNORMAL HIGH (ref 34.0–46.6)
Hemoglobin: 15.2 g/dL (ref 11.1–15.9)
MCH: 29 pg (ref 26.6–33.0)
MCHC: 32.5 g/dL (ref 31.5–35.7)
MCV: 89 fL (ref 79–97)
Platelets: 309 10*3/uL (ref 150–450)
RBC: 5.25 x10E6/uL (ref 3.77–5.28)
RDW: 13 % (ref 11.7–15.4)
WBC: 6.5 10*3/uL (ref 3.4–10.8)

## 2019-11-21 LAB — CMP14+EGFR
ALT: 17 IU/L (ref 0–32)
AST: 15 IU/L (ref 0–40)
Albumin/Globulin Ratio: 2.2 (ref 1.2–2.2)
Albumin: 4.6 g/dL (ref 3.7–4.7)
Alkaline Phosphatase: 80 IU/L (ref 44–121)
BUN/Creatinine Ratio: 13 (ref 12–28)
BUN: 13 mg/dL (ref 8–27)
Bilirubin Total: 0.5 mg/dL (ref 0.0–1.2)
CO2: 21 mmol/L (ref 20–29)
Calcium: 10.4 mg/dL — ABNORMAL HIGH (ref 8.7–10.3)
Chloride: 100 mmol/L (ref 96–106)
Creatinine, Ser: 1 mg/dL (ref 0.57–1.00)
GFR calc Af Amer: 64 mL/min/{1.73_m2} (ref >59)
GFR calc non Af Amer: 56 mL/min/{1.73_m2} — ABNORMAL LOW (ref >59)
Globulin, Total: 2.1 g/dL (ref 1.5–4.5)
Glucose: 96 mg/dL (ref 65–99)
Potassium: 4.6 mmol/L (ref 3.5–5.2)
Sodium: 138 mmol/L (ref 134–144)
Total Protein: 6.7 g/dL (ref 6.0–8.5)

## 2019-11-21 LAB — HEMOGLOBIN A1C
Est. average glucose Bld gHb Est-mCnc: 126 mg/dL
Hgb A1c MFr Bld: 6 % — ABNORMAL HIGH (ref 4.8–5.6)

## 2019-11-21 LAB — TSH: TSH: 3.69 u[IU]/mL (ref 0.450–4.500)

## 2019-11-24 ENCOUNTER — Ambulatory Visit (INDEPENDENT_AMBULATORY_CARE_PROVIDER_SITE_OTHER): Payer: Medicare Other | Admitting: Family Medicine

## 2019-11-24 ENCOUNTER — Encounter: Payer: Self-pay | Admitting: Family Medicine

## 2019-11-24 ENCOUNTER — Other Ambulatory Visit: Payer: Self-pay

## 2019-11-24 VITALS — BP 129/85 | HR 97 | Temp 98.0°F | Ht 63.0 in | Wt 208.0 lb

## 2019-11-24 DIAGNOSIS — E7800 Pure hypercholesterolemia, unspecified: Secondary | ICD-10-CM

## 2019-11-24 DIAGNOSIS — I10 Essential (primary) hypertension: Secondary | ICD-10-CM

## 2019-11-24 DIAGNOSIS — R7303 Prediabetes: Secondary | ICD-10-CM

## 2019-11-24 DIAGNOSIS — E781 Pure hyperglyceridemia: Secondary | ICD-10-CM | POA: Diagnosis not present

## 2019-11-24 DIAGNOSIS — M5136 Other intervertebral disc degeneration, lumbar region: Secondary | ICD-10-CM

## 2019-11-24 DIAGNOSIS — Z6836 Body mass index (BMI) 36.0-36.9, adult: Secondary | ICD-10-CM

## 2019-11-24 DIAGNOSIS — Z23 Encounter for immunization: Secondary | ICD-10-CM | POA: Diagnosis not present

## 2019-11-24 DIAGNOSIS — M5432 Sciatica, left side: Secondary | ICD-10-CM | POA: Diagnosis not present

## 2019-11-24 MED ORDER — LISINOPRIL-HYDROCHLOROTHIAZIDE 20-12.5 MG PO TABS: 1.0000 | ORAL_TABLET | Freq: Every day | ORAL | 1 refills | Status: DC

## 2019-11-24 MED ORDER — MELOXICAM 7.5 MG PO TABS: 7.5000 mg | ORAL_TABLET | Freq: Every day | ORAL | 0 refills | Status: DC | PRN

## 2019-11-24 MED ORDER — ATORVASTATIN CALCIUM 20 MG PO TABS: 20.0000 mg | ORAL_TABLET | Freq: Every day | ORAL | 1 refills | Status: DC

## 2019-11-24 NOTE — Patient Instructions (Addendum)
Labs look ok. Borderline calcium can be repeated in 3 months.  No med changes at this time.   [facility] - Healthy weight and Wellness. Let me know if not covered or other numbers needed.  . [phone]  For sciatica: meloxicam short term for a few days to a week if needed. Tylenol is ok to take daily if needed. If not improving, I would consider trying PT or meeting with Dr. Ernestina Patches - call him if needed.   Sciatica  Sciatica is pain, numbness, weakness, or tingling along the path of the sciatic nerve. The sciatic nerve starts in the lower back and runs down the back of each leg. The nerve controls the muscles in the lower leg and in the back of the knee. It also provides feeling (sensation) to the back of the thigh, the lower leg, and the sole of the foot. Sciatica is a symptom of another medical condition that pinches or puts pressure on the sciatic nerve. Sciatica most often only affects one side of the body. Sciatica usually goes away on its own or with treatment. In some cases, sciatica may come back (recur). What are the causes? This condition is caused by pressure on the sciatic nerve or pinching of the nerve. This may be the result of:  A disk in between the bones of the spine bulging out too far (herniated disk).  Age-related changes in the spinal disks.  A pain disorder that affects a muscle in the buttock.  Extra bone growth near the sciatic nerve.  A break (fracture) of the pelvis.  Pregnancy.  Tumor. This is rare. What increases the risk? The following factors may make you more likely to develop this condition:  Playing sports that place pressure or stress on the spine.  Having poor strength and flexibility.  A history of back injury or surgery.  Sitting for long periods of time.  Doing activities that involve repetitive bending or lifting.  Obesity. What are the signs or symptoms? Symptoms can vary from mild to very severe, and they may  include:  Any of these problems in the lower back, leg, hip, or buttock: ? Mild tingling, numbness, or dull aches. ? Burning sensations. ? Sharp pains.  Numbness in the back of the calf or the sole of the foot.  Leg weakness.  Severe back pain that makes movement difficult. Symptoms may get worse when you cough, sneeze, or laugh, or when you sit or stand for long periods of time. How is this diagnosed? This condition may be diagnosed based on:  Your symptoms and medical history.  A physical exam.  Blood tests.  Imaging tests, such as: ? X-rays. ? MRI. ? CT scan. How is this treated? In many cases, this condition improves on its own without treatment. However, treatment may include:  Reducing or modifying physical activity.  Exercising and stretching.  Icing and applying heat to the affected area.  Medicines that help to: ? Relieve pain and swelling. ? Relax your muscles.  Injections of medicines that help to relieve pain, irritation, and inflammation around the sciatic nerve (steroids).  Surgery. Follow these instructions at home: Medicines  Take over-the-counter and prescription medicines only as told by your health care provider.  Ask your health care provider if the medicine prescribed to you: ? Requires you to avoid driving or using heavy machinery. ? Can cause constipation. You may need to take these actions to prevent or treat constipation:  Drink enough fluid to keep  your urine pale yellow.  Take over-the-counter or prescription medicines.  Eat foods that are high in fiber, such as beans, whole grains, and fresh fruits and vegetables.  Limit foods that are high in fat and processed sugars, such as fried or sweet foods. Managing pain      If directed, put ice on the affected area. ? Put ice in a plastic bag. ? Place a towel between your skin and the bag. ? Leave the ice on for 20 minutes, 2-3 times a day.  If directed, apply heat to the  affected area. Use the heat source that your health care provider recommends, such as a moist heat pack or a heating pad. ? Place a towel between your skin and the heat source. ? Leave the heat on for 20-30 minutes. ? Remove the heat if your skin turns bright red. This is especially important if you are unable to feel pain, heat, or cold. You may have a greater risk of getting burned. Activity   Return to your normal activities as told by your health care provider. Ask your health care provider what activities are safe for you.  Avoid activities that make your symptoms worse.  Take brief periods of rest throughout the day. ? When you rest for longer periods, mix in some mild activity or stretching between periods of rest. This will help to prevent stiffness and pain. ? Avoid sitting for long periods of time without moving. Get up and move around at least one time each hour.  Exercise and stretch regularly, as told by your health care provider.  Do not lift anything that is heavier than 10 lb (4.5 kg) while you have symptoms of sciatica. When you do not have symptoms, you should still avoid heavy lifting, especially repetitive heavy lifting.  When you lift objects, always use proper lifting technique, which includes: ? Bending your knees. ? Keeping the load close to your body. ? Avoiding twisting. General instructions  Maintain a healthy weight. Excess weight puts extra stress on your back.  Wear supportive, comfortable shoes. Avoid wearing high heels.  Avoid sleeping on a mattress that is too soft or too hard. A mattress that is firm enough to support your back when you sleep may help to reduce your pain.  Keep all follow-up visits as told by your health care provider. This is important. Contact a health care provider if:  You have pain that: ? Wakes you up when you are sleeping. ? Gets worse when you lie down. ? Is worse than you have experienced in the past. ? Lasts longer  than 4 weeks.  You have an unexplained weight loss. Get help right away if:  You are not able to control when you urinate or have bowel movements (incontinence).  You have: ? Weakness in your lower back, pelvis, buttocks, or legs that gets worse. ? Redness or swelling of your back. ? A burning sensation when you urinate. Summary  Sciatica is pain, numbness, weakness, or tingling along the path of the sciatic nerve.  This condition is caused by pressure on the sciatic nerve or pinching of the nerve.  Sciatica can cause pain, numbness, or tingling in the lower back, legs, hips, and buttocks.  Treatment often includes rest, exercise, medicines, and applying ice or heat. This information is not intended to replace advice given to you by your health care provider. Make sure you discuss any questions you have with your health care provider. Document Revised: 01/31/2018 Document Reviewed:  01/31/2018 [employer] Patient Education  [employer].    If you have lab work done today you will be contacted with your lab results within the next 2 weeks.  If you have not heard from [place] then please contact us. The fastest way to get your results is to register for My Chart.   IF you received an x-ray today, you will receive an invoice from [facility]. Please contact [facility] at [phone] with questions or concerns regarding your invoice.   IF you received labwork today, you will receive an invoice from [facility]. Please contact [facility] at [phone] with questions or concerns regarding your invoice.   Our billing staff will not be able to assist you with questions regarding bills from these companies.  You will be contacted with the lab results as soon as they are available. The fastest way to get your results is to activate your My Chart account. Instructions are located on the last page of this paperwork. If you have not heard from [place] regarding the results in 2 weeks,  please contact this office.

## 2019-11-24 NOTE — Progress Notes (Signed)
Subjective:  Patient ID: Lori Mooney, female    DOB: 04/13/1945  Age: 74 y.o. MRN: [id number]  CC:  Chief Complaint  Patient presents with  . Medication Refill    Pt reports neededing a refill on Lisinopril. Pt reports medication works well with no side effects.  . siatica    PT reports still having issues with siatica, and would like the providers input on treatment.    HPI Lori Mooney presents for   Hypertension: Treated with lisinopril HCTZ 20/12.5 mg daily. Home readings:107/72.  No lightheadedness/dizziness, or new side effects.  BP Readings from Last 3 Encounters:  11/24/19 129/85  05/26/19 112/70  05/02/19 120/80   Lab Results  Component Value Date   CREATININE 1.00 11/20/2019   Hyperlipidemia: Lipitor 20 mg daily. No new myalgias/side effects.  Lab Results  Component Value Date   CHOL 182 11/20/2019   HDL 57 11/20/2019   LDLCALC 82 11/20/2019   LDLDIRECT 95.2 09/06/2012   TRIG 262 (H) 11/20/2019   CHOLHDL 3.2 11/20/2019   Lab Results  Component Value Date   ALT 17 11/20/2019   AST 15 11/20/2019   ALKPHOS 80 11/20/2019   BILITOT 0.5 11/20/2019   Right-sided sciatica: History of spinal stenosis - lumbar/osteoarthritis of spine with prior radiculopathy. Treated with physical therapy at outpatient rehab on [facility] in 2019.  MRI lumbar spine in May 2019 with advanced multilevel degenerative disease, spinal stenosis advanced at L3-4 with moderately advanced at L4-5.  Moderate foraminal narrowing on the right at L4-5 and right more than left at L5-S1, and L2-3 left subarticular recess stenosis possibly affecting the left L3 nerve root.  Recently has had some flairs of her sciatica, same pain. Still some pain down left. Pain in both sides last night in buttocks.  Injection in 2019. Dr. Ernestina Patches.  HEP - some limited by left knee issue.  Tried old meloxicam yesterday that helped.  No bowel or bladder incontinence, no saddle anesthesia, no  lower extremity weakness.  No fever, night sweats, wt loss.  Tylenol or advil daily.  No hx of GI bleeding.    Prediabetes/obesity: Exercise - aerobics 2 times per week. Limited by left knee, prior replacement - July 2020  Fast food/take out - minimal. Doing well with portion control. May be issue  Soda/sweet tea - rare. Breakfast QD.  Nutritionist - years ago. Not covered by insurance.  Weight watchers- did not work.   Lab Results  Component Value Date   TSH 3.690 11/20/2019   Body mass index is 36.85 kg/m. Lab Results  Component Value Date   HGBA1C 6.0 (H) 11/20/2019   Wt Readings from Last 3 Encounters:  11/24/19 208 lb (94.3 kg)  05/26/19 206 lb (93.4 kg)  05/02/19 205 lb 8 oz (93.2 kg)   tdap today - aware of potential out of pocket cost.   Planning on covid vaccine booster.   History Patient Active Problem List   Diagnosis Date Noted  . H/O total knee replacement, left 08/24/2018  . Osteoarthritis of spine with radiculopathy, lumbar region 06/25/2017  . Lumbar pain 06/25/2017  . Spinal stenosis of lumbar region without neurogenic claudication 06/25/2017  . Sciatica of right side 06/19/2017  . GERD (gastroesophageal reflux disease) 09/19/2013  . Vertigo 03/02/2012  . Hypercholesterolemia 10/08/2010  . Benign hypertensive heart disease without heart failure 10/08/2010  . Obesity 10/08/2010   Past Medical History:  Diagnosis Date  . Allergy   . Arthritis    history  of arthritis in the fingers  . Cataract   . Exogenous obesity   . GERD (gastroesophageal reflux disease)   . Hyperlipidemia    hypercholesterolemia  . Hypertension    Past Surgical History:  Procedure Laterality Date  . CATARACT EXTRACTION W/ INTRAOCULAR LENS IMPLANT Left   . DILITATION & CURRETTAGE/HYSTROSCOPY WITH VERSAPOINT RESECTION N/A 08/25/2012   Procedure: DILATATION & CURETTAGE/HYSTEROSCOPY WITH VERSAPOINT RESECTION;  Surgeon: Princess Bruins, MD;  Location: [facility];  Service:  Gynecology;  Laterality: N/A;  . EYE SURGERY    . TONSILLECTOMY    . 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   Procedure: TOTAL KNEE ARTHROPLASTY;  Surgeon: [name], MD;  Location: [facility];  Service: Orthopedics;  Laterality: Left;  167min  . TUBAL LIGATION     Allergies  Allergen Reactions  . Shellfish Allergy Hives  . Strawberry Extract Hives   Prior to Admission medications   Medication Sig Start Date End Date Taking? Authorizing Provider  acetaminophen (TYLENOL) 325 MG tablet Take 650 mg by mouth every 6 (six) hours as needed.   Yes [provider]  atorvastatin (LIPITOR) 20 MG tablet Take 1 tablet (20 mg total) by mouth daily. 05/26/19  Yes [name], MD  Cholecalciferol (VITAMIN D-3) 1000 UNITS CAPS Take 1,000 Units by mouth daily.   Yes [provider]  famotidine (PEPCID) 10 MG tablet Take 10 mg by mouth at bedtime.    Yes [provider]  lisinopril-hydrochlorothiazide (ZESTORETIC) 20-12.5 MG tablet Take 1 tablet by mouth daily. 05/26/19  Yes [name], MD  loratadine (CLARITIN) 10 MG tablet Take 10 mg by mouth daily as needed for allergies.    Yes [provider]  magnesium 30 MG tablet Take 250 mg by mouth daily.    Yes [provider]   Social History   Socioeconomic History  . Marital status: Married    Spouse name: Not on file  . Number of children: Not on file  . Years of education: Not on file  . Highest education level: Not on file  Occupational History  . Occupation: Retired Therapist, sports  Tobacco Use  . Smoking status: Never Smoker  . Smokeless tobacco: Never Used  Vaping Use  . Vaping Use: Never used  Substance and Sexual Activity  . Alcohol use: Yes    Alcohol/week: 1.0 - 2.0 standard drink    Types: 1 - 2 Standard drinks or equivalent per week  . Drug use: No  . Sexual activity: Yes  Other Topics Concern  . Not on file  Social History Narrative   Married. Education: [employer].   Social  Determinants of Health   Financial Resource Strain:   . Difficulty of Paying Living Expenses: Not on file  Food Insecurity:   . Worried About Charity fundraiser in the Last Year: Not on file  . Ran Out of Food in the Last Year: Not on file  Transportation Needs:   . Lack of Transportation (Medical): Not on file  . Lack of Transportation (Non-Medical): Not on file  Physical Activity:   . Days of Exercise per Week: Not on file  . Minutes of Exercise per Session: Not on file  Stress:   . Feeling of Stress : Not on file  Social Connections:   . Frequency of Communication with Friends and Family: Not on file  . Frequency of Social Gatherings with Friends and Family: Not on file  . Attends Religious Services: Not on file  . Active  Member of Clubs or Organizations: Not on file  . Attends Archivist Meetings: Not on file  . Marital Status: Not on file  Intimate Partner Violence:   . Fear of Current or Ex-Partner: Not on file  . Emotionally Abused: Not on file  . Physically Abused: Not on file  . Sexually Abused: Not on file    Review of Systems  Constitutional: Negative for fatigue and unexpected weight change.  Respiratory: Negative for chest tightness and shortness of breath.   Cardiovascular: Negative for chest pain, palpitations and leg swelling.  Gastrointestinal: Negative for abdominal pain and blood in stool.  Neurological: Negative for dizziness, syncope, light-headedness and headaches.     Objective:   Vitals:   11/24/19 0848  BP: 129/85  Pulse: 97  Temp: 98 F (36.7 C)  TempSrc: Temporal  SpO2: 94%  Weight: 208 lb (94.3 kg)  Height: 5\' 3"  (1.6 m)     Physical Exam Vitals reviewed.  Constitutional:      Appearance: Normal appearance. She is well-developed. She is obese. She is not ill-appearing.  HENT:     Head: Normocephalic and atraumatic.  Eyes:     Conjunctiva/sclera: Conjunctivae normal.     Pupils: Pupils are equal, round, and reactive to  light.  Neck:     Vascular: No carotid bruit.  Cardiovascular:     Rate and Rhythm: Normal rate and regular rhythm.     Heart sounds: Normal heart sounds.  Pulmonary:     Effort: Pulmonary effort is normal.     Breath sounds: Normal breath sounds.  Abdominal:     Palpations: Abdomen is soft. There is no pulsatile mass.     Tenderness: There is no abdominal tenderness.  Skin:    General: Skin is warm and dry.  Neurological:     Mental Status: She is alert and oriented to person, place, and time.  Psychiatric:        Behavior: Behavior normal.        Assessment & Plan:  Serayah Yazdani is a 74 y.o. female . Essential hypertension - Plan: lisinopril-hydrochlorothiazide (ZESTORETIC) 20-12.5 MG tablet   -  Stable, tolerating current regimen. Medications refilled. Labs  as above.   Need for vaccination - Plan: Tdap vaccine greater than or equal to 7yo IM  Hypercholesterolemia - Plan: atorvastatin (LIPITOR) 20 MG tablet Hypertriglyceridemia  -  Stable, tolerating current regimen. Medications refilled.   Prediabetes BMI 36.0-36.9,adult  - portion control, continue diet monitoring.   - phone number provided to healthy weight and wellness.   Sciatica, left side - Plan: meloxicam (MOBIC) 7.5 MG tablet DDD (degenerative disc disease), lumbar - Plan: meloxicam (MOBIC) 7.5 MG tablet  -Intermittent meloxicam as option, potential side effects and long-term risks discussed.  Consider repeat PT or meeting with Dr. Ernestina Patches.  RTC precautions.  Handout given  Meds ordered this encounter  Medications  . lisinopril-hydrochlorothiazide (ZESTORETIC) 20-12.5 MG tablet    Sig: Take 1 tablet by mouth daily.    Dispense:  90 tablet    Refill:  1  . atorvastatin (LIPITOR) 20 MG tablet    Sig: Take 1 tablet (20 mg total) by mouth daily.    Dispense:  90 tablet    Refill:  1  . meloxicam (MOBIC) 7.5 MG tablet    Sig: Take 1 tablet (7.5 mg total) by mouth daily as needed for pain.     Dispense:  30 tablet    Refill:  0  Patient Instructions    Labs look ok. Borderline calcium can be repeated in 3 months.  No med changes at this time.   [facility] Weight Loss Management - Healthy weight and Wellness. Let me know if not covered or other numbers needed.  . [phone]  For sciatica: meloxicam short term for a few days to a week if needed. Tylenol is ok to take daily if needed. If not improving, I would consider trying PT or meeting with Dr. Ernestina Patches - call him if needed.   Sciatica  Sciatica is pain, numbness, weakness, or tingling along the path of the sciatic nerve. The sciatic nerve starts in the lower back and runs down the back of each leg. The nerve controls the muscles in the lower leg and in the back of the knee. It also provides feeling (sensation) to the back of the thigh, the lower leg, and the sole of the foot. Sciatica is a symptom of another medical condition that pinches or puts pressure on the sciatic nerve. Sciatica most often only affects one side of the body. Sciatica usually goes away on its own or with treatment. In some cases, sciatica may come back (recur). What are the causes? This condition is caused by pressure on the sciatic nerve or pinching of the nerve. This may be the result of:  A disk in between the bones of the spine bulging out too far (herniated disk).  Age-related changes in the spinal disks.  A pain disorder that affects a muscle in the buttock.  Extra bone growth near the sciatic nerve.  A break (fracture) of the pelvis.  Pregnancy.  Tumor. This is rare. What increases the risk? The following factors may make you more likely to develop this condition:  Playing sports that place pressure or stress on the spine.  Having poor strength and flexibility.  A history of back injury or surgery.  Sitting for long periods of time.  Doing activities that involve repetitive bending or lifting.  Obesity. What are the signs or  symptoms? Symptoms can vary from mild to very severe, and they may include:  Any of these problems in the lower back, leg, hip, or buttock: ? Mild tingling, numbness, or dull aches. ? Burning sensations. ? Sharp pains.  Numbness in the back of the calf or the sole of the foot.  Leg weakness.  Severe back pain that makes movement difficult. Symptoms may get worse when you cough, sneeze, or laugh, or when you sit or stand for long periods of time. How is this diagnosed? This condition may be diagnosed based on:  Your symptoms and medical history.  A physical exam.  Blood tests.  Imaging tests, such as: ? X-rays. ? MRI. ? CT scan. How is this treated? In many cases, this condition improves on its own without treatment. However, treatment may include:  Reducing or modifying physical activity.  Exercising and stretching.  Icing and applying heat to the affected area.  Medicines that help to: ? Relieve pain and swelling. ? Relax your muscles.  Injections of medicines that help to relieve pain, irritation, and inflammation around the sciatic nerve (steroids).  Surgery. Follow these instructions at home: Medicines  Take over-the-counter and prescription medicines only as told by your health care provider.  Ask your health care provider if the medicine prescribed to you: ? Requires you to avoid driving or using heavy machinery. ? Can cause constipation. You may need to take these actions to prevent or treat constipation:  Drink enough fluid to keep your urine pale yellow.  Take over-the-counter or prescription medicines.  Eat foods that are high in fiber, such as beans, whole grains, and fresh fruits and vegetables.  Limit foods that are high in fat and processed sugars, such as fried or sweet foods. Managing pain      If directed, put ice on the affected area. ? Put ice in a plastic bag. ? Place a towel between your skin and the bag. ? Leave the ice on for  20 minutes, 2-3 times a day.  If directed, apply heat to the affected area. Use the heat source that your health care provider recommends, such as a moist heat pack or a heating pad. ? Place a towel between your skin and the heat source. ? Leave the heat on for 20-30 minutes. ? Remove the heat if your skin turns bright red. This is especially important if you are unable to feel pain, heat, or cold. You may have a greater risk of getting burned. Activity   Return to your normal activities as told by your health care provider. Ask your health care provider what activities are safe for you.  Avoid activities that make your symptoms worse.  Take brief periods of rest throughout the day. ? When you rest for longer periods, mix in some mild activity or stretching between periods of rest. This will help to prevent stiffness and pain. ? Avoid sitting for long periods of time without moving. Get up and move around at least one time each hour.  Exercise and stretch regularly, as told by your health care provider.  Do not lift anything that is heavier than 10 lb (4.5 kg) while you have symptoms of sciatica. When you do not have symptoms, you should still avoid heavy lifting, especially repetitive heavy lifting.  When you lift objects, always use proper lifting technique, which includes: ? Bending your knees. ? Keeping the load close to your body. ? Avoiding twisting. General instructions  Maintain a healthy weight. Excess weight puts extra stress on your back.  Wear supportive, comfortable shoes. Avoid wearing high heels.  Avoid sleeping on a mattress that is too soft or too hard. A mattress that is firm enough to support your back when you sleep may help to reduce your pain.  Keep all follow-up visits as told by your health care provider. This is important. Contact a health care provider if:  You have pain that: ? Wakes you up when you are sleeping. ? Gets worse when you lie down. ? Is  worse than you have experienced in the past. ? Lasts longer than 4 weeks.  You have an unexplained weight loss. Get help right away if:  You are not able to control when you urinate or have bowel movements (incontinence).  You have: ? Weakness in your lower back, pelvis, buttocks, or legs that gets worse. ? Redness or swelling of your back. ? A burning sensation when you urinate. Summary  Sciatica is pain, numbness, weakness, or tingling along the path of the sciatic nerve.  This condition is caused by pressure on the sciatic nerve or pinching of the nerve.  Sciatica can cause pain, numbness, or tingling in the lower back, legs, hips, and buttocks.  Treatment often includes rest, exercise, medicines, and applying ice or heat. This information is not intended to replace advice given to you by your health care provider. Make sure you discuss any questions you have with your health care provider.  Document Revised: 01/31/2018 Document Reviewed: 01/31/2018 Elsevier Patient Education  [employer].    If you have lab work done today you will be contacted with your lab results within the next 2 weeks.  If you have not heard from [place] then please contact us. The fastest way to get your results is to register for My Chart.   IF you received an x-ray today, you will receive an invoice from [facility]. Please contact [facility] at [phone] with questions or concerns regarding your invoice.   IF you received labwork today, you will receive an invoice from [facility]. Please contact [facility] at [phone] with questions or concerns regarding your invoice.   Our billing staff will not be able to assist you with questions regarding bills from these companies.  You will be contacted with the lab results as soon as they are available. The fastest way to get your results is to activate your My Chart account. Instructions are located on the last page of this paperwork. If  you have not heard from [place] regarding the results in 2 weeks, please contact this office.         Signed, Merri Ray, MD [facility]

## 2019-12-09 ENCOUNTER — Ambulatory Visit: Payer: Medicare Other | Attending: Internal Medicine

## 2019-12-09 DIAGNOSIS — Z23 Encounter for immunization: Secondary | ICD-10-CM

## 2019-12-09 NOTE — Progress Notes (Signed)
   Covid-19 Vaccination Clinic  Name:  Lori Mooney    MRN: [id number] DOB: 1946/01/07  12/09/2019  Lori Mooney was observed post Covid-19 immunization for 15 minutes without incident. She was provided with Vaccine Information Sheet and instruction to access the V-Safe system.   Lori Mooney was instructed to call 911 with any severe reactions post vaccine: [name] Difficulty breathing  . Swelling of face and throat  . A fast heartbeat  . A bad rash all over body  . Dizziness and weakness   Immunizations Administered    Name Date Dose VIS Date Route   Pfizer COVID-19 Vaccine 12/09/2019  3:37 PM 0.3 mL 11/15/2019 Intramuscular   Manufacturer: [place]   Lot: [id number]   [place]: [id number]

## 2019-12-27 DIAGNOSIS — L570 Actinic keratosis: Secondary | ICD-10-CM | POA: Diagnosis not present

## 2019-12-27 DIAGNOSIS — L820 Inflamed seborrheic keratosis: Secondary | ICD-10-CM | POA: Diagnosis not present

## 2019-12-27 DIAGNOSIS — L821 Other seborrheic keratosis: Secondary | ICD-10-CM | POA: Diagnosis not present

## 2019-12-27 DIAGNOSIS — D485 Neoplasm of uncertain behavior of skin: Secondary | ICD-10-CM | POA: Diagnosis not present

## 2019-12-27 DIAGNOSIS — C44619 Basal cell carcinoma of skin of left upper limb, including shoulder: Secondary | ICD-10-CM | POA: Diagnosis not present

## 2019-12-27 DIAGNOSIS — Z85828 Personal history of other malignant neoplasm of skin: Secondary | ICD-10-CM | POA: Diagnosis not present

## 2020-01-09 ENCOUNTER — Telehealth: Payer: Self-pay | Admitting: *Deleted

## 2020-01-11 ENCOUNTER — Telehealth: Payer: Self-pay | Admitting: *Deleted

## 2020-01-11 ENCOUNTER — Ambulatory Visit (INDEPENDENT_AMBULATORY_CARE_PROVIDER_SITE_OTHER): Payer: Medicare Other | Admitting: Family Medicine

## 2020-01-11 ENCOUNTER — Encounter: Payer: Self-pay | Admitting: *Deleted

## 2020-01-11 VITALS — BP 129/85 | Ht 63.0 in | Wt 208.0 lb

## 2020-01-11 DIAGNOSIS — Z0000 Encounter for general adult medical examination without abnormal findings: Secondary | ICD-10-CM | POA: Diagnosis not present

## 2020-01-11 DIAGNOSIS — M5136 Other intervertebral disc degeneration, lumbar region: Secondary | ICD-10-CM

## 2020-01-11 DIAGNOSIS — M5432 Sciatica, left side: Secondary | ICD-10-CM

## 2020-01-11 MED ORDER — MELOXICAM 7.5 MG PO TABS: 7.5000 mg | ORAL_TABLET | Freq: Every day | ORAL | 0 refills | Status: DC | PRN

## 2020-01-11 NOTE — Patient Instructions (Signed)
Thank you for taking time to come for your Medicare Wellness Visit. I appreciate your ongoing commitment to your health goals. Please review the following plan we discussed and let me know if I can assist you in the future.  Lori Manninen LPN  Preventive Care 65 Years and Older, Female Preventive care refers to lifestyle choices and visits with your health care provider that can promote health and wellness. This includes:  A yearly physical exam. This is also called an annual well check.  Regular dental and eye exams.  Immunizations.  Screening for certain conditions.  Healthy lifestyle choices, such as diet and exercise. What can I expect for my preventive care visit? Physical exam Your health care provider will check:  Height and weight. These may be used to calculate body mass index (BMI), which is a measurement that tells if you are at a healthy weight.  Heart rate and blood pressure.  Your skin for abnormal spots. Counseling Your health care provider may ask you questions about:  Alcohol, tobacco, and drug use.  Emotional well-being.  Home and relationship well-being.  Sexual activity.  Eating habits.  History of falls.  Memory and ability to understand (cognition).  Work and work environment.  Pregnancy and menstrual history. What immunizations do I need?  Influenza (flu) vaccine  This is recommended every year. Tetanus, diphtheria, and pertussis (Tdap) vaccine  You may need a Td booster every 10 years. Varicella (chickenpox) vaccine  You may need this vaccine if you have not already been vaccinated. Zoster (shingles) vaccine  You may need this after age 60. Pneumococcal conjugate (PCV13) vaccine  One dose is recommended after age 65. Pneumococcal polysaccharide (PPSV23) vaccine  One dose is recommended after age 65. Measles, mumps, and rubella (MMR) vaccine  You may need at least one dose of MMR if you were born in 1957 or later. You may also  need a second dose. Meningococcal conjugate (MenACWY) vaccine  You may need this if you have certain conditions. Hepatitis A vaccine  You may need this if you have certain conditions or if you travel or work in places where you may be exposed to hepatitis A. Hepatitis B vaccine  You may need this if you have certain conditions or if you travel or work in places where you may be exposed to hepatitis B. Haemophilus influenzae type b (Hib) vaccine  You may need this if you have certain conditions. You may receive vaccines as individual doses or as more than one vaccine together in one shot (combination vaccines). Talk with your health care provider about the risks and benefits of combination vaccines. What tests do I need? Blood tests  Lipid and cholesterol levels. These may be checked every 5 years, or more frequently depending on your overall health.  Hepatitis C test.  Hepatitis B test. Screening  Lung cancer screening. You may have this screening every year starting at age 55 if you have a 30-pack-year history of smoking and currently smoke or have quit within the past 15 years.  Colorectal cancer screening. All adults should have this screening starting at age 50 and continuing until age 75. Your health care provider may recommend screening at age 45 if you are at increased risk. You will have tests every 1-10 years, depending on your results and the type of screening test.  Diabetes screening. This is done by checking your blood sugar (glucose) after you have not eaten for a while (fasting). You may have this done every 1-3   years.  Mammogram. This may be done every 1-2 years. Talk with your health care provider about how often you should have regular mammograms.  BRCA-related cancer screening. This may be done if you have a family history of breast, ovarian, tubal, or peritoneal cancers. Other tests  Sexually transmitted disease (STD) testing.  Bone density scan. This is done  to screen for osteoporosis. You may have this done starting at age 94. Follow these instructions at home: Eating and drinking  Eat a diet that includes fresh fruits and vegetables, whole grains, lean protein, and low-fat dairy products. Limit your intake of foods with high amounts of sugar, saturated fats, and salt.  Take vitamin and mineral supplements as recommended by your health care provider.  Do not drink alcohol if your health care provider tells you not to drink.  If you drink alcohol: ? Limit how much you have to 0-1 drink a day. ? Be aware of how much alcohol is in your drink. In the [place] one drink equals one 12 oz bottle of beer (355 mL), one 5 oz glass of wine (148 mL), or one 1 oz glass of hard liquor (44 mL). Lifestyle  Take daily care of your teeth and gums.  Stay active. Exercise for at least 30 minutes on 5 or more days each week.  Do not use any products that contain nicotine or tobacco, such as cigarettes, e-cigarettes, and chewing tobacco. If you need help quitting, ask your health care provider.  If you are sexually active, practice safe sex. Use a condom or other form of protection in order to prevent STIs (sexually transmitted infections).  Talk with your health care provider about taking a low-dose aspirin or statin. What's next?  Go to your health care provider once a year for a well check visit.  Ask your health care provider how often you should have your eyes and teeth checked.  Stay up to date on all vaccines. This information is not intended to replace advice given to you by your health care provider. Make sure you discuss any questions you have with your health care provider. Document Revised: 01/06/2018 Document Reviewed: 01/06/2018 Elsevier Patient Education  2020 [employer].

## 2020-01-11 NOTE — Telephone Encounter (Signed)
Lori Mooney has an appointment 2-3 @ 8:40 she would like to know if she can get a refill of her Meloxicam she has a couple pills left but would like some on hand to get her to her appointment.  Last sent 11/24/2019    She would also like to come in for labs earlier that week of her appointment on 2-3

## 2020-01-11 NOTE — Telephone Encounter (Signed)
Temporary refill provided..  As far as labs, it may be best to see what [facility] checks initially so we do not duplicate labs.

## 2020-01-11 NOTE — Addendum Note (Signed)
Addended by: Merri Ray R on: 01/11/2020 01:48 PM   Modules accepted: Orders

## 2020-01-11 NOTE — Progress Notes (Signed)
Presents today for [employer] Visit   Date of last exam:11-24-2019  Interpreter used for this visit? No  I connected with  Lori Mooney on 01/11/20 by a telephone application and verified that I am speaking with the correct person using two identifiers.   I discussed the limitations of evaluation and management by telemedicine. The patient expressed understanding and agreed to proceed.  Patient location: home  Provider location: in office  I provided 20 minutes of non face - to - face time during this encounter.   Patient Care Team: [name], MD as PCP - General (Family Medicine) Rutherford Guys, MD as Consulting Physician (Ophthalmology) Jerline Pain, MD as Consulting Physician (Cardiology) Ronald Lobo, MD as Consulting Physician (Gastroenterology) Particia Nearing, MD as Attending Physician (Dermatology)   Other items to address today:   Discussed Eye/Dental Discussed Immunizations Follow up scheduled 2-3 @ 8:40 Lori Mooney   Other Screening: Last screening for diabetes: 11/20/2019 Last lipid screening: 11/20/2019  ADVANCE DIRECTIVES: Discussed: yes On File: yes Materials Provided no  Immunization status:  Immunization History  Administered Date(s) Administered  . Influenza Nasal 10/16/2017  . Influenza Whole 11/07/2011  . Influenza,inj,Quad PF,6+ Mos 10/28/2012  . Influenza-Unspecified 10/26/2013, 10/15/2014, 11/10/2015, 10/19/2016, 11/05/2019  . PFIZER SARS-COV-2 Vaccination 03/04/2019, 03/29/2019, 12/09/2019  . Pneumococcal Conjugate-13 10/30/2014  . Pneumococcal Polysaccharide-23 07/20/2016  . Td 03/20/2009  . Tdap 11/24/2019     There are no preventive care reminders to display for this patient.   Functional Status Survey: Is the patient deaf or have difficulty hearing?: No Does the patient have difficulty seeing, even when wearing glasses/contacts?: No Does the patient have difficulty concentrating, remembering,  or making decisions?: No Does the patient have difficulty walking or climbing stairs?: Yes (one step at a time) Does the patient have difficulty dressing or bathing?: No Does the patient have difficulty doing errands alone such as visiting a doctor's office or shopping?: No   6CIT Screen 01/11/2020 10/19/2017  What Year? 0 points 0 points  What month? 0 points 0 points  What time? 0 points 0 points  Count back from 20 0 points 0 points  Months in reverse 2 points 0 points  Repeat phrase 0 points 0 points  Total Score 2 0      Flowsheet Row Clinical Support from 01/11/2020 in [place] at [place]  AUDIT-C Score 6       Home Environment:     Lives in one story home No scattered rugs No grab bars in bathroom Walk in shower Adequate lighting/ no clutter No trouble climbing stairs goes one step at a time   Patient Active Problem List   Diagnosis Date Noted  . H/O total knee replacement, left 08/24/2018  . Osteoarthritis of spine with radiculopathy, lumbar region 06/25/2017  . Lumbar pain 06/25/2017  . Spinal stenosis of lumbar region without neurogenic claudication 06/25/2017  . Sciatica of right side 06/19/2017  . GERD (gastroesophageal reflux disease) 09/19/2013  . Vertigo 03/02/2012  . Hypercholesterolemia 10/08/2010  . Benign hypertensive heart disease without heart failure 10/08/2010  . Obesity 10/08/2010     Past Medical History:  Diagnosis Date  . Allergy   . 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 Cataract   . Exogenous obesity   . GERD (gastroesophageal reflux disease)   . Hyperlipidemia    hypercholesterolemia  . Hypertension      Past Surgical History:  Procedure Laterality Date  .  CATARACT EXTRACTION W/ INTRAOCULAR LENS IMPLANT Left   . DILITATION & CURRETTAGE/HYSTROSCOPY WITH VERSAPOINT RESECTION N/A 08/25/2012   Procedure: DILATATION & CURETTAGE/HYSTEROSCOPY WITH VERSAPOINT RESECTION;  Surgeon: Princess Bruins, MD;  Location:  [facility];  Service: Gynecology;  Laterality: N/A;  . EYE SURGERY    . TONSILLECTOMY    . 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   Procedure: TOTAL KNEE ARTHROPLASTY;  Surgeon: [name], MD;  Location: [facility];  Service: Orthopedics;  Laterality: Left;  168min  . TUBAL LIGATION       Family History  Problem Relation Age of Onset  . Hyperlipidemia Mother   . Heart disease Mother   . Hypertension Mother   . Hypertension Father   . Hyperlipidemia Father   . Hyperlipidemia Sister   . Hypertension Sister      Social History   Socioeconomic History  . Marital status: Married    Spouse name: Not on file  . Number of children: Not on file  . Years of education: Not on file  . Highest education level: Not on file  Occupational History  . Occupation: Retired Therapist, sports  Tobacco Use  . Smoking status: Never Smoker  . Smokeless tobacco: Never Used  Vaping Use  . Vaping Use: Never used  Substance and Sexual Activity  . Alcohol use: Yes    Alcohol/week: 1.0 - 2.0 standard drink    Types: 1 - 2 Standard drinks or equivalent per week  . Drug use: No  . Sexual activity: Yes  Other Topics Concern  . Not on file  Social History Narrative   Married. Education: [employer].   Social Determinants of Health   Financial Resource Strain: Not on file  Food Insecurity: Not on file  Transportation Needs: Not on file  Physical Activity: Not on file  Stress: Not on file  Social Connections: Not on file  Intimate Partner Violence: Not on file     Allergies  Allergen Reactions  . Shellfish Allergy Hives  . Strawberry Extract Hives     Prior to Admission medications   Medication Sig Start Date End Date Taking? Authorizing Provider  acetaminophen (TYLENOL) 325 MG tablet Take 650 mg by mouth every 6 (six) hours as needed.   Yes [provider]  atorvastatin (LIPITOR) 20 MG tablet Take 1 tablet (20 mg total) by mouth daily. 11/24/19  Yes [name], MD  Cholecalciferol  (VITAMIN D-3) 1000 UNITS CAPS Take 1,000 Units by mouth daily.   Yes [provider]  famotidine (PEPCID) 10 MG tablet Take 10 mg by mouth at bedtime.    Yes [provider]  lisinopril-hydrochlorothiazide (ZESTORETIC) 20-12.5 MG tablet Take 1 tablet by mouth daily. 11/24/19  Yes [name], MD  loratadine (CLARITIN) 10 MG tablet Take 10 mg by mouth daily as needed for allergies.    Yes [provider]  magnesium 30 MG tablet Take 250 mg by mouth daily.    Yes [provider]  meloxicam (MOBIC) 7.5 MG tablet Take 1 tablet (7.5 mg total) by mouth daily as needed for pain. 11/24/19  Yes [name], MD     Depression screen [facility] 2/9 01/11/2020 11/24/2019 05/26/2019 05/02/2019 10/31/2018  Decreased Interest 0 0 0 0 0  Down, Depressed, Hopeless 0 0 0 0 0  PHQ - 2 Score 0 0 0 0 0     Fall Risk  01/11/2020 11/24/2019 05/26/2019 05/02/2019 10/31/2018  Falls in the past year? 0 0 0 0 0  Number  falls in past yr: 0 - 0 0 0  Injury with Fall? 0 - 0 0 0  Follow up Falls evaluation completed;Education provided Falls evaluation completed Falls evaluation completed Falls evaluation completed Falls evaluation completed      PHYSICAL EXAM: BP 129/85 Comment: taken from a previous visit/ patient not in clinic  Ht 5\' 3"  (1.6 m)   Wt 208 lb (94.3 kg)   BMI 36.85 kg/m    Wt Readings from Last 3 Encounters:  01/11/20 208 lb (94.3 kg)  11/24/19 208 lb (94.3 kg)  05/26/19 206 lb (93.4 kg)      Education/Counseling provided regarding diet and exercise, prevention of chronic diseases, smoking/tobacco cessation, if applicable, and reviewed "Covered Medicare Preventive Services."

## 2020-01-16 NOTE — Progress Notes (Signed)
Note reviewed, and agree with documentation and plan. I was available in office during the time of this wellness visit and available for questions or concerns regarding visit or follow-up issues to be addressed. Signed,   Merri Ray, MD [facility] [place].  01/16/20 10:25 PM

## 2020-01-17 NOTE — Telephone Encounter (Signed)
Medicare annual wellness done

## 2020-01-30 ENCOUNTER — Ambulatory Visit (INDEPENDENT_AMBULATORY_CARE_PROVIDER_SITE_OTHER): Payer: Medicare Other | Admitting: Family Medicine

## 2020-01-30 ENCOUNTER — Other Ambulatory Visit: Payer: Self-pay

## 2020-01-30 ENCOUNTER — Encounter (INDEPENDENT_AMBULATORY_CARE_PROVIDER_SITE_OTHER): Payer: Self-pay | Admitting: Family Medicine

## 2020-01-30 VITALS — BP 121/74 | HR 87 | Temp 97.6°F | Ht 62.0 in | Wt 203.0 lb

## 2020-01-30 DIAGNOSIS — E781 Pure hyperglyceridemia: Secondary | ICD-10-CM | POA: Diagnosis not present

## 2020-01-30 DIAGNOSIS — E7800 Pure hypercholesterolemia, unspecified: Secondary | ICD-10-CM | POA: Diagnosis not present

## 2020-01-30 DIAGNOSIS — I10 Essential (primary) hypertension: Secondary | ICD-10-CM | POA: Diagnosis not present

## 2020-01-30 DIAGNOSIS — R5383 Other fatigue: Secondary | ICD-10-CM

## 2020-01-30 DIAGNOSIS — R7303 Prediabetes: Secondary | ICD-10-CM | POA: Diagnosis not present

## 2020-01-30 DIAGNOSIS — R0602 Shortness of breath: Secondary | ICD-10-CM

## 2020-01-30 DIAGNOSIS — M1990 Unspecified osteoarthritis, unspecified site: Secondary | ICD-10-CM

## 2020-01-30 DIAGNOSIS — Z1331 Encounter for screening for depression: Secondary | ICD-10-CM

## 2020-01-30 DIAGNOSIS — E559 Vitamin D deficiency, unspecified: Secondary | ICD-10-CM | POA: Diagnosis not present

## 2020-01-30 DIAGNOSIS — E7849 Other hyperlipidemia: Secondary | ICD-10-CM | POA: Diagnosis not present

## 2020-01-30 DIAGNOSIS — Z6837 Body mass index (BMI) 37.0-37.9, adult: Secondary | ICD-10-CM

## 2020-01-30 DIAGNOSIS — Z0289 Encounter for other administrative examinations: Secondary | ICD-10-CM

## 2020-01-31 LAB — COMPREHENSIVE METABOLIC PANEL
ALT: 19 IU/L (ref 0–32)
AST: 14 IU/L (ref 0–40)
Albumin/Globulin Ratio: 2.2 (ref 1.2–2.2)
Albumin: 4.8 g/dL — ABNORMAL HIGH (ref 3.7–4.7)
Alkaline Phosphatase: 85 IU/L (ref 44–121)
BUN/Creatinine Ratio: 18 (ref 12–28)
BUN: 15 mg/dL (ref 8–27)
Bilirubin Total: 0.4 mg/dL (ref 0.0–1.2)
CO2: 22 mmol/L (ref 20–29)
Calcium: 9.9 mg/dL (ref 8.7–10.3)
Chloride: 100 mmol/L (ref 96–106)
Creatinine, Ser: 0.82 mg/dL (ref 0.57–1.00)
GFR calc Af Amer: 82 mL/min/{1.73_m2} (ref >59)
GFR calc non Af Amer: 71 mL/min/{1.73_m2} (ref >59)
Globulin, Total: 2.2 g/dL (ref 1.5–4.5)
Glucose: 86 mg/dL (ref 65–99)
Potassium: 4.6 mmol/L (ref 3.5–5.2)
Sodium: 139 mmol/L (ref 134–144)
Total Protein: 7 g/dL (ref 6.0–8.5)

## 2020-01-31 LAB — LIPID PANEL
Chol/HDL Ratio: 3.9 ratio (ref 0.0–4.4)
Cholesterol, Total: 235 mg/dL — ABNORMAL HIGH (ref 100–199)
HDL: 60 mg/dL (ref >39)
LDL Chol Calc (NIH): 122 mg/dL — ABNORMAL HIGH (ref 0–99)
Triglycerides: 304 mg/dL — ABNORMAL HIGH (ref 0–149)
VLDL Cholesterol Cal: 53 mg/dL — ABNORMAL HIGH (ref 5–40)

## 2020-01-31 LAB — HEMOGLOBIN A1C
Est. average glucose Bld gHb Est-mCnc: 126 mg/dL
Hgb A1c MFr Bld: 6 % — ABNORMAL HIGH (ref 4.8–5.6)

## 2020-01-31 LAB — CBC WITH DIFFERENTIAL/PLATELET
Basophils Absolute: 0.1 10*3/uL (ref 0.0–0.2)
Basos: 1 %
EOS (ABSOLUTE): 0.2 10*3/uL (ref 0.0–0.4)
Eos: 3 %
Hematocrit: 47.5 % — ABNORMAL HIGH (ref 34.0–46.6)
Hemoglobin: 16 g/dL — ABNORMAL HIGH (ref 11.1–15.9)
Immature Grans (Abs): 0 10*3/uL (ref 0.0–0.1)
Immature Granulocytes: 0 %
Lymphocytes Absolute: 1.8 10*3/uL (ref 0.7–3.1)
Lymphs: 26 %
MCH: 30 pg (ref 26.6–33.0)
MCHC: 33.7 g/dL (ref 31.5–35.7)
MCV: 89 fL (ref 79–97)
Monocytes Absolute: 0.5 10*3/uL (ref 0.1–0.9)
Monocytes: 7 %
Neutrophils Absolute: 4.4 10*3/uL (ref 1.4–7.0)
Neutrophils: 63 %
Platelets: 336 10*3/uL (ref 150–450)
RBC: 5.33 x10E6/uL — ABNORMAL HIGH (ref 3.77–5.28)
RDW: 13.1 % (ref 11.7–15.4)
WBC: 7 10*3/uL (ref 3.4–10.8)

## 2020-01-31 LAB — T3: T3, Total: 149 ng/dL (ref 71–180)

## 2020-01-31 LAB — T4, FREE: Free T4: 1.41 ng/dL (ref 0.82–1.77)

## 2020-01-31 LAB — VITAMIN D 25 HYDROXY (VIT D DEFICIENCY, FRACTURES): Vit D, 25-Hydroxy: 40.8 ng/mL (ref 30.0–100.0)

## 2020-01-31 LAB — INSULIN, RANDOM: INSULIN: 20.7 u[IU]/mL (ref 2.6–24.9)

## 2020-01-31 LAB — TSH: TSH: 2.59 u[IU]/mL (ref 0.450–4.500)

## 2020-02-01 NOTE — Progress Notes (Signed)
Dear Dr. Merri Ray,   Thank you for referring Lori Mooney to our clinic. The following note includes my evaluation and treatment recommendations.  Chief Complaint:   OBESITY Lori Mooney (MR# [id number]) is a 75 y.o. female who presents for evaluation and treatment of obesity and related comorbidities. Current BMI is Body mass index is 37.13 kg/m. Lori Mooney has been struggling with her weight for many years and has been unsuccessful in either losing weight, maintaining weight loss, or reaching her healthy weight goal.  Lori Mooney is currently in the action stage of change and ready to dedicate time achieving and maintaining a healthier weight. Lori Mooney is interested in becoming our patient and working on intensive lifestyle modifications including (but not limited to) diet and exercise for weight loss.  Lori Mooney has a BS in Nursing and is a retired [employer] manager. She lives with her husband, Lori Mooney. She has tried [employer] in the past. She craves cookies, chips, and chocolate, especially in the afternoons. She snacks on nuts and crackers. Lori Mooney was referred to [place] by her PCP, Dr. Carlota Raspberry. She has several questions about labs done by Dr. Carlota Raspberry in October. I reassured patient that we will be rechecking them to obtain current baseline, especially after the holidays and we well discuss them all in person at her next OV.  Lori Mooney's habits were reviewed today and are as follows: Her family eats meals together, she thinks her family will eat healthier with her, her desired weight loss is 43 lbs, she has been heavy most of her life, she started gaining weight in 2010, her heaviest weight ever was 210 pounds, she has significant food cravings issues, she is frequently drinking liquids with calories and she struggles with emotional eating.  This is the [facility] visit at [facility]  The patient's NEW PATIENT PACKET that they filled out prior to today's office visit was reviewed at length  and information from that paperwork was included within the following office visit note.    Included in the packet: current and past health history, medications, allergies, ROS, gynecologic history (women only), surgical history, family history, social history, weight history, weight loss surgery history (for those that have had weight loss surgery), nutritional evaluation, mood and food questionnaire along with a depression screening (PHQ9) on all patients, an Epworth questionnaire, sleep habits questionnaire, patient life and health improvement goals questionnaire. These will all be scanned into the patient's chart under media.   During the visit, I independently reviewed the patient's EKG, bioimpedance scale results, and indirect calorimeter results. I used this information to tailor a meal plan for the patient that will help Lori Mooney to lose weight and will improve her obesity-related conditions going forward.  I performed a medically necessary appropriate examination and/or evaluation. I discussed the assessment and treatment plan with the patient. The patient was provided an opportunity to ask questions and all were answered. The patient agreed with the plan and demonstrated an understanding of the instructions. Labs were ordered today (unless patient declined them) and will be reviewed with the patient at our next visit unless more critical results need to be addressed immediately. Clinical information was updated and documented in the EMR.  Time spent on visit including pre-visit chart review and post-visit care was estimated to be 70 minutes.  A separate 15 minutes was spent on risk counseling (see above/below).    Depression Screen Lori Mooney's Food and Mood (modified PHQ-9) score was 5.  Depression screen Lori Mooney  01/30/2020  Decreased Interest 1  Down, Depressed, Hopeless 1  PHQ - 2 Score 2  Altered sleeping 0  Tired, decreased energy 1  Change in appetite 1  Feeling bad or failure about  yourself  1  Trouble concentrating 0  Moving slowly or fidgety/restless 0  Suicidal thoughts 0  PHQ-9 Score 5  Difficult doing work/chores Not difficult at all    Assessment/Plan:   1. SOBOE (shortness of breath on exertion) Lori Mooney notes increasing shortness of breath with exercising and seems to be worsening over time with weight gain. She notes getting out of breath sooner with activity than she used to. This has gotten worse recently. Lori Mooney denies shortness of breath at rest or orthopnea.  Plan: Lori Mooney does feel that she gets out of breath more easily that she used to when she exercises. Lori Mooney's shortness of breath appears to be obesity related and exercise induced. She has agreed to work on weight loss and gradually increase exercise to treat her exercise induced shortness of breath. Will continue to monitor closely. Check Labs today.  Orders - CBC with Differential/Platelet - Comprehensive metabolic panel - Hemoglobin A1c - Insulin, random - Lipid panel - T3 - T4, free - TSH - VITAMIN D 25 Hydroxy (Vit-D Deficiency, Fractures)  2. Other fatigue Lori Mooney admits to daytime somnolence and denies waking up still tired. Patent has a history of symptoms of daytime fatigue. Lori Mooney generally gets 8 or 9 hours of sleep per night, and states that she has generally restful sleep. Snoring is present. Apneic episodes are not present. Epworth Sleepiness Score is 3.  Plan: Danyal does feel that her weight is causing her energy to be lower than it should be. Fatigue may be related to obesity, depression or many other causes. Labs will be ordered, and in the meanwhile, [name] will focus on self care including making healthy food choices, increasing physical activity and focusing on stress reduction. Check labs today.  Orders - EKG 12-Lead - CBC with Differential/Platelet - Comprehensive metabolic panel - Hemoglobin A1c - Insulin, random - Lipid panel - T3 - T4, free - TSH - VITAMIN D 25  Hydroxy (Vit-D Deficiency, Fractures)  3. Essential hypertension Lori Mooney is prescribed lisinopril-HCTZ.   Plan: We will check labs today. Counseling done. Kinslea's blood pressure is at goal. We will monitor closely alongside PCP as patient progresses on her weight loss journey.   Orders - CBC with Differential/Platelet - Comprehensive metabolic panel - Hemoglobin A1c - Insulin, random - Lipid panel - T3 - T4, free - TSH - VITAMIN D 25 Hydroxy (Vit-D Deficiency, Fractures)  4. Other hyperlipidemia, with TG's Lori Mooney is prescribed Lipitor daily. She denies issues with the medication and is treated by PCP, Dr. Carlota Raspberry.  Plan: Continue current treatment plan, per PCP. We will monitor alongside PCP as patient progresses on her weight loss journey. We will check labs today.  Orders - CBC with Differential/Platelet - Comprehensive metabolic panel - Hemoglobin A1c - Insulin, random - Lipid panel - T3 - T4, free - TSH - VITAMIN D 25 Hydroxy (Vit-D Deficiency, Fractures)  5. Pre-diabetes Lori Mooney's A1c in 2016 was 6.0 and in October 2021 it was also 6.0. She is not on medication, as she is diet and lifestyle controlled.  Plan: We will check labs today. Lori Mooney will start prudent nutritional plan and weight loss. Counseling on disease and prevention of diabetes mellitus performed.  Orders - CBC with Differential/Platelet - Comprehensive metabolic panel - Hemoglobin A1c - Insulin, random -  Lipid panel - T3 - T4, free - TSH - VITAMIN D 25 Hydroxy (Vit-D Deficiency, Fractures)  6. Other type of osteoarthritis, unspecified site, with joint pain Status post left total knee arthroplasty in 2020. Lori Mooney has severe right osteoarthritis and takes Mobic as needed, but mostly takes OTC Tylenol.  Plan: Patient advised to follow up with ortho as needed and/or PCP for pain management.    7. Vitamin D deficiency Lori Mooney is currently taking OTC vitamin D 1,000 units each day. She denies nausea,  vomiting or muscle weakness.  Plan: We will check labs today. Continue current treatment plan of OTC supplement until results are discussed at next OV.  Orders - VITAMIN D 25 Hydroxy (Vit-D Deficiency, Fractures)  8. Depression screening Lori Mooney had a positive depression screening. Depression is commonly associated with obesity and often results in emotional eating behaviors. We will monitor this closely and work on CBT to help improve the non-hunger eating patterns. Referral to Psychology may be required if no improvement is seen as she continues in our clinic.  9. Class 2 severe obesity with serious comorbidity and body mass index (BMI) of 37.0 to 37.9 in adult, unspecified obesity type Lori Mooney) Lori Mooney is currently in the action stage of change and her goal is to continue with weight loss efforts. I recommend Lori Mooney begin the structured treatment plan as follows:  She has agreed to the Category 1 Plan.  Exercise goals: As is   Behavioral modification strategies: increasing lean protein intake, decreasing simple carbohydrates, increasing water intake, meal planning and cooking strategies, keeping healthy foods in the home and planning for success.  She was informed of the importance of frequent follow-up visits to maximize her success with intensive lifestyle modifications for her multiple health conditions. She was informed we would discuss her lab results at her next visit unless there is a critical issue that needs to be addressed sooner. Lori Mooney agreed to keep her next visit at the agreed upon time to discuss these results.  Objective:   Blood pressure 121/74, pulse 87, temperature 97.6 F (36.4 C), height 5\' 2"  (1.575 m), weight 203 lb (92.1 kg), SpO2 97 %. Body mass index is 37.13 kg/m.  EKG: Normal sinus rhythm, rate 91.  Indirect Calorimeter completed today shows a VO2 of 188 and a REE of 1306.  Her calculated basal metabolic rate is [id number] thus her basal metabolic rate is worse than  expected.  General: Cooperative, alert, well developed, in no acute distress. HEENT: Conjunctivae and lids unremarkable. Cardiovascular: Regular rhythm.  Lungs: Normal work of breathing. Neurologic: No focal deficits.   Lab Results  Component Value Date   CREATININE 0.82 01/30/2020   BUN 15 01/30/2020   NA 139 01/30/2020   K 4.6 01/30/2020   CL 100 01/30/2020   CO2 22 01/30/2020   Lab Results  Component Value Date   ALT 19 01/30/2020   AST 14 01/30/2020   ALKPHOS 85 01/30/2020   BILITOT 0.4 01/30/2020   Lab Results  Component Value Date   HGBA1C 6.0 (H) 01/30/2020   HGBA1C 6.0 (H) 11/20/2019   HGBA1C 5.9 (H) 05/02/2019   HGBA1C 5.9 (H) 08/10/2018   HGBA1C 6.0 (H) 10/19/2017   Lab Results  Component Value Date   INSULIN 20.7 01/30/2020   Lab Results  Component Value Date   TSH 2.590 01/30/2020   Lab Results  Component Value Date   CHOL 235 (H) 01/30/2020   HDL 60 01/30/2020   LDLCALC 122 (H) 01/30/2020  LDLDIRECT 95.2 09/06/2012   TRIG 304 (H) 01/30/2020   CHOLHDL 3.9 01/30/2020   Lab Results  Component Value Date   WBC 7.0 01/30/2020   HGB 16.0 (H) 01/30/2020   HCT 47.5 (H) 01/30/2020   MCV 89 01/30/2020   PLT 336 01/30/2020   No results found for: IRON, TIBC, FERRITIN  Attestation Statements:   Reviewed by clinician on day of visit: allergies, medications, problem list, medical history, surgical history, family history, social history, and previous encounter notes.  Time spent on visit including pre-visit chart review and post-visit charting and care was 60 minutes.   Edmund Lori Mooney, am acting as Energy manager for [employer], DO.  I have reviewed the above documentation for accuracy and completeness, and I agree with the above. Carlye Grippe, D.O.  The 21st Century Cures Act was signed into law in 2016 which includes the topic of electronic health records.  This provides immediate access to information in MyChart.  This  includes consultation notes, operative notes, office notes, lab results and pathology reports.  If you have any questions about what you read please let us know at your next visit so we can discuss your concerns and take corrective action if need be.  We are right here with you.

## 2020-02-06 ENCOUNTER — Telehealth: Payer: Self-pay | Admitting: Family Medicine

## 2020-02-06 NOTE — Telephone Encounter (Signed)
Patient started [facility] is wanting Dr. Carlota Raspberry to take a look at her most recent labs. rom that office  Patient is wanting to know if she needs to change the 02/29/2020 appt with him to the 1st part of April.    Please advise patient of new lab date and appt date /   patient is not sure if medicare will pay for these visits

## 2020-02-07 NOTE — Telephone Encounter (Signed)
That is fine to push visit with me to April as under care of Dr. Raliegh Scarlet and recent labs. Thanks.

## 2020-02-13 ENCOUNTER — Encounter (INDEPENDENT_AMBULATORY_CARE_PROVIDER_SITE_OTHER): Payer: Self-pay

## 2020-02-13 ENCOUNTER — Telehealth (INDEPENDENT_AMBULATORY_CARE_PROVIDER_SITE_OTHER): Payer: Self-pay

## 2020-02-13 ENCOUNTER — Other Ambulatory Visit: Payer: Self-pay

## 2020-02-13 ENCOUNTER — Telehealth: Payer: Self-pay | Admitting: Family Medicine

## 2020-02-13 ENCOUNTER — Telehealth (INDEPENDENT_AMBULATORY_CARE_PROVIDER_SITE_OTHER): Payer: Medicare Other | Admitting: Family Medicine

## 2020-02-13 ENCOUNTER — Encounter (INDEPENDENT_AMBULATORY_CARE_PROVIDER_SITE_OTHER): Payer: Self-pay | Admitting: Family Medicine

## 2020-02-13 VITALS — BP 120/78 | Wt 204.0 lb

## 2020-02-13 DIAGNOSIS — R7303 Prediabetes: Secondary | ICD-10-CM | POA: Diagnosis not present

## 2020-02-13 DIAGNOSIS — E559 Vitamin D deficiency, unspecified: Secondary | ICD-10-CM

## 2020-02-13 DIAGNOSIS — Z6837 Body mass index (BMI) 37.0-37.9, adult: Secondary | ICD-10-CM | POA: Diagnosis not present

## 2020-02-13 DIAGNOSIS — I10 Essential (primary) hypertension: Secondary | ICD-10-CM | POA: Diagnosis not present

## 2020-02-13 DIAGNOSIS — R9431 Abnormal electrocardiogram [ECG] [EKG]: Secondary | ICD-10-CM | POA: Diagnosis not present

## 2020-02-13 DIAGNOSIS — E7849 Other hyperlipidemia: Secondary | ICD-10-CM

## 2020-02-13 NOTE — Telephone Encounter (Signed)
Verbal consent obtained to conduct video visit, via telehealth 

## 2020-02-13 NOTE — Telephone Encounter (Signed)
Patient states she needs labs for visit scheduled for 04/29/2020.

## 2020-02-14 DIAGNOSIS — E7849 Other hyperlipidemia: Secondary | ICD-10-CM | POA: Insufficient documentation

## 2020-02-14 DIAGNOSIS — R9431 Abnormal electrocardiogram [ECG] [EKG]: Secondary | ICD-10-CM | POA: Insufficient documentation

## 2020-02-14 NOTE — Progress Notes (Signed)
TeleHealth Visit:  Due to the COVID-19 pandemic, this visit was completed with telemedicine (audio/video) technology to reduce patient and provider exposure as well as to preserve personal protective equipment.   Lori Mooney has verbally consented to this TeleHealth visit. The patient is located at home, the provider is located at the [facility] office. The participants in this visit include the listed provider and patient. The visit was conducted today via video.  Chief Complaint: OBESITY Lori Mooney is here to discuss her progress with her obesity treatment plan along with follow-up of her obesity related diagnoses. Lori Mooney is on the Category 1 Plan and states she is following her eating plan approximately 95% of the time. Lori Mooney states she is exercising 0 minutes 0 times per week.  Today's visit was #: 2 Starting weight: 203 lbs Starting date: 01/30/2020  Interim History: This is Lori Mooney's first follow up. She has questions about specifics of meal plan and also her lab results/ECG. She feels she maintained weight since last OV but her scale at home is off, per patient. She denies hunger and cravings. She has not problem getting in all her foods.   Plan: Send patient handouts on Category 1 breakfast options and handout on frozen meals. Patient was told it is okay to have 1 cup of Special K protein cereal with 1 cup of Fairlife skim milk now or 1 100 calorie pack of low sugar oatmeal with 1 cup of Fairlife skim milk.  Assessment/Plan:   1. Other hyperlipidemia Worsening. Discussed labs with patient today. Lori Mooney consistently takes Lipitor 20 mg daily. She denies issue or concerns. Last lab check patient was on same dose statin but is eating better.  Lori Mooney has hyperlipidemia and has been trying to improve her cholesterol levels with intensive lifestyle modification including a low saturated fat diet, exercise and weight loss. She denies any chest pain, claudication or myalgias.  Lab  Results  Component Value Date   ALT 19 01/30/2020   AST 14 01/30/2020   ALKPHOS 85 01/30/2020   BILITOT 0.4 01/30/2020   Lab Results  Component Value Date   CHOL 235 (H) 01/30/2020   HDL 60 01/30/2020   LDLCALC 122 (H) 01/30/2020   LDLDIRECT 95.2 09/06/2012   TRIG 304 (H) 01/30/2020   CHOLHDL 3.9 01/30/2020   Plan: After a long discussion with patient regarding abnormal labs, we decided to stay at same dose Lipitor (since LDL was 82 on some dose) and work on diet and exercise. Recheck 3-4 months. Extensive counseling done. Cardiovascular risk and specific lipid/LDL goals reviewed.  We discussed several lifestyle modifications today and Lori Mooney will continue to work on diet, exercise and weight loss efforts. Orders and follow up as documented in patient record.   Counseling Intensive lifestyle modifications are the first line treatment for this issue. . Dietary changes: Increase soluble fiber. Decrease simple carbohydrates. . Exercise changes: Moderate to vigorous-intensity aerobic activity 150 minutes per week if tolerated. . Lipid-lowering medications: see documented in medical record.   2. Essential hypertension Lori Mooney denies issues or concerns. BP at home is controlled- 120's/70's. She is prescribed Zestoretic.  Review: taking medications as instructed, no medication side effects noted, no chest pain on exertion, no dyspnea on exertion, no swelling of ankles.   BP Readings from Last 3 Encounters:  02/13/20 120/78  01/30/20 121/74  01/11/20 129/85   Lab Results  Component Value Date   WBC 7.0 01/30/2020   HGB 16.0 (H) 01/30/2020   HCT 47.5 (H)  01/30/2020   MCV 89 01/30/2020   PLT 336 01/30/2020    Ref. Range 01/30/2020 10:59  TSH Latest Ref Range: 0.450 - 4.500 uIU/mL 2.590  Triiodothyronine (T3) Latest Ref Range: 71 - 180 ng/dL 149  T4,Free(Direct) Latest Ref Range: 0.82 - 1.77 ng/dL 1.41    Plan: Continue meds. Serum creatinine and other labs are reassuring. Prudent  nutritional plan (focusing on low salt), increase exercise eventually and weight loss to help BP. Continue home blood pressure monitoring twice a week.  3. Pre-diabetes Worsening. Discussed labs with patient today. Elevated fasting insulin and A1c 6.0, as well. Was 6.0 over 6 years ago also. Prior A1c had been 5.9.  Lori Mooney has a diagnosis of prediabetes based on her elevated HgA1c and was informed this puts her at greater risk of developing diabetes. She continues to work on diet and exercise to decrease her risk of diabetes. She denies nausea or hypoglycemia.  Lab Results  Component Value Date   HGBA1C 6.0 (H) 01/30/2020   Lab Results  Component Value Date   INSULIN 20.7 01/30/2020   Plan: Counseling performed on decreasing simple carbs, following meal plan, weight loss. Advised of importance of protein in BS regulation. Decrease BMI to improve insulin sensitivity. Lori Mooney will continue to work on weight loss, exercise, and decreasing simple carbohydrates to help decrease the risk of diabetes.   4. Vitamin D deficiency Lori Mooney's Vitamin D level was 40.8 on 01/30/2020. She is currently taking OTC vitamin D 1000 each day. She denies nausea, vomiting or muscle weakness. I do not see recent bone density  Scan in chart for the past 3-4 years. Was normal in 10/2014.   Ref. Range 01/30/2020 10:59  Vitamin D, 25-Hydroxy Latest Ref Range: 30.0 - 100.0 ng/mL 40.8   Plan: Increase to 2,000 IU Vit D3 OTC. Vit D level goal is 50-70. Recheck in 3 months. Request referral for bone density scan through PCP office. Low Vitamin D level contributes to fatigue and are associated with obesity, breast, and colon cancer. She agrees to start to take OTC Vitamin D @2000  units daily and will follow-up for routine testing of Vitamin D, at least 2-3 times per year to avoid over-replacement.  5. Nonspecific abnormal electrocardiogram (ECG) We discussed ECG being borderline for meeting LAE criteria. Patient was seen by  cardiology a couple of years ago for some heart palpitations, which she still occasionally has, and was told no follow up needed. Patient is asymptomatic. No cardio-pulm. 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 I recommend if patient develops any new symptoms, which she denied today, she should follow up with cardiology. Reassured patient that ECG is largely unchanged from 04/2019, 7/20,11/17 and prior. Advised tight control of BP and work on weight loss and improving cardio-pulm fitness levels.  6. Class 2 severe obesity with serious comorbidity and body mass index (BMI) of 37.0 to 37.9 in adult, unspecified obesity type Lori Mooney) Lori Mooney is currently in the action stage of change. As such, her goal is to continue with weight loss efforts. She has agreed to the Category 1 Plan.   Exercise goals: As is  Behavioral modification strategies: increasing lean protein intake, decreasing simple carbohydrates, increasing water intake, meal planning and cooking strategies, keeping healthy foods in the home, better snacking choices, avoiding temptations and planning for success.  Lori Mooney has agreed to follow-up with our clinic in 2 weeks on 02/28/2020 at 1220. She was informed of the importance of frequent follow-up visits to maximize  her success with intensive lifestyle modifications for her multiple health conditions.  Objective:   VITALS: Per patient if applicable, see vitals. GENERAL: Alert and in no acute distress. CARDIOPULMONARY: No increased WOB. Speaking in clear sentences.  PSYCH: Pleasant and cooperative. Speech normal rate and rhythm. Affect is appropriate. Insight and judgement are appropriate. Attention is focused, linear, and appropriate.  NEURO: Oriented as arrived to appointment on time with no prompting.   Lab Results  Component Value Date   CREATININE 0.82 01/30/2020   BUN 15 01/30/2020   NA 139 01/30/2020   K 4.6 01/30/2020   CL 100 01/30/2020   CO2 22 01/30/2020   Lab  Results  Component Value Date   ALT 19 01/30/2020   AST 14 01/30/2020   ALKPHOS 85 01/30/2020   BILITOT 0.4 01/30/2020   Lab Results  Component Value Date   HGBA1C 6.0 (H) 01/30/2020   HGBA1C 6.0 (H) 11/20/2019   HGBA1C 5.9 (H) 05/02/2019   HGBA1C 5.9 (H) 08/10/2018   HGBA1C 6.0 (H) 10/19/2017   Lab Results  Component Value Date   INSULIN 20.7 01/30/2020   Lab Results  Component Value Date   TSH 2.590 01/30/2020   Lab Results  Component Value Date   CHOL 235 (H) 01/30/2020   HDL 60 01/30/2020   LDLCALC 122 (H) 01/30/2020   LDLDIRECT 95.2 09/06/2012   TRIG 304 (H) 01/30/2020   CHOLHDL 3.9 01/30/2020   Lab Results  Component Value Date   WBC 7.0 01/30/2020   HGB 16.0 (H) 01/30/2020   HCT 47.5 (H) 01/30/2020   MCV 89 01/30/2020   PLT 336 01/30/2020   No results found for: IRON, TIBC, FERRITIN  Attestation Statements:   Reviewed by clinician on day of visit: allergies, medications, problem list, medical history, surgical history, family history, social history, and previous encounter notes.  Coral Ceo, am acting as Location manager for [employer], DO.  I have reviewed the above documentation for accuracy and completeness, and I agree with the above. Marjory Sneddon, D.O.  The [place] was signed into law in 2016 which includes the topic of electronic health records.  This provides immediate access to information in MyChart.  This includes consultation notes, operative notes, office notes, lab results and pathology reports.  If you have any questions about what you read please let us know at your next visit so we can discuss your concerns and take corrective action if need be.  We are right here with you.

## 2020-02-28 ENCOUNTER — Other Ambulatory Visit: Payer: Self-pay

## 2020-02-28 ENCOUNTER — Encounter (INDEPENDENT_AMBULATORY_CARE_PROVIDER_SITE_OTHER): Payer: Self-pay | Admitting: Family Medicine

## 2020-02-28 ENCOUNTER — Ambulatory Visit (INDEPENDENT_AMBULATORY_CARE_PROVIDER_SITE_OTHER): Payer: Medicare Other | Admitting: Family Medicine

## 2020-02-28 VITALS — BP 122/75 | HR 74 | Temp 97.5°F | Ht 62.0 in | Wt 201.0 lb

## 2020-02-28 DIAGNOSIS — R7303 Prediabetes: Secondary | ICD-10-CM

## 2020-02-28 DIAGNOSIS — Z6836 Body mass index (BMI) 36.0-36.9, adult: Secondary | ICD-10-CM

## 2020-02-28 DIAGNOSIS — E88819 Insulin resistance, unspecified: Secondary | ICD-10-CM | POA: Insufficient documentation

## 2020-02-28 DIAGNOSIS — E8881 Metabolic syndrome: Secondary | ICD-10-CM | POA: Insufficient documentation

## 2020-02-28 DIAGNOSIS — E7849 Other hyperlipidemia: Secondary | ICD-10-CM | POA: Diagnosis not present

## 2020-02-29 ENCOUNTER — Ambulatory Visit: Payer: Medicare Other | Admitting: Family Medicine

## 2020-03-04 NOTE — Progress Notes (Signed)
Chief Complaint:   OBESITY Lori Mooney is here to discuss her progress with her obesity treatment plan along with follow-up of her obesity related diagnoses.   Today's visit was #: 3 Starting weight: 203 lbs Starting date: 01/30/2020 Today's weight: 201 lbs Today's date: 02/28/2020 Total lbs lost to date: 2 lbs Body mass index is 36.76 kg/m.  Total weight loss percentage to date: -0.99%  Interim History: Lori Mooney is here for a follow up office visit.  she is following the meal plan without concern or issues.  Patient's meal and food recall appears to be accurate and consistent with what is on the plan.  When on plan, her hunger and cravings are well controlled.    Nutrition Plan: Category 1 Plan for 80-90% of the time. Activity: AHOY low impact aerobics for 45-50 minutes 2 times per week.  Assessment/Plan:   1. Other hyperlipidemia Course: Not at goal.. Lipid-lowering medications: Lipitor 20 mg daily.   Plan:  Extensive counseling performed again regarding modification of foods to help improve cholesterol levels and increase activity. IN addition, I recommended she discuss these results with her PCP to see if a dose adjustment is warranted/ recommended by them; per patient, she was going to work on diet and exercise the next several months without a dose adjustment  Dietary changes: Increase soluble fiber, decrease simple carbohydrates, decrease saturated fat. Exercise changes: Moderate to vigorous-intensity aerobic activity 150 minutes per week or as tolerated. We will continue to monitor along with PCP/specialists as it pertains to her weight loss journey.  Lab Results  Component Value Date   CHOL 235 (H) 01/30/2020   HDL 60 01/30/2020   LDLCALC 122 (H) 01/30/2020   LDLDIRECT 95.2 09/06/2012   TRIG 304 (H) 01/30/2020   CHOLHDL 3.9 01/30/2020   Lab Results  Component Value Date   ALT 19 01/30/2020   AST 14 01/30/2020   ALKPHOS 85 01/30/2020   BILITOT 0.4  01/30/2020   The 10-year ASCVD risk score Mikey Bussing DC Jr., et al., 2013) is: 17.8%   Values used to calculate the score:     Age: 75 years     Sex: Female     Is Non-Hispanic African American: No     Diabetic: No     Tobacco smoker: No     Systolic Blood Pressure: 536 mmHg     Is BP treated: Yes     HDL Cholesterol: 60 mg/dL     Total Cholesterol: 235 mg/dL   2. Prediabetes With insulin resistance.  Not at goal. Goal is HgbA1c < 5.7.  Medication: None.   Plan:  Discussed with her insulin resistance and lab results again.  All questions were answered.  She will continue to focus on protein-rich, low simple carbohydrate foods. We reviewed the importance of hydration, regular exercise for stress reduction, and restorative sleep.   Lab Results  Component Value Date   HGBA1C 6.0 (H) 01/30/2020   Lab Results  Component Value Date   INSULIN 20.7 01/30/2020   3. Class 2 severe obesity with serious comorbidity and body mass index (BMI) of 36.0 to 36.9 in adult, unspecified obesity type (Lori Mooney)  Course: Lori Mooney is currently in the action stage of change. As such, her goal is to continue with weight loss efforts.   Nutrition goals: She has agreed to the Category 1 Plan.   Exercise goals: For substantial health benefits, adults should do at least 150 minutes (2 hours and 30 minutes) a week  of moderate-intensity, or 75 minutes (1 hour and 15 minutes) a week of vigorous-intensity aerobic physical activity, or an equivalent combination of moderate- and vigorous-intensity aerobic activity. Aerobic activity should be performed in episodes of at least 10 minutes, and preferably, it should be spread throughout the week.  Behavioral modification strategies: meal planning and cooking strategies, keeping healthy foods in the home and planning for success.  Lori Mooney has agreed to follow-up with our clinic in 2 weeks with APP. She was informed of the importance of frequent follow-up visits to maximize her  success with intensive lifestyle modifications for her multiple health conditions.   Objective:   Blood pressure 122/75, pulse 74, temperature (!) 97.5 F (36.4 C), height 5\' 2"  (1.575 m), weight 201 lb (91.2 kg), SpO2 98 %. Body mass index is 36.76 kg/m.  General: Cooperative, alert, well developed, in no acute distress. HEENT: Conjunctivae and lids unremarkable. Cardiovascular: Regular rhythm.  Lungs: Normal work of breathing. Neurologic: No focal deficits.   Lab Results  Component Value Date   CREATININE 0.82 01/30/2020   BUN 15 01/30/2020   NA 139 01/30/2020   K 4.6 01/30/2020   CL 100 01/30/2020   CO2 22 01/30/2020   Lab Results  Component Value Date   ALT 19 01/30/2020   AST 14 01/30/2020   ALKPHOS 85 01/30/2020   BILITOT 0.4 01/30/2020   Lab Results  Component Value Date   HGBA1C 6.0 (H) 01/30/2020   HGBA1C 6.0 (H) 11/20/2019   HGBA1C 5.9 (H) 05/02/2019   HGBA1C 5.9 (H) 08/10/2018   HGBA1C 6.0 (H) 10/19/2017   Lab Results  Component Value Date   INSULIN 20.7 01/30/2020   Lab Results  Component Value Date   TSH 2.590 01/30/2020   Lab Results  Component Value Date   CHOL 235 (H) 01/30/2020   HDL 60 01/30/2020   LDLCALC 122 (H) 01/30/2020   LDLDIRECT 95.2 09/06/2012   TRIG 304 (H) 01/30/2020   CHOLHDL 3.9 01/30/2020   Lab Results  Component Value Date   WBC 7.0 01/30/2020   HGB 16.0 (H) 01/30/2020   HCT 47.5 (H) 01/30/2020   MCV 89 01/30/2020   PLT 336 01/30/2020   Obesity Behavioral Intervention:   Approximately 15 minutes were spent on the discussion below.  ASK: We discussed the diagnosis of obesity with Lori Mooney today and Lori Mooney agreed to give [place] permission to discuss obesity behavioral modification therapy today.  ASSESS: Lori Mooney has the diagnosis of obesity and her BMI today is 36.8. Lori Mooney is in the action stage of change.   ADVISE: Lori Mooney was educated on the multiple health risks of obesity as well as the benefit of weight loss to  improve her health. She was advised of the need for long term treatment and the importance of lifestyle modifications to improve her current health and to decrease her risk of future health problems.  AGREE: Multiple dietary modification options and treatment options were discussed and Lori Mooney agreed to follow the recommendations documented in the above note.  ARRANGE: Lori Mooney was educated on the importance of frequent visits to treat obesity as outlined per CMS and USPSTF guidelines and agreed to schedule her next follow up appointment today.  Attestation Statements:   Reviewed by clinician on day of visit: allergies, medications, problem list, medical history, surgical history, family history, social history, and previous encounter notes.  I, Water quality scientist, CMA, am acting as Location manager for [employer], DO.  I have reviewed the above documentation for accuracy and completeness, and  I agree with the above. Marjory Sneddon, D.O.  The [place] was signed into law in 2016 which includes the topic of electronic health records.  This provides immediate access to information in MyChart.  This includes consultation notes, operative notes, office notes, lab results and pathology reports.  If you have any questions about what you read please let us know at your next visit so we can discuss your concerns and take corrective action if need be.  We are right here with you.

## 2020-03-14 ENCOUNTER — Encounter (INDEPENDENT_AMBULATORY_CARE_PROVIDER_SITE_OTHER): Payer: Self-pay

## 2020-03-14 ENCOUNTER — Ambulatory Visit (INDEPENDENT_AMBULATORY_CARE_PROVIDER_SITE_OTHER): Payer: Medicare Other | Admitting: Adult Health

## 2020-03-14 DIAGNOSIS — L814 Other melanin hyperpigmentation: Secondary | ICD-10-CM | POA: Diagnosis not present

## 2020-03-14 DIAGNOSIS — D485 Neoplasm of uncertain behavior of skin: Secondary | ICD-10-CM | POA: Diagnosis not present

## 2020-03-14 DIAGNOSIS — C44612 Basal cell carcinoma of skin of right upper limb, including shoulder: Secondary | ICD-10-CM | POA: Diagnosis not present

## 2020-03-14 DIAGNOSIS — D2239 Melanocytic nevi of other parts of face: Secondary | ICD-10-CM | POA: Diagnosis not present

## 2020-03-14 DIAGNOSIS — Z85828 Personal history of other malignant neoplasm of skin: Secondary | ICD-10-CM | POA: Diagnosis not present

## 2020-03-14 DIAGNOSIS — L918 Other hypertrophic disorders of the skin: Secondary | ICD-10-CM | POA: Diagnosis not present

## 2020-03-14 DIAGNOSIS — D1801 Hemangioma of skin and subcutaneous tissue: Secondary | ICD-10-CM | POA: Diagnosis not present

## 2020-03-14 DIAGNOSIS — L570 Actinic keratosis: Secondary | ICD-10-CM | POA: Diagnosis not present

## 2020-03-14 DIAGNOSIS — L821 Other seborrheic keratosis: Secondary | ICD-10-CM | POA: Diagnosis not present

## 2020-03-18 ENCOUNTER — Other Ambulatory Visit: Payer: Self-pay

## 2020-03-18 ENCOUNTER — Ambulatory Visit (INDEPENDENT_AMBULATORY_CARE_PROVIDER_SITE_OTHER): Payer: Medicare Other | Admitting: Adult Health

## 2020-03-18 ENCOUNTER — Encounter (INDEPENDENT_AMBULATORY_CARE_PROVIDER_SITE_OTHER): Payer: Self-pay | Admitting: Adult Health

## 2020-03-18 VITALS — BP 109/73 | HR 94 | Temp 98.0°F | Ht 62.0 in | Wt 196.0 lb

## 2020-03-18 DIAGNOSIS — R7303 Prediabetes: Secondary | ICD-10-CM

## 2020-03-18 DIAGNOSIS — I10 Essential (primary) hypertension: Secondary | ICD-10-CM | POA: Diagnosis not present

## 2020-03-18 DIAGNOSIS — Z6836 Body mass index (BMI) 36.0-36.9, adult: Secondary | ICD-10-CM | POA: Diagnosis not present

## 2020-03-19 NOTE — Progress Notes (Signed)
Chief Complaint:   OBESITY Lori Mooney is here to discuss her progress with her obesity treatment plan along with follow-up of her obesity related diagnoses. Lori Mooney is on the Category 1 Plan and states she is following her eating plan approximately 90% of the time. Lori Mooney states she is doing 0 minutes 0 times per week.  Today's visit was #: 4 Starting weight: 203 lbs Starting date: 01/30/2020 Today's weight: 196 lbs Today's date: 03/18/2020 Total lbs lost to date: 7 lbs Total lbs lost since last in-office visit: 5 lbs  Interim History: Lori Mooney continues to enjoy the structure and foods of Category 1 meal plan. She denies polyphagia when she eats on plan. She is down 5 lbs since her last OV. Her current BMI is 36.0.  Subjective:   1. Pre-diabetes 01/30/2020 A1c 6.0 with a normal blood glucose of 86 and an elevated insulin level of 20.7. Lori Mooney is not on Metformin. She has been controlling pre-diabetes with diet.  Lab Results  Component Value Date   HGBA1C 6.0 (H) 01/30/2020   Lab Results  Component Value Date   INSULIN 20.7 01/30/2020    2. Essential hypertension BP and heart rate are at goal at OV. Legaci is on lisinopril/HCTZ 20-12.5 mg daily.  BP Readings from Last 3 Encounters:  03/18/20 109/73  02/28/20 122/75  02/13/20 120/78    Assessment/Plan:   1. Pre-diabetes Jaala will continue to work on weight loss, exercise, and decreasing simple carbohydrates to help decrease the risk of diabetes. Continue category 1 meal plan. Check labs in 3 months.  2. Essential hypertension Astra is working on healthy weight loss and exercise to improve blood pressure control. We will watch for signs of hypotension as she continues her lifestyle modifications. Continue category 1 meal plan and current anti-hypertensive.  3. Class 2 severe obesity with serious comorbidity and body mass index (BMI) of 36.0 to 36.9 in adult, unspecified obesity type [facility]) Lori Mooney is currently in the action  stage of change. As such, her goal is to continue with weight loss efforts. She has agreed to the Category 1 Plan.   Exercise goals: For substantial health benefits, adults should do at least 150 minutes (2 hours and 30 minutes) a week of moderate-intensity, or 75 minutes (1 hour and 15 minutes) a week of vigorous-intensity aerobic physical activity, or an equivalent combination of moderate- and vigorous-intensity aerobic activity. Aerobic activity should be performed in episodes of at least 10 minutes, and preferably, it should be spread throughout the week.  Behavioral modification strategies: increasing lean protein intake, increasing water intake, meal planning and cooking strategies and planning for success.  Lori Mooney has agreed to follow-up with our clinic in 2 weeks. She was informed of the importance of frequent follow-up visits to maximize her success with intensive lifestyle modifications for her multiple health conditions.   Objective:   Blood pressure 109/73, pulse 94, temperature 98 F (36.7 C), height 5\' 2"  (1.575 m), weight 196 lb (88.9 kg), SpO2 97 %. Body mass index is 35.85 kg/m.  General: Cooperative, alert, well developed, in no acute distress. HEENT: Conjunctivae and lids unremarkable. Cardiovascular: Regular rhythm.  Lungs: Normal work of breathing. Neurologic: No focal deficits.   Lab Results  Component Value Date   CREATININE 0.82 01/30/2020   BUN 15 01/30/2020   NA 139 01/30/2020   K 4.6 01/30/2020   CL 100 01/30/2020   CO2 22 01/30/2020   Lab Results  Component Value Date   ALT 19 01/30/2020  AST 14 01/30/2020   ALKPHOS 85 01/30/2020   BILITOT 0.4 01/30/2020   Lab Results  Component Value Date   HGBA1C 6.0 (H) 01/30/2020   HGBA1C 6.0 (H) 11/20/2019   HGBA1C 5.9 (H) 05/02/2019   HGBA1C 5.9 (H) 08/10/2018   HGBA1C 6.0 (H) 10/19/2017   Lab Results  Component Value Date   INSULIN 20.7 01/30/2020   Lab Results  Component Value Date   TSH 2.590  01/30/2020   Lab Results  Component Value Date   CHOL 235 (H) 01/30/2020   HDL 60 01/30/2020   LDLCALC 122 (H) 01/30/2020   LDLDIRECT 95.2 09/06/2012   TRIG 304 (H) 01/30/2020   CHOLHDL 3.9 01/30/2020   Lab Results  Component Value Date   WBC 7.0 01/30/2020   HGB 16.0 (H) 01/30/2020   HCT 47.5 (H) 01/30/2020   MCV 89 01/30/2020   PLT 336 01/30/2020    Attestation Statements:   Reviewed by clinician on day of visit: allergies, medications, problem list, medical history, surgical history, family history, social history, and previous encounter notes.  Time spent on visit including pre-visit chart review and post-visit care and charting was 38 minutes.   Coral Ceo, am acting as Location manager for Mina Marble, NP.  I have reviewed the above documentation for accuracy and completeness, and I agree with the above. -  Lunna Vogelgesang d. Amontae Ng, NP-C

## 2020-04-01 ENCOUNTER — Ambulatory Visit (INDEPENDENT_AMBULATORY_CARE_PROVIDER_SITE_OTHER): Payer: Medicare Other | Admitting: Adult Health

## 2020-04-01 ENCOUNTER — Other Ambulatory Visit: Payer: Self-pay

## 2020-04-01 ENCOUNTER — Encounter (INDEPENDENT_AMBULATORY_CARE_PROVIDER_SITE_OTHER): Payer: Self-pay | Admitting: Adult Health

## 2020-04-01 VITALS — BP 121/66 | HR 75 | Temp 97.9°F | Ht 62.0 in | Wt 197.0 lb

## 2020-04-01 DIAGNOSIS — I10 Essential (primary) hypertension: Secondary | ICD-10-CM | POA: Diagnosis not present

## 2020-04-01 DIAGNOSIS — Z6836 Body mass index (BMI) 36.0-36.9, adult: Secondary | ICD-10-CM | POA: Diagnosis not present

## 2020-04-01 DIAGNOSIS — R7303 Prediabetes: Secondary | ICD-10-CM | POA: Diagnosis not present

## 2020-04-02 NOTE — Progress Notes (Signed)
Chief Complaint:   OBESITY Lori Mooney is here to discuss her progress with her obesity treatment plan along with follow-up of her obesity related diagnoses. Lori Mooney is on the Category 1 Plan and states she is following her eating plan approximately 80-90% of the time. Lori Mooney states she is cardio and stretching 45-60 minutes 2 times per week.  Today's visit was #: 5 Starting weight: 203 lbs Starting date: 01/30/2020 Today's weight: 197 lbs Today's date: 04/01/2020 Total lbs lost to date: 6 lbs Total lbs lost since last in-office visit: 0  Interim History: Lori Mooney has put My Fitness Pal on her phone but is unable to use easily. She has tracking intake via paper/pencil.  She is unsure how to turn on/use home food scale- reviewed how to use with pt today.  Subjective:   1. Pre-diabetes Lori Mooney's A1c has been 6.0 at the last 2 checks. She is not and has never been on any anti-diabetic prescriptions.   Lab Results  Component Value Date   HGBA1C 6.0 (H) 01/30/2020   Lab Results  Component Value Date   INSULIN 20.7 01/30/2020    2. Essential hypertension BP and heart rate are stable at OV. She is on lisinopril/HCTZ 20-12.25 mg daily.  BP Readings from Last 3 Encounters:  04/01/20 121/66  03/18/20 109/73  02/28/20 122/75    Assessment/Plan:   1. Pre-diabetes Lori Mooney will continue to work on weight loss, exercise, and decreasing simple carbohydrates to help decrease the risk of diabetes. Increase protein and exercise.  2. Essential hypertension Lori Mooney is working on healthy weight loss and exercise to improve blood pressure control. We will watch for signs of hypotension as she continues her lifestyle modifications. Continue current anti-hypertensive regimen and category 1 meal plan.  3. Class 2 severe obesity with serious comorbidity and body mass index (BMI) of 36.0 to 36.9 in adult, unspecified obesity type [facility]) Lori Mooney is currently in the action stage of change. As such, her goal  is to continue with weight loss efforts. She has agreed to the Category 1 Plan.   Reviewed how to use food scale. Reviewed how to use My Fitness Pal.  Exercise goals: As is  Behavioral modification strategies: increasing lean protein intake, decreasing simple carbohydrates, no skipping meals, meal planning and cooking strategies and planning for success.  Lori Mooney has agreed to follow-up with our clinic in 2 weeks. She was informed of the importance of frequent follow-up visits to maximize her success with intensive lifestyle modifications for her multiple health conditions.   Objective:   Blood pressure 121/66, pulse 75, temperature 97.9 F (36.6 C), height 5\' 2"  (1.575 m), weight 197 lb (89.4 kg), SpO2 96 %. Body mass index is 36.03 kg/m.  General: Cooperative, alert, well developed, in no acute distress. HEENT: Conjunctivae and lids unremarkable. Cardiovascular: Regular rhythm.  Lungs: Normal work of breathing. Neurologic: No focal deficits.   Lab Results  Component Value Date   CREATININE 0.82 01/30/2020   BUN 15 01/30/2020   NA 139 01/30/2020   K 4.6 01/30/2020   CL 100 01/30/2020   CO2 22 01/30/2020   Lab Results  Component Value Date   ALT 19 01/30/2020   AST 14 01/30/2020   ALKPHOS 85 01/30/2020   BILITOT 0.4 01/30/2020   Lab Results  Component Value Date   HGBA1C 6.0 (H) 01/30/2020   HGBA1C 6.0 (H) 11/20/2019   HGBA1C 5.9 (H) 05/02/2019   HGBA1C 5.9 (H) 08/10/2018   HGBA1C 6.0 (H) 10/19/2017  Lab Results  Component Value Date   INSULIN 20.7 01/30/2020   Lab Results  Component Value Date   TSH 2.590 01/30/2020   Lab Results  Component Value Date   CHOL 235 (H) 01/30/2020   HDL 60 01/30/2020   LDLCALC 122 (H) 01/30/2020   LDLDIRECT 95.2 09/06/2012   TRIG 304 (H) 01/30/2020   CHOLHDL 3.9 01/30/2020   Lab Results  Component Value Date   WBC 7.0 01/30/2020   HGB 16.0 (H) 01/30/2020   HCT 47.5 (H) 01/30/2020   MCV 89 01/30/2020   PLT 336  01/30/2020    Attestation Statements:   Reviewed by clinician on day of visit: allergies, medications, problem list, medical history, surgical history, family history, social history, and previous encounter notes.  Time spent on visit including pre-visit chart review and post-visit care and charting was 38 minutes.   Coral Ceo, am acting as Location manager for Mina Marble, NP.  I have reviewed the above documentation for accuracy and completeness, and I agree with the above. -  Duron Meister d. Latise Dilley, NP-C

## 2020-04-04 DIAGNOSIS — L988 Other specified disorders of the skin and subcutaneous tissue: Secondary | ICD-10-CM | POA: Diagnosis not present

## 2020-04-04 DIAGNOSIS — C44612 Basal cell carcinoma of skin of right upper limb, including shoulder: Secondary | ICD-10-CM | POA: Diagnosis not present

## 2020-04-08 DIAGNOSIS — H25011 Cortical age-related cataract, right eye: Secondary | ICD-10-CM | POA: Diagnosis not present

## 2020-04-08 DIAGNOSIS — H2511 Age-related nuclear cataract, right eye: Secondary | ICD-10-CM | POA: Diagnosis not present

## 2020-04-08 DIAGNOSIS — Z961 Presence of intraocular lens: Secondary | ICD-10-CM | POA: Diagnosis not present

## 2020-04-08 DIAGNOSIS — H35372 Puckering of macula, left eye: Secondary | ICD-10-CM | POA: Diagnosis not present

## 2020-04-15 ENCOUNTER — Encounter (INDEPENDENT_AMBULATORY_CARE_PROVIDER_SITE_OTHER): Payer: Self-pay

## 2020-04-15 ENCOUNTER — Encounter (INDEPENDENT_AMBULATORY_CARE_PROVIDER_SITE_OTHER): Payer: Self-pay | Admitting: Adult Health

## 2020-04-15 ENCOUNTER — Ambulatory Visit (INDEPENDENT_AMBULATORY_CARE_PROVIDER_SITE_OTHER): Payer: Medicare Other | Admitting: Adult Health

## 2020-04-15 ENCOUNTER — Other Ambulatory Visit: Payer: Self-pay

## 2020-04-15 ENCOUNTER — Ambulatory Visit (INDEPENDENT_AMBULATORY_CARE_PROVIDER_SITE_OTHER): Payer: Medicare Other | Admitting: Family Medicine

## 2020-04-15 VITALS — BP 111/68 | HR 90 | Temp 97.7°F | Ht 62.0 in | Wt 194.0 lb

## 2020-04-15 DIAGNOSIS — R7303 Prediabetes: Secondary | ICD-10-CM | POA: Diagnosis not present

## 2020-04-15 DIAGNOSIS — E7849 Other hyperlipidemia: Secondary | ICD-10-CM

## 2020-04-15 DIAGNOSIS — I10 Essential (primary) hypertension: Secondary | ICD-10-CM | POA: Diagnosis not present

## 2020-04-16 LAB — CMP14+EGFR
ALT: 15 IU/L (ref 0–32)
AST: 14 IU/L (ref 0–40)
Albumin/Globulin Ratio: 2.1 (ref 1.2–2.2)
Albumin: 4.7 g/dL (ref 3.7–4.7)
Alkaline Phosphatase: 83 IU/L (ref 44–121)
BUN/Creatinine Ratio: 19 (ref 12–28)
BUN: 18 mg/dL (ref 8–27)
Bilirubin Total: 0.4 mg/dL (ref 0.0–1.2)
CO2: 21 mmol/L (ref 20–29)
Calcium: 10.1 mg/dL (ref 8.7–10.3)
Chloride: 101 mmol/L (ref 96–106)
Creatinine, Ser: 0.95 mg/dL (ref 0.57–1.00)
Globulin, Total: 2.2 g/dL (ref 1.5–4.5)
Glucose: 91 mg/dL (ref 65–99)
Potassium: 4.4 mmol/L (ref 3.5–5.2)
Sodium: 139 mmol/L (ref 134–144)
Total Protein: 6.9 g/dL (ref 6.0–8.5)
eGFR: 63 mL/min/{1.73_m2} (ref >59)

## 2020-04-16 LAB — HEMOGLOBIN A1C
Est. average glucose Bld gHb Est-mCnc: 128 mg/dL
Hgb A1c MFr Bld: 6.1 % — ABNORMAL HIGH (ref 4.8–5.6)

## 2020-04-16 LAB — CBC WITH DIFFERENTIAL/PLATELET
Basophils Absolute: 0.1 10*3/uL (ref 0.0–0.2)
Basos: 1 %
EOS (ABSOLUTE): 0.2 10*3/uL (ref 0.0–0.4)
Eos: 3 %
Hematocrit: 47.9 % — ABNORMAL HIGH (ref 34.0–46.6)
Hemoglobin: 15.8 g/dL (ref 11.1–15.9)
Immature Grans (Abs): 0 10*3/uL (ref 0.0–0.1)
Immature Granulocytes: 0 %
Lymphocytes Absolute: 2.3 10*3/uL (ref 0.7–3.1)
Lymphs: 35 %
MCH: 30.2 pg (ref 26.6–33.0)
MCHC: 33 g/dL (ref 31.5–35.7)
MCV: 91 fL (ref 79–97)
Monocytes Absolute: 0.5 10*3/uL (ref 0.1–0.9)
Monocytes: 7 %
Neutrophils Absolute: 3.6 10*3/uL (ref 1.4–7.0)
Neutrophils: 54 %
Platelets: 308 10*3/uL (ref 150–450)
RBC: 5.24 x10E6/uL (ref 3.77–5.28)
RDW: 12.8 % (ref 11.7–15.4)
WBC: 6.7 10*3/uL (ref 3.4–10.8)

## 2020-04-16 LAB — LIPID PANEL
Chol/HDL Ratio: 3.2 ratio (ref 0.0–4.4)
Cholesterol, Total: 178 mg/dL (ref 100–199)
HDL: 55 mg/dL (ref >39)
LDL Chol Calc (NIH): 88 mg/dL (ref 0–99)
Triglycerides: 209 mg/dL — ABNORMAL HIGH (ref 0–149)
VLDL Cholesterol Cal: 35 mg/dL (ref 5–40)

## 2020-04-19 ENCOUNTER — Other Ambulatory Visit: Payer: Self-pay

## 2020-04-19 ENCOUNTER — Ambulatory Visit (INDEPENDENT_AMBULATORY_CARE_PROVIDER_SITE_OTHER): Payer: Medicare Other | Admitting: Family Medicine

## 2020-04-19 DIAGNOSIS — M5432 Sciatica, left side: Secondary | ICD-10-CM

## 2020-04-19 DIAGNOSIS — I10 Essential (primary) hypertension: Secondary | ICD-10-CM

## 2020-04-19 DIAGNOSIS — E7800 Pure hypercholesterolemia, unspecified: Secondary | ICD-10-CM

## 2020-04-19 DIAGNOSIS — M5136 Other intervertebral disc degeneration, lumbar region: Secondary | ICD-10-CM | POA: Diagnosis not present

## 2020-04-19 MED ORDER — MELOXICAM 7.5 MG PO TABS: 7.5000 mg | ORAL_TABLET | Freq: Every day | ORAL | 1 refills | Status: DC | PRN

## 2020-04-19 MED ORDER — LISINOPRIL-HYDROCHLOROTHIAZIDE 20-12.5 MG PO TABS: 1.0000 | ORAL_TABLET | Freq: Every day | ORAL | 1 refills | Status: DC

## 2020-04-19 MED ORDER — ATORVASTATIN CALCIUM 20 MG PO TABS: 20.0000 mg | ORAL_TABLET | Freq: Every day | ORAL | 1 refills | Status: DC

## 2020-04-19 NOTE — Patient Instructions (Addendum)
If blood pressures decrease further as your weight improves, I would recommend changing to lisinopril/hct 10/12.5mg . let me know.   No other med changes.   Call Dr. Osborn Coho office to determine when your next colonoscopy is due.   If you have lab work done today you will be contacted with your lab results within the next 2 weeks.  If you have not heard from [place] then please contact us. The fastest way to get your results is to register for My Chart.   IF you received an x-ray today, you will receive an invoice from [facility]. Please contact [facility] at [phone] with questions or concerns regarding your invoice.   IF you received labwork today, you will receive an invoice from [place]. Please contact [facility] at [phone] with questions or concerns regarding your invoice.   Our billing staff will not be able to assist you with questions regarding bills from these companies.  You will be contacted with the lab results as soon as they are available. The fastest way to get your results is to activate your My Chart account. Instructions are located on the last page of this paperwork. If you have not heard from [place] regarding the results in 2 weeks, please contact this office.

## 2020-04-19 NOTE — Progress Notes (Signed)
Subjective:  Patient ID: Lori Mooney, female    DOB: April 24, 1945  Age: 75 y.o. MRN: [id number]  CC:  Chief Complaint  Patient presents with  . Follow-up    On hypertension, hypercholesteremia, and blood work. Pt reports no issues with BP at home usually within normal range. Pt reports no issues with medication and no side effects.    HPI Lori Mooney presents for   Hypertension: lisinopril hct. No new side effects.  Followed by medical weight mgt.  Weight down from 208 in October.  Pleased with this program. Watching levels, prediabetes there.   Wt Readings from Last 3 Encounters:  04/15/20 194 lb (88 kg)  04/01/20 197 lb (89.4 kg)  03/18/20 196 lb (88.9 kg)   Home readings:112/70.  BP Readings from Last 3 Encounters:  04/15/20 111/68  04/01/20 121/66  03/18/20 109/73   Lab Results  Component Value Date   CREATININE 0.95 04/15/2020    Hyperlipidemia: lipitor 20mg  qd. No new side effects or myalgias. Improved with weight loss.  Lab Results  Component Value Date   CHOL 178 04/15/2020   HDL 55 04/15/2020   LDLCALC 88 04/15/2020   LDLDIRECT 95.2 09/06/2012   TRIG 209 (H) 04/15/2020   CHOLHDL 3.2 04/15/2020   Lab Results  Component Value Date   ALT 15 04/15/2020   AST 14 04/15/2020   ALKPHOS 83 04/15/2020   BILITOT 0.4 04/15/2020   Spinal stenosis Episodic sciatica/back pain Better with weight loss.  mobic few times per month.    Prediabetes: Followed at healthy weight and wellness.  Lab Results  Component Value Date   HGBA1C 6.1 (H) 04/15/2020   Wt Readings from Last 3 Encounters:  04/15/20 194 lb (88 kg)  04/01/20 197 lb (89.4 kg)  03/18/20 196 lb (88.9 kg)   HM:  History Patient Active Problem List   Diagnosis Date Noted  . Insulin resistance 02/28/2020  . Other hyperlipidemia 02/14/2020  . Nonspecific abnormal electrocardiogram (ECG) 02/14/2020  . Essential hypertension 01/30/2020  . Hypertriglyceridemia 01/30/2020  .  Other fatigue 01/30/2020  . Prediabetes 01/30/2020  . Osteoarthritis 01/30/2020  . Vitamin D deficiency 01/30/2020  . SOBOE (shortness of breath on exertion) 01/30/2020  . H/O total knee replacement, left 08/24/2018  . Osteoarthritis of spine with radiculopathy, lumbar region 06/25/2017  . Lumbar pain 06/25/2017  . Spinal stenosis of lumbar region without neurogenic claudication 06/25/2017  . Sciatica of right side 06/19/2017  . GERD (gastroesophageal reflux disease) 09/19/2013  . Vertigo 03/02/2012  . Hypercholesterolemia 10/08/2010  . Benign hypertensive heart disease without heart failure 10/08/2010  . Class 2 severe obesity with serious comorbidity and body mass index (BMI) of 36.0 to 36.9 in adult [facility]) 10/08/2010   Past Medical History:  Diagnosis Date  . Allergy   . 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 Cataract   . Constipation   . DDD (degenerative disc disease), lumbar   . Exogenous obesity   . GERD (gastroesophageal reflux disease)   . Heartburn   . Hypercholesterolemia   . Hyperlipidemia    hypercholesterolemia  . Hypertension   . Hypertriglyceridemia   . Osteoarthritis   . Palpitations   . Prediabetes   . SOB (shortness of breath) on exertion   . Swelling of both lower extremities    Past Surgical History:  Procedure Laterality Date  . CATARACT EXTRACTION W/ INTRAOCULAR LENS IMPLANT Left   . DILITATION & CURRETTAGE/HYSTROSCOPY WITH VERSAPOINT RESECTION N/A  08/25/2012   Procedure: DILATATION & CURETTAGE/HYSTEROSCOPY WITH VERSAPOINT RESECTION;  Surgeon: Princess Bruins, MD;  Location: [facility];  Service: Gynecology;  Laterality: N/A;  . EYE SURGERY    . LUMBAR EPIDURAL INJECTION  08/04/2017  . TONSILLECTOMY    . 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   Procedure: TOTAL KNEE ARTHROPLASTY;  Surgeon: [name], MD;  Location: [facility];  Service: Orthopedics;  Laterality: Left;  131min  . TUBAL LIGATION     Allergies  Allergen Reactions  .  Shellfish Allergy Hives  . Strawberry Extract Hives   Prior to Admission medications   Medication Sig Start Date End Date Taking? Authorizing Provider  acetaminophen (TYLENOL) 325 MG tablet Take 650 mg by mouth every 6 (six) hours as needed.   Yes [provider]  atorvastatin (LIPITOR) 20 MG tablet Take 1 tablet (20 mg total) by mouth daily. 11/24/19  Yes [name], MD  Biotin w/ Vitamins C & E (HAIR/SKIN/NAILS PO) Take by mouth. Natures Bounty   Yes [provider]  Cholecalciferol (VITAMIN D-3) 1000 UNITS CAPS Take 1,000 Units by mouth daily.   Yes [provider]  famotidine (PEPCID) 10 MG tablet Take 10 mg by mouth at bedtime.    Yes [provider]  lisinopril-hydrochlorothiazide (ZESTORETIC) 20-12.5 MG tablet Take 1 tablet by mouth daily. 11/24/19  Yes [name], MD  loratadine (CLARITIN) 10 MG tablet Take 10 mg by mouth daily as needed for allergies.    Yes [provider]  magnesium 30 MG tablet Take 250 mg by mouth daily.    Yes [provider]  meloxicam (MOBIC) 7.5 MG tablet Take 1 tablet (7.5 mg total) by mouth daily as needed for pain. 01/11/20  Yes [name], MD   Social History   Socioeconomic History  . Marital status: Married    Spouse name: Lori Mooney  . Number of children: 2  . Years of education: Not on file  . Highest education level: Not on file  Occupational History  . Occupation: Retired Therapist, sports  Tobacco Use  . Smoking status: Never Smoker  . Smokeless tobacco: Never Used  Vaping Use  . Vaping Use: Never used  Substance and Sexual Activity  . Alcohol use: Yes    Alcohol/week: 1.0 - 2.0 standard drink    Types: 1 - 2 Standard drinks or equivalent per week  . Drug use: No  . Sexual activity: Yes  Other Topics Concern  . Not on file  Social History Narrative   Married. Education: [employer].   Social Determinants of Health   Financial Resource Strain: Not on file  Food Insecurity:  Not on file  Transportation Needs: Not on file  Physical Activity: Not on file  Stress: Not on file  Social Connections: Not on file  Intimate Partner Violence: Not on file    Review of Systems  Constitutional: Negative for fatigue and unexpected weight change.  Respiratory: Negative for chest tightness and shortness of breath.   Cardiovascular: Negative for chest pain, palpitations and leg swelling.  Gastrointestinal: Negative for abdominal pain and blood in stool.  Neurological: Negative for dizziness, syncope, light-headedness and headaches.     Objective:  There were no vitals filed for this visit. Vitals were obtained in office, but not entered.  Unable to review at this time as triage paper unavailable.  Physical Exam Vitals reviewed.  Constitutional:      Appearance: She is well-developed.  HENT:     Head: Normocephalic  and atraumatic.  Eyes:     Conjunctiva/sclera: Conjunctivae normal.     Pupils: Pupils are equal, round, and reactive to light.  Neck:     Vascular: No carotid bruit.  Cardiovascular:     Rate and Rhythm: Normal rate and regular rhythm.     Heart sounds: Normal heart sounds.  Pulmonary:     Effort: Pulmonary effort is normal.     Breath sounds: Normal breath sounds.  Abdominal:     Palpations: Abdomen is soft. There is no pulsatile mass.     Tenderness: There is no abdominal tenderness.  Skin:    General: Skin is warm and dry.  Neurological:     Mental Status: She is alert and oriented to person, place, and time.  Psychiatric:        Behavior: Behavior normal.        Assessment & Plan:  Lori Mooney is a 75 y.o. female . Hypercholesterolemia - Plan: atorvastatin (LIPITOR) 20 MG tablet  Essential hypertension - Plan: lisinopril-hydrochlorothiazide (ZESTORETIC) 20-12.5 MG tablet  Sciatica, left side - Plan: meloxicam (MOBIC) 7.5 MG tablet  DDD (degenerative disc disease), lumbar - Plan: meloxicam (MOBIC) 7.5 MG  tablet  Commended on weight loss.  Would consider lower dose of antihypertensives as weight decreases, monitor for hypotension.  Additionally may be able to decrease statin medication as weight improves.  Continue to monitor labs, follow-up with healthy weight and wellness.  No med changes at this time.  Rare use of meloxicam for spinal stenosis, low back pain.  Continue same.  RTC precautions. No orders of the defined types were placed in this encounter.  Patient Instructions   If blood pressures decrease further as your weight improves, I would recommend changing to lisinopril/hct 10/12.5mg . let me know.   No other med changes.   Call Dr. Osborn Coho office to determine when your next colonoscopy is due.   If you have lab work done today you will be contacted with your lab results within the next 2 weeks.  If you have not heard from [place] then please contact us. The fastest way to get your results is to register for My Chart.   IF you received an x-ray today, you will receive an invoice from [facility]. Please contact [facility] at [phone] with questions or concerns regarding your invoice.   IF you received labwork today, you will receive an invoice from [place]. Please contact [facility] at [phone] with questions or concerns regarding your invoice.   Our billing staff will not be able to assist you with questions regarding bills from these companies.  You will be contacted with the lab results as soon as they are available. The fastest way to get your results is to activate your My Chart account. Instructions are located on the last page of this paperwork. If you have not heard from [place] regarding the results in 2 weeks, please contact this office.         Signed, Merri Ray, MD [facility]

## 2020-04-20 ENCOUNTER — Encounter: Payer: Self-pay | Admitting: Family Medicine

## 2020-04-23 ENCOUNTER — Ambulatory Visit: Payer: Medicare Other

## 2020-04-29 ENCOUNTER — Ambulatory Visit: Payer: Medicare Other | Admitting: Family Medicine

## 2020-04-30 ENCOUNTER — Encounter (INDEPENDENT_AMBULATORY_CARE_PROVIDER_SITE_OTHER): Payer: Self-pay | Admitting: Adult Health

## 2020-04-30 ENCOUNTER — Ambulatory Visit (INDEPENDENT_AMBULATORY_CARE_PROVIDER_SITE_OTHER): Payer: Medicare Other | Admitting: Adult Health

## 2020-04-30 ENCOUNTER — Other Ambulatory Visit: Payer: Self-pay

## 2020-04-30 VITALS — BP 108/63 | HR 97 | Temp 98.0°F | Ht 62.0 in | Wt 193.0 lb

## 2020-04-30 DIAGNOSIS — Z6837 Body mass index (BMI) 37.0-37.9, adult: Secondary | ICD-10-CM | POA: Diagnosis not present

## 2020-04-30 DIAGNOSIS — E7800 Pure hypercholesterolemia, unspecified: Secondary | ICD-10-CM | POA: Diagnosis not present

## 2020-04-30 DIAGNOSIS — I10 Essential (primary) hypertension: Secondary | ICD-10-CM | POA: Diagnosis not present

## 2020-04-30 DIAGNOSIS — R7303 Prediabetes: Secondary | ICD-10-CM | POA: Diagnosis not present

## 2020-05-01 NOTE — Progress Notes (Signed)
Chief Complaint:   OBESITY Lori Mooney is here to discuss her progress with her obesity treatment plan along with follow-up of her obesity related diagnoses. [name] is on the Category 1 Plan and states she is following her eating plan approximately 70% of the time. [name] states she is doing low impact classes 60 minutes 2 times per week.  Today's visit was #: 6 Starting weight: 203 lbs Starting date: 01/30/2020 Today's weight: 193 lbs Today's date: 04/30/2020 Total lbs lost to date: 10 Total lbs lost since last in-office visit: 1  Interim History: [name] will travel to [place] for a family trip for 3 days, then continue into [place].  She and her husband will leave [place] next week. She is interested in travel eating strategies.  Subjective:   1. Hypercholesterolemia Discussed labs with patient today. 04/15/2020 lipid panel showed overall great improvement with all levels. Talon's triglycerides remain above goal, however, improved to 209 from last check of 304 on 01/30/20.  Lab Results  Component Value Date   ALT 15 04/15/2020   AST 14 04/15/2020   ALKPHOS 83 04/15/2020   BILITOT 0.4 04/15/2020   Lab Results  Component Value Date   CHOL 178 04/15/2020   HDL 55 04/15/2020   LDLCALC 88 04/15/2020   LDLDIRECT 95.2 09/06/2012   TRIG 209 (H) 04/15/2020   CHOLHDL 3.2 04/15/2020    2. Pre-diabetes Discussed labs with patient today. 04/15/2020 labs revealed a slight increase in A1c of 6.1 with normal BG. Last A1c on 01/30/20- 6.0  Lab Results  Component Value Date   HGBA1C 6.1 (H) 04/15/2020   Lab Results  Component Value Date   INSULIN 20.7 01/30/2020    3. Essential hypertension Discussed labs with patient today. [name]'s BP/HR are stable at OV, however a little soft. Her ambulatory readings run SBP 110-120 and DBP 60-70. She denies symptoms of hypotension. 04/15/2020 CMP is stable.  BP Readings from Last 3 Encounters:  04/30/20 108/63  04/15/20 111/68  04/01/20  121/66    Assessment/Plan:   1. Hypercholesterolemia Cardiovascular risk and specific lipid/LDL goals reviewed.  We discussed several lifestyle modifications today and [name] will continue to work on diet, exercise and weight loss efforts. Orders and follow up as documented in patient record. Continue atorvastatin 20 mg QD.  Counseling Intensive lifestyle modifications are the first line treatment for this issue. . Dietary changes: Increase soluble fiber. Decrease simple carbohydrates. . Exercise changes: Moderate to vigorous-intensity aerobic activity 150 minutes per week if tolerated. . Lipid-lowering medications: see documented in medical record.  2. Pre-diabetes [name] will continue to work on weight loss, exercise, and decreasing simple carbohydrates to help decrease the risk of diabetes. Continue category 1 meal plan.  3. Essential hypertension [name] is working on healthy weight loss and exercise to improve blood pressure control. We will watch for signs of hypotension as she continues her lifestyle modifications. Closely monitor ambulatory BP. Contact office with any symptoms of hypotension. Pt verbalized understanding.  4. Current BMI 35.3 [name] is currently in the action stage of change. As such, her goal is to continue with weight loss efforts. She has agreed to the Category 1 Plan.   Exercise goals: As is  Behavioral modification strategies: increasing lean protein intake, meal planning and cooking strategies and planning for success.  [name] has agreed to follow-up with our clinic in 4 weeks. She was informed of the importance of frequent follow-up visits to maximize her success with intensive lifestyle modifications for her  multiple health conditions.   Objective:   Blood pressure 108/63, pulse 97, temperature 98 F (36.7 C), height 5\' 2"  (1.575 m), weight 193 lb (87.5 kg), SpO2 97 %. Body mass index is 35.3 kg/m.  General: Cooperative, alert, well developed, in no  acute distress. HEENT: Conjunctivae and lids unremarkable. Cardiovascular: Regular rhythm.  Lungs: Normal work of breathing. Neurologic: No focal deficits.   Lab Results  Component Value Date   CREATININE 0.95 04/15/2020   BUN 18 04/15/2020   NA 139 04/15/2020   K 4.4 04/15/2020   CL 101 04/15/2020   CO2 21 04/15/2020   Lab Results  Component Value Date   ALT 15 04/15/2020   AST 14 04/15/2020   ALKPHOS 83 04/15/2020   BILITOT 0.4 04/15/2020   Lab Results  Component Value Date   HGBA1C 6.1 (H) 04/15/2020   HGBA1C 6.0 (H) 01/30/2020   HGBA1C 6.0 (H) 11/20/2019   HGBA1C 5.9 (H) 05/02/2019   HGBA1C 5.9 (H) 08/10/2018   Lab Results  Component Value Date   INSULIN 20.7 01/30/2020   Lab Results  Component Value Date   TSH 2.590 01/30/2020   Lab Results  Component Value Date   CHOL 178 04/15/2020   HDL 55 04/15/2020   LDLCALC 88 04/15/2020   LDLDIRECT 95.2 09/06/2012   TRIG 209 (H) 04/15/2020   CHOLHDL 3.2 04/15/2020   Lab Results  Component Value Date   WBC 6.7 04/15/2020   HGB 15.8 04/15/2020   HCT 47.9 (H) 04/15/2020   MCV 91 04/15/2020   PLT 308 04/15/2020    Attestation Statements:   Reviewed by clinician on day of visit: allergies, medications, problem list, medical history, surgical history, family history, social history, and previous encounter notes.  Time spent on visit including pre-visit chart review and post-visit care and charting was 33 minutes.   Coral Ceo, am acting as Location manager for Mina Marble, NP.  I have reviewed the above documentation for accuracy and completeness, and I agree with the above. -  Shahan Starks d. Chasin Findling, NP-C

## 2020-05-27 ENCOUNTER — Other Ambulatory Visit: Payer: Self-pay | Admitting: Family Medicine

## 2020-05-27 DIAGNOSIS — I10 Essential (primary) hypertension: Secondary | ICD-10-CM

## 2020-05-27 DIAGNOSIS — E7800 Pure hypercholesterolemia, unspecified: Secondary | ICD-10-CM

## 2020-05-30 ENCOUNTER — Other Ambulatory Visit: Payer: Self-pay

## 2020-05-30 ENCOUNTER — Encounter (INDEPENDENT_AMBULATORY_CARE_PROVIDER_SITE_OTHER): Payer: Self-pay | Admitting: Adult Health

## 2020-05-30 ENCOUNTER — Ambulatory Visit (INDEPENDENT_AMBULATORY_CARE_PROVIDER_SITE_OTHER): Payer: Medicare Other | Admitting: Adult Health

## 2020-05-30 VITALS — BP 117/77 | HR 94 | Temp 97.9°F | Ht 62.0 in | Wt 190.0 lb

## 2020-05-30 DIAGNOSIS — E559 Vitamin D deficiency, unspecified: Secondary | ICD-10-CM | POA: Diagnosis not present

## 2020-05-30 DIAGNOSIS — Z6836 Body mass index (BMI) 36.0-36.9, adult: Secondary | ICD-10-CM

## 2020-05-30 DIAGNOSIS — R7303 Prediabetes: Secondary | ICD-10-CM | POA: Diagnosis not present

## 2020-06-03 NOTE — Progress Notes (Signed)
Chief Complaint:   OBESITY Lori Mooney is here to discuss her progress with her obesity treatment plan along with follow-up of her obesity related diagnoses. [name] is on the Category 1 Plan and states she is following her eating plan approximately 70% of the time. [name] states she is walking.  Today's visit was #: 7 Starting weight: 203 lbs Starting date: 01/30/2020 Today's weight: 190 lbs Today's date: 05/30/2020 Total lbs lost to date: 13 lbs Total lbs lost since last in-office visit: 3  Interim History: Lori Mooney traveled over the Easter Holiday to [place] and [place]. Her mother turned 104![name] Her father will turn 41 this fall. She followed the tenants of category 1 and PC/[place] while traveling. She also increased daily walking while traveling.  Subjective:   1. Pre-diabetes 04/15/2020 BG within normal limits at 91 and A1c 6.1. Cambrey is not on any BG lowering agents.  Lab Results  Component Value Date   HGBA1C 6.1 (H) 04/15/2020   Lab Results  Component Value Date   INSULIN 20.7 01/30/2020    2. Vitamin D deficiency Lori Mooney's Vitamin D level was stable at 40.8 on 01/30/2020. She is currently taking OTC vitamin D 1,000 each day. She denies nausea, vomiting or muscle weakness.  Assessment/Plan:   1. Pre-diabetes [name] will continue to work on weight loss, exercise, and decreasing simple carbohydrates to help decrease the risk of diabetes. Continue category 1 meal plan and daily walking.  2. Vitamin D deficiency Low Vitamin D level contributes to fatigue and are associated with obesity, breast, and colon cancer. She agrees to continue to take OTC Vitamin D @1 ,000 IU everyday and will follow-up for routine testing of Vitamin D, at least 2-3 times per year to avoid over-replacement.  3. Obesity with current BMI 34.8  Lori Mooney is currently in the action stage of change. As such, her goal is to continue with weight loss efforts. She has agreed to the Category 1 Plan.   Exercise goals: As  is  Behavioral modification strategies: increasing lean protein intake, decreasing simple carbohydrates, meal planning and cooking strategies and planning for success.  Lori Mooney has agreed to follow-up with our clinic in 4 weeks. She was informed of the importance of frequent follow-up visits to maximize her success with intensive lifestyle modifications for her multiple health conditions.   Objective:   Blood pressure 117/77, pulse 94, temperature 97.9 F (36.6 C), height 5\' 2"  (1.575 m), weight 190 lb (86.2 kg), SpO2 98 %. Body mass index is 34.75 kg/m.  General: Cooperative, alert, well developed, in no acute distress. HEENT: Conjunctivae and lids unremarkable. Cardiovascular: Regular rhythm.  Lungs: Normal work of breathing. Neurologic: No focal deficits.   Lab Results  Component Value Date   CREATININE 0.95 04/15/2020   BUN 18 04/15/2020   NA 139 04/15/2020   K 4.4 04/15/2020   CL 101 04/15/2020   CO2 21 04/15/2020   Lab Results  Component Value Date   ALT 15 04/15/2020   AST 14 04/15/2020   ALKPHOS 83 04/15/2020   BILITOT 0.4 04/15/2020   Lab Results  Component Value Date   HGBA1C 6.1 (H) 04/15/2020   HGBA1C 6.0 (H) 01/30/2020   HGBA1C 6.0 (H) 11/20/2019   HGBA1C 5.9 (H) 05/02/2019   HGBA1C 5.9 (H) 08/10/2018   Lab Results  Component Value Date   INSULIN 20.7 01/30/2020   Lab Results  Component Value Date   TSH 2.590 01/30/2020   Lab Results  Component Value Date   CHOL  178 04/15/2020   HDL 55 04/15/2020   LDLCALC 88 04/15/2020   LDLDIRECT 95.2 09/06/2012   TRIG 209 (H) 04/15/2020   CHOLHDL 3.2 04/15/2020   Lab Results  Component Value Date   WBC 6.7 04/15/2020   HGB 15.8 04/15/2020   HCT 47.9 (H) 04/15/2020   MCV 91 04/15/2020   PLT 308 04/15/2020    Attestation Statements:   Reviewed by clinician on day of visit: allergies, medications, problem list, medical history, surgical history, family history, social history, and previous encounter  notes.  Time spent on visit including pre-visit chart review and post-visit care and charting was 36 minutes.   Coral Ceo, CMA, am acting as transcriptionist for Mina Marble, NP.  I have reviewed the above documentation for accuracy and completeness, and I agree with the above. -  Gaberiel Youngblood d. Kynli Chou, NP-C

## 2020-06-27 ENCOUNTER — Ambulatory Visit (INDEPENDENT_AMBULATORY_CARE_PROVIDER_SITE_OTHER): Payer: Medicare Other | Admitting: Adult Health

## 2020-06-27 ENCOUNTER — Other Ambulatory Visit: Payer: Self-pay

## 2020-06-27 VITALS — BP 114/76 | HR 95 | Temp 98.9°F | Ht 62.0 in | Wt 190.0 lb

## 2020-06-27 DIAGNOSIS — R7303 Prediabetes: Secondary | ICD-10-CM

## 2020-06-27 DIAGNOSIS — I10 Essential (primary) hypertension: Secondary | ICD-10-CM

## 2020-06-27 DIAGNOSIS — Z6836 Body mass index (BMI) 36.0-36.9, adult: Secondary | ICD-10-CM

## 2020-07-02 NOTE — Progress Notes (Signed)
Chief Complaint:   OBESITY Adaliz is here to discuss her progress with her obesity treatment plan along with follow-up of her obesity related diagnoses. Fujiko is on the Category 1 Plan and states she is following her eating plan approximately 80-90% of the time. Dayne states she is not currently exercising.  Today's visit was #: 8 Starting weight: 203 lbs Starting date: 01/30/2020 Today's weight: 190 lbs Today's date: 06/27/2020 Total lbs lost to date: 13 Total lbs lost since last in-office visit: 0  Interim History: Valentina reports this is day 8 for a "head cold"- Negative home antigen COVID test. She has been less active the last week and half.  Subjective:   1. Essential hypertension Janika's BP/HR are excellent at OV.  BP Readings from Last 3 Encounters:  06/27/20 114/76  05/30/20 117/77  04/30/20 108/63   2. Pre-diabetes Immaculate is not on any BG lowering agents.  Lab Results  Component Value Date   HGBA1C 6.1 (H) 04/15/2020   Lab Results  Component Value Date   INSULIN 20.7 01/30/2020    Assessment/Plan:   1. Essential hypertension Carlita is working on healthy weight loss and exercise to improve blood pressure control. We will watch for signs of hypotension as she continues her lifestyle modifications. --Check fasting labs at next OV. -Continue current anti-hypertensive regimen.  2. Pre-diabetes Akaysha will continue to work on weight loss, exercise, and decreasing simple carbohydrates to help decrease the risk of diabetes.  --Check fasting labs at next OV.  3. Obesity with current BMI 34.8  Lurae is currently in the action stage of change. As such, her goal is to continue with weight loss efforts. She has agreed to the Category 1 Plan.   Alternative Protein Cereal Options Check fasting labs/IC at next OV -pt is aware to arrive 30 minutes prior to OV.  Exercise goals: Advance activity as tolerated.  Behavioral modification strategies: increasing  lean protein intake, decreasing simple carbohydrates, meal planning and cooking strategies, keeping healthy foods in the home and planning for success.  Sahaana has agreed to follow-up with our clinic in 3 weeks- fasting/IC. She was informed of the importance of frequent follow-up visits to maximize her success with intensive lifestyle modifications for her multiple health conditions.   Objective:   Blood pressure 114/76, pulse 95, temperature 98.9 F (37.2 C), height 5\' 2"  (1.575 m), weight 190 lb (86.2 kg), SpO2 95 %. Body mass index is 34.75 kg/m.  General: Cooperative, alert, well developed, in no acute distress. HEENT: Conjunctivae and lids unremarkable. Cardiovascular: Regular rhythm.  Lungs: Normal work of breathing. Neurologic: No focal deficits.   Lab Results  Component Value Date   CREATININE 0.95 04/15/2020   BUN 18 04/15/2020   NA 139 04/15/2020   K 4.4 04/15/2020   CL 101 04/15/2020   CO2 21 04/15/2020   Lab Results  Component Value Date   ALT 15 04/15/2020   AST 14 04/15/2020   ALKPHOS 83 04/15/2020   BILITOT 0.4 04/15/2020   Lab Results  Component Value Date   HGBA1C 6.1 (H) 04/15/2020   HGBA1C 6.0 (H) 01/30/2020   HGBA1C 6.0 (H) 11/20/2019   HGBA1C 5.9 (H) 05/02/2019   HGBA1C 5.9 (H) 08/10/2018   Lab Results  Component Value Date   INSULIN 20.7 01/30/2020   Lab Results  Component Value Date   TSH 2.590 01/30/2020   Lab Results  Component Value Date   CHOL 178 04/15/2020   HDL 55 04/15/2020  LDLCALC 88 04/15/2020   LDLDIRECT 95.2 09/06/2012   TRIG 209 (H) 04/15/2020   CHOLHDL 3.2 04/15/2020   Lab Results  Component Value Date   WBC 6.7 04/15/2020   HGB 15.8 04/15/2020   HCT 47.9 (H) 04/15/2020   MCV 91 04/15/2020   PLT 308 04/15/2020   No results found for: IRON, TIBC, FERRITIN   Attestation Statements:   Reviewed by clinician on day of visit: allergies, medications, problem list, medical history, surgical history, family history,  social history, and previous encounter notes.  Time spent on visit including pre-visit chart review and post-visit care and charting was 28 minutes.   Coral Ceo, CMA, am acting as transcriptionist for Mina Marble, NP.  I have reviewed the above documentation for accuracy and completeness, and I agree with the above. -  Jaiyah Beining d. Mehar Sagen, NP-C

## 2020-07-08 ENCOUNTER — Other Ambulatory Visit: Payer: Self-pay | Admitting: Family Medicine

## 2020-07-08 DIAGNOSIS — Z1231 Encounter for screening mammogram for malignant neoplasm of breast: Secondary | ICD-10-CM

## 2020-07-15 ENCOUNTER — Encounter (INDEPENDENT_AMBULATORY_CARE_PROVIDER_SITE_OTHER): Payer: Self-pay | Admitting: Adult Health

## 2020-07-15 ENCOUNTER — Ambulatory Visit (INDEPENDENT_AMBULATORY_CARE_PROVIDER_SITE_OTHER): Payer: Medicare Other | Admitting: Adult Health

## 2020-07-15 ENCOUNTER — Other Ambulatory Visit: Payer: Self-pay

## 2020-07-15 VITALS — BP 110/74 | HR 80 | Temp 98.0°F | Ht 62.0 in | Wt 190.0 lb

## 2020-07-15 DIAGNOSIS — Z6836 Body mass index (BMI) 36.0-36.9, adult: Secondary | ICD-10-CM | POA: Diagnosis not present

## 2020-07-15 DIAGNOSIS — I10 Essential (primary) hypertension: Secondary | ICD-10-CM | POA: Diagnosis not present

## 2020-07-15 DIAGNOSIS — E7849 Other hyperlipidemia: Secondary | ICD-10-CM | POA: Diagnosis not present

## 2020-07-15 DIAGNOSIS — E559 Vitamin D deficiency, unspecified: Secondary | ICD-10-CM

## 2020-07-15 DIAGNOSIS — R0602 Shortness of breath: Secondary | ICD-10-CM

## 2020-07-15 DIAGNOSIS — R7303 Prediabetes: Secondary | ICD-10-CM

## 2020-07-16 LAB — LIPID PANEL
Chol/HDL Ratio: 3.4 ratio (ref 0.0–4.4)
Cholesterol, Total: 200 mg/dL — ABNORMAL HIGH (ref 100–199)
HDL: 59 mg/dL (ref >39)
LDL Chol Calc (NIH): 104 mg/dL — ABNORMAL HIGH (ref 0–99)
Triglycerides: 220 mg/dL — ABNORMAL HIGH (ref 0–149)
VLDL Cholesterol Cal: 37 mg/dL (ref 5–40)

## 2020-07-16 LAB — COMPREHENSIVE METABOLIC PANEL
ALT: 18 IU/L (ref 0–32)
AST: 18 IU/L (ref 0–40)
Albumin/Globulin Ratio: 2.4 — ABNORMAL HIGH (ref 1.2–2.2)
Albumin: 5 g/dL — ABNORMAL HIGH (ref 3.7–4.7)
Alkaline Phosphatase: 79 IU/L (ref 44–121)
BUN/Creatinine Ratio: 20 (ref 12–28)
BUN: 18 mg/dL (ref 8–27)
Bilirubin Total: 0.5 mg/dL (ref 0.0–1.2)
CO2: 23 mmol/L (ref 20–29)
Calcium: 10.3 mg/dL (ref 8.7–10.3)
Chloride: 101 mmol/L (ref 96–106)
Creatinine, Ser: 0.92 mg/dL (ref 0.57–1.00)
Globulin, Total: 2.1 g/dL (ref 1.5–4.5)
Glucose: 81 mg/dL (ref 65–99)
Potassium: 4.8 mmol/L (ref 3.5–5.2)
Sodium: 140 mmol/L (ref 134–144)
Total Protein: 7.1 g/dL (ref 6.0–8.5)
eGFR: 65 mL/min/{1.73_m2} (ref >59)

## 2020-07-16 LAB — VITAMIN D 25 HYDROXY (VIT D DEFICIENCY, FRACTURES): Vit D, 25-Hydroxy: 43.7 ng/mL (ref 30.0–100.0)

## 2020-07-16 LAB — INSULIN, RANDOM: INSULIN: 23.6 u[IU]/mL (ref 2.6–24.9)

## 2020-07-16 LAB — HEMOGLOBIN A1C
Est. average glucose Bld gHb Est-mCnc: 120 mg/dL
Hgb A1c MFr Bld: 5.8 % — ABNORMAL HIGH (ref 4.8–5.6)

## 2020-07-16 NOTE — Progress Notes (Signed)
Chief Complaint:   OBESITY Lori Mooney is here to discuss her progress with her obesity treatment plan along with follow-up of her obesity related diagnoses. Lori Mooney is on the Category 1 Plan and states she is following her eating plan approximately 85% of the time. Lori Mooney states she is doing low impact aerobics 60 minutes 2 times per week.  Today's visit was #: 9 Starting weight: 203 lbs Starting date: 01/30/2020 Today's weight: 190 lbs Today's date: 07/15/2020 Total lbs lost to date: 13 Total lbs lost since last in-office visit: 0  Interim History: Lori Mooney has been trying to increase regular exercise- doing low impact aerobics 60 minutes 2 times a week.  Subjective:   1. SOBOE (shortness of breath on exertion) Lori Mooney reports mild SOBE. She denies CP with extreme exertion.  Lori Mooney on 01/30/2020 Lori Mooney 1306. Lori Mooney today Lori Mooney 1759- increased Lori Mooney.  2. Essential hypertension BP/HR excellent at OV.  BP Readings from Last 3 Encounters:  07/15/20 110/74  06/27/20 114/76  05/30/20 117/77   3. Pre-diabetes Lori Mooney denies polyphagia. She is not on Metformin.  Lori Mooney  Lori Mooney Value Date   Lori Mooney 5.8 (H) 07/15/2020   Lori Mooney  Lori Mooney Value Date   Lori Mooney 23.6 07/15/2020   Lori Mooney 20.7 01/30/2020   4. Other hyperlipidemia Lori Mooney is on atorvastatin 20 mg QD.  Lori Mooney  Lori Mooney Value Date   Lori Mooney 18 07/15/2020   Lori Mooney 18 07/15/2020   Lori Mooney 79 07/15/2020   Lori Mooney 0.5 07/15/2020   Lori Mooney  Lori Mooney Value Date   Lori Mooney 200 (H) 07/15/2020   Lori Mooney 59 07/15/2020   Lori Mooney 104 (H) 07/15/2020   Lori Mooney 95.2 09/06/2012   Lori Mooney 220 (H) 07/15/2020   Lori Mooney 3.4 07/15/2020   5. Vitamin D deficiency Kaysie's Vitamin D level was 40.8 on 01/30/2020. She is currently taking OTC vitamin D 1,000 IU each day. She denies nausea, vomiting or muscle weakness.  Assessment/Plan:   1. SOBOE (shortness of breath on exertion) Hasset does feel that she gets out of breath more easily that she  used to when she exercises. Chelsee's shortness of breath appears to be obesity related and exercise induced. She has agreed to work on weight loss and gradually increase exercise to treat her exercise induced shortness of breath. Will continue to monitor closely. Check Lori Mooney today.  2. Essential hypertension Yanitza is working on healthy weight loss and exercise to improve blood pressure control. We will watch for signs of hypotension as she continues her lifestyle modifications. Check labs today.  - Comprehensive metabolic panel  3. Pre-diabetes Quanika will continue to work on weight loss, exercise, and decreasing simple carbohydrates to help decrease the risk of diabetes.  Check labs today.  - Hemoglobin A1c - Lori Mooney, random  4. Other hyperlipidemia Cardiovascular risk and specific lipid/LDL goals reviewed.  We discussed several lifestyle modifications today and Tanaisha will continue to work on diet, exercise and weight loss efforts. Orders and follow up as documented in patient record.  Check labs today.  Counseling Intensive lifestyle modifications are the first line treatment for this issue. Dietary changes: Increase soluble fiber. Decrease simple carbohydrates. Exercise changes: Moderate to vigorous-intensity aerobic activity 150 minutes per week if tolerated. Lipid-lowering medications: see documented in medical record.  - Lipid panel  5. Vitamin D deficiency Low Vitamin D level contributes to fatigue and are associated with obesity, breast, and colon cancer. She agrees to continue to take OTC Vitamin D @1 ,000 IU QD and will follow-up for routine testing  of Vitamin D, at least 2-3 times per year to avoid over-replacement. Check labs today.  - VITAMIN D 25 Hydroxy (Vit-D Deficiency, Fractures)  6. Obesity with current BMI 34.9  Rowen is currently in the action stage of change. As such, her goal is to continue with weight loss efforts. She has agreed to the Category 2 Plan + 100  cal.   Due to increased Lori Mooney, change from category 1 to category 2 plus 100 calories.  Exercise goals:  As is  Behavioral modification strategies: increasing lean protein intake, decreasing simple carbohydrates, meal planning and cooking strategies, keeping healthy foods in the home, and planning for success.  Allyana has agreed to follow-up with our clinic in 3 weeks. She was informed of the importance of frequent follow-up visits to maximize her success with intensive lifestyle modifications for her multiple health conditions.   Camelle was informed we would discuss her Lori Mooney at her next visit unless there is a critical issue that needs to be addressed sooner. Bryton agreed to keep her next visit at the agreed upon time to discuss these Mooney.  Objective:   Blood pressure 110/74, pulse 80, temperature 98 F (36.7 C), height 5\' 2"  (1.575 m), weight 190 lb (86.2 kg), SpO2 98 %. Body mass index is 34.75 kg/m.  General: Cooperative, alert, well developed, in no acute distress. HEENT: Conjunctivae and lids unremarkable. Cardiovascular: Regular rhythm.  Lungs: Normal work of breathing. Neurologic: No focal deficits.   Lori Mooney  Lori Mooney Value Date   CREATININE 0.92 07/15/2020   BUN 18 07/15/2020   NA 140 07/15/2020   K 4.8 07/15/2020   CL 101 07/15/2020   CO2 23 07/15/2020   Lori Mooney  Lori Mooney Value Date   Lori Mooney 18 07/15/2020   Lori Mooney 18 07/15/2020   Lori Mooney 79 07/15/2020   Lori Mooney 0.5 07/15/2020   Lori Mooney  Lori Mooney Value Date   Lori Mooney 5.8 (H) 07/15/2020   Lori Mooney 6.1 (H) 04/15/2020   Lori Mooney 6.0 (H) 01/30/2020   Lori Mooney 6.0 (H) 11/20/2019   Lori Mooney 5.9 (H) 05/02/2019   Lori Mooney  Lori Mooney Value Date   Lori Mooney 23.6 07/15/2020   Lori Mooney 20.7 01/30/2020   Lori Mooney  Lori Mooney Value Date   Lori Mooney 2.590 01/30/2020   Lori Mooney  Lori Mooney Value Date   Lori Mooney 200 (H) 07/15/2020   Lori Mooney 59 07/15/2020   Lori Mooney 104 (H) 07/15/2020   Lori Mooney 95.2 09/06/2012    Lori Mooney 220 (H) 07/15/2020   Lori Mooney 3.4 07/15/2020   Lori Mooney  Lori Mooney Value Date   Lori Mooney 6.7 04/15/2020   Lori Mooney 15.8 04/15/2020   Lori Mooney 47.9 (H) 04/15/2020   Lori Mooney 91 04/15/2020   PLT 308 04/15/2020   No Mooney found for: IRON, TIBC, FERRITIN  Obesity Behavioral Intervention:   Approximately 15 minutes were spent on the discussion below.  ASK: We discussed the diagnosis of obesity with Tomi Bamberger today and Oma agreed to give [place] permission to discuss obesity behavioral modification therapy today.  ASSESS: Kamylle has the diagnosis of obesity and her BMI today is 34.9. Issabela is in the action stage of change.   ADVISE: Endya was educated on the multiple health risks of obesity as well as the benefit of weight loss to improve her health. She was advised of the need for long term treatment and the importance of lifestyle modifications to improve her current health and to decrease her risk of future health problems.  AGREE: Multiple dietary modification options and treatment options were discussed and  Disa agreed to follow the recommendations documented in the above note.  ARRANGE: Yareni was educated on the importance of frequent visits to treat obesity as outlined per CMS and USPSTF guidelines and agreed to schedule her next follow up appointment today.  Attestation Statements:   Reviewed by clinician on day of visit: allergies, medications, problem list, medical history, surgical history, family history, social history, and previous encounter notes.  Coral Ceo, CMA, am acting as transcriptionist for Mina Marble, NP.  I have reviewed the above documentation for accuracy and completeness, and I agree with the above. -  Marvene Strohm d. Kalah Pflum, NP-C

## 2020-07-18 ENCOUNTER — Encounter (INDEPENDENT_AMBULATORY_CARE_PROVIDER_SITE_OTHER): Payer: Self-pay | Admitting: Adult Health

## 2020-07-18 NOTE — Telephone Encounter (Signed)
Please advise 

## 2020-08-08 ENCOUNTER — Other Ambulatory Visit: Payer: Self-pay

## 2020-08-08 ENCOUNTER — Encounter (INDEPENDENT_AMBULATORY_CARE_PROVIDER_SITE_OTHER): Payer: Self-pay | Admitting: Adult Health

## 2020-08-08 ENCOUNTER — Ambulatory Visit (INDEPENDENT_AMBULATORY_CARE_PROVIDER_SITE_OTHER): Payer: Medicare Other | Admitting: Adult Health

## 2020-08-08 VITALS — BP 125/79 | HR 83 | Temp 97.8°F | Ht 62.0 in | Wt 192.0 lb

## 2020-08-08 DIAGNOSIS — E7849 Other hyperlipidemia: Secondary | ICD-10-CM | POA: Diagnosis not present

## 2020-08-08 DIAGNOSIS — E559 Vitamin D deficiency, unspecified: Secondary | ICD-10-CM

## 2020-08-08 DIAGNOSIS — R7303 Prediabetes: Secondary | ICD-10-CM | POA: Diagnosis not present

## 2020-08-08 DIAGNOSIS — Z6837 Body mass index (BMI) 37.0-37.9, adult: Secondary | ICD-10-CM | POA: Diagnosis not present

## 2020-08-15 NOTE — Progress Notes (Signed)
Chief Complaint:   OBESITY Lori Mooney is here to discuss her progress with her obesity treatment plan along with follow-up of her obesity related diagnoses. Lori Mooney is on the Category 2 Plan + 100 cal and states she is following her eating plan approximately 80% of the time. Lori Mooney states she is doing low impact aerobics 120 minutes 2 times per week.  Today's visit was #: 10 Starting weight: 203 lbs Starting date: 01/30/2020 Today's weight: 192 lbs Today's date: 08/08/2020 Total lbs lost to date: 11 Total lbs lost since last in-office visit: 0  Interim History: Lori Mooney has been concentrating on increased protein at meals and increasing snack calories to a total of 300/day.  She feels that her clothes are fitting better and her home scale reflects weight loss.  Subjective:   1. Pre-diabetes Discussed labs with patient today. 07/15/2020 BG 81, A1c 5.8 (improved), and insulin level 23.6 (slightly increased).  Lori Mooney is not on any BG lowering prescriptions.  2. Other hyperlipidemia Discussed labs with patient today. 07/15/2020 lipid panel total 200, HDL 59, triglycerides 220, and LDL 104. She has a family history of hypertriglyceridemia- mother.  Pt is on atorvastatin 20 mg. She denies tobacco/vape use.   3. Vitamin D deficiency Discussed labs with patient today. 07/15/20 Vit D level was 43.7- stable, however just below goal of 50. She is currently taking OTC vitamin D 1,000 IU each day. She denies nausea, vomiting or muscle weakness.   Assessment/Plan:   1. Pre-diabetes Lori Mooney will continue to increase protein, work on weight loss, exercise, and decreasing simple carbohydrates to help decrease the risk of diabetes.   2. Other hyperlipidemia Continue statin therapy as directed by PCP. Cardiovascular risk and specific lipid/LDL goals reviewed.  We discussed several lifestyle modifications today and Lori Mooney will continue to work on diet, exercise and weight loss efforts. Orders and follow  up as documented in patient record.   Counseling Intensive lifestyle modifications are the first line treatment for this issue. Dietary changes: Increase soluble fiber. Decrease simple carbohydrates. Exercise changes: Moderate to vigorous-intensity aerobic activity 150 minutes per week if tolerated. Lipid-lowering medications: see documented in medical record.  3. Vitamin D deficiency Low Vitamin D level contributes to fatigue and are associated with obesity, breast, and colon cancer. She agrees to increase OTC Vitamin D to 2,000 IU QD and will follow-up for routine testing of Vitamin D, at least 2-3 times per year to avoid over-replacement.  4. Current BMI 35.1  Lori Mooney is currently in the action stage of change. As such, her goal is to continue with weight loss efforts. She has agreed to the Category 2 Plan. No additional snack cal- please keep to <200 snack cal/day  Exercise goals:  As is  Behavioral modification strategies: increasing lean protein intake, decreasing simple carbohydrates, meal planning and cooking strategies, keeping healthy foods in the home, and planning for success.  Lori Mooney has agreed to follow-up with our clinic in 3 weeks. She was informed of the importance of frequent follow-up visits to maximize her success with intensive lifestyle modifications for her multiple health conditions.   Objective:   Blood pressure 125/79, pulse 83, temperature 97.8 F (36.6 C), height 5\' 2"  (1.575 m), weight 192 lb (87.1 kg), SpO2 98 %. Body mass index is 35.12 kg/m.  General: Cooperative, alert, well developed, in no acute distress. HEENT: Conjunctivae and lids unremarkable. Cardiovascular: Regular rhythm.  Lungs: Normal work of breathing. Neurologic: No focal deficits.   Lab Results  Component Value  Date   CREATININE 0.92 07/15/2020   BUN 18 07/15/2020   NA 140 07/15/2020   K 4.8 07/15/2020   CL 101 07/15/2020   CO2 23 07/15/2020   Lab Results  Component Value Date    ALT 18 07/15/2020   AST 18 07/15/2020   ALKPHOS 79 07/15/2020   BILITOT 0.5 07/15/2020   Lab Results  Component Value Date   HGBA1C 5.8 (H) 07/15/2020   HGBA1C 6.1 (H) 04/15/2020   HGBA1C 6.0 (H) 01/30/2020   HGBA1C 6.0 (H) 11/20/2019   HGBA1C 5.9 (H) 05/02/2019   Lab Results  Component Value Date   INSULIN 23.6 07/15/2020   INSULIN 20.7 01/30/2020   Lab Results  Component Value Date   TSH 2.590 01/30/2020   Lab Results  Component Value Date   CHOL 200 (H) 07/15/2020   HDL 59 07/15/2020   LDLCALC 104 (H) 07/15/2020   LDLDIRECT 95.2 09/06/2012   TRIG 220 (H) 07/15/2020   CHOLHDL 3.4 07/15/2020   Lab Results  Component Value Date   VD25OH 43.7 07/15/2020   VD25OH 40.8 01/30/2020   VD25OH 43.1 05/02/2019   Lab Results  Component Value Date   WBC 6.7 04/15/2020   HGB 15.8 04/15/2020   HCT 47.9 (H) 04/15/2020   MCV 91 04/15/2020   PLT 308 04/15/2020   No results found for: IRON, TIBC, FERRITIN  Obesity Behavioral Intervention:   Approximately 15 minutes were spent on the discussion below.  ASK: We discussed the diagnosis of obesity with Lori Mooney today and Lori Mooney agreed to give [place] permission to discuss obesity behavioral modification therapy today.  ASSESS: Lori Mooney has the diagnosis of obesity and her BMI today is 35.1. Lori Mooney is in the action stage of change.   ADVISE: Lori Mooney was educated on the multiple health risks of obesity as well as the benefit of weight loss to improve her health. She was advised of the need for long term treatment and the importance of lifestyle modifications to improve her current health and to decrease her risk of future health problems.  AGREE: Multiple dietary modification options and treatment options were discussed and Lori Mooney agreed to follow the recommendations documented in the above note.  ARRANGE: Lori Mooney was educated on the importance of frequent visits to treat obesity as outlined per CMS and USPSTF guidelines and agreed  to schedule her next follow up appointment today.  Attestation Statements:   Reviewed by clinician on day of visit: allergies, medications, problem list, medical history, surgical history, family history, social history, and previous encounter notes.  Coral Ceo, CMA, am acting as transcriptionist for Mina Marble, NP.  I have reviewed the above documentation for accuracy and completeness, and I agree with the above. -  Arionne Iams d. Navdeep Fessenden, NP-C

## 2020-08-29 ENCOUNTER — Other Ambulatory Visit: Payer: Self-pay

## 2020-08-29 ENCOUNTER — Encounter (INDEPENDENT_AMBULATORY_CARE_PROVIDER_SITE_OTHER): Payer: Self-pay | Admitting: Adult Health

## 2020-08-29 ENCOUNTER — Ambulatory Visit (INDEPENDENT_AMBULATORY_CARE_PROVIDER_SITE_OTHER): Payer: Medicare Other | Admitting: Adult Health

## 2020-08-29 VITALS — BP 112/71 | HR 90 | Temp 98.0°F | Ht 62.0 in | Wt 192.0 lb

## 2020-08-29 DIAGNOSIS — Z6837 Body mass index (BMI) 37.0-37.9, adult: Secondary | ICD-10-CM | POA: Diagnosis not present

## 2020-08-29 DIAGNOSIS — R7303 Prediabetes: Secondary | ICD-10-CM

## 2020-08-29 DIAGNOSIS — I10 Essential (primary) hypertension: Secondary | ICD-10-CM

## 2020-08-30 ENCOUNTER — Ambulatory Visit
Admission: RE | Admit: 2020-08-30 | Discharge: 2020-08-30 | Disposition: A | Discharge status: Home / Self Care | Payer: Medicare Other | Source: Ambulatory Visit | Attending: Family Medicine | Admitting: Family Medicine

## 2020-08-30 DIAGNOSIS — Z1231 Encounter for screening mammogram for malignant neoplasm of breast: Secondary | ICD-10-CM

## 2020-09-03 NOTE — Progress Notes (Signed)
Chief Complaint:   OBESITY Lori Mooney is here to discuss her progress with her obesity treatment plan along with follow-up of her obesity related diagnoses. Lori Mooney is on the Category 2 Plan and states she is following her eating plan approximately 80% of the time. Diary states she is doing 0 minutes 0 times per week.  Today's visit was #: 11 Starting weight: 203 lbs Starting date: 01/30/2020 Today's weight: 189 lbs Today's date: 08/29/2020 Total lbs lost to date: 14 Total lbs lost since last in-office visit: 3  Interim History: Lori Mooney reports increased "boredom hunger" the last several weeks. She is down 3 lbs since last OV.  Subjective:   1. Essential hypertension Lori Mooney's blood pressure/heart rate is excellent at her office visit. She is on Lisinopril/HCTZ 20/12.'5mg'$  QD. She denies acute cardiac sx's.  2. Pre-diabetes Lori Mooney last A1c was 5.8 on 07/15/2020, which improved from previous check of 6.1 on 04/15/2020. She is not on any BG lowering medications.  Assessment/Plan:   1. Essential hypertension Lori Mooney will continue Zestoretic 20-12.5 mg and her Category 2 meal plan. She will continue working on healthy weight loss and exercise to improve blood pressure control. We will watch for signs of hypotension as she continues her lifestyle modifications.  2. Pre-diabetes Lori Mooney is to increase protein at meals and choose higher protein snacks. She will continue to work on weight loss, exercise, and decreasing simple carbohydrates to help decrease the risk of diabetes.   3. Current BMI 34.7 Lori Mooney is currently in the action stage of change. As such, her goal is to continue with weight loss efforts. She has agreed to the Category 2 Plan.   Lori Mooney is to increase protein at each meal and choose higher protein snacks.  Exercise goals: Increase daily walking and band exercise 2 times per week.  Behavioral modification strategies: increasing lean protein intake, decreasing simple  carbohydrates, meal planning and cooking strategies, keeping healthy foods in the home, and planning for success.  Lori Mooney has agreed to follow-up with our clinic in 3 weeks. She was informed of the importance of frequent follow-up visits to maximize her success with intensive lifestyle modifications for her multiple health conditions.   Objective:   Blood pressure 112/71, pulse 90, temperature 98 F (36.7 C), height '5\' 2"'$  (1.575 m), weight 192 lb (87.1 kg), SpO2 97 %. Body mass index is 35.12 kg/m.  General: Cooperative, alert, well developed, in no acute distress. HEENT: Conjunctivae and lids unremarkable. Cardiovascular: Regular rhythm.  Lungs: Normal work of breathing. Neurologic: No focal deficits.   Lab Results  Component Value Date   CREATININE 0.92 07/15/2020   BUN 18 07/15/2020   NA 140 07/15/2020   K 4.8 07/15/2020   CL 101 07/15/2020   CO2 23 07/15/2020   Lab Results  Component Value Date   ALT 18 07/15/2020   AST 18 07/15/2020   ALKPHOS 79 07/15/2020   BILITOT 0.5 07/15/2020   Lab Results  Component Value Date   HGBA1C 5.8 (H) 07/15/2020   HGBA1C 6.1 (H) 04/15/2020   HGBA1C 6.0 (H) 01/30/2020   HGBA1C 6.0 (H) 11/20/2019   HGBA1C 5.9 (H) 05/02/2019   Lab Results  Component Value Date   INSULIN 23.6 07/15/2020   INSULIN 20.7 01/30/2020   Lab Results  Component Value Date   TSH 2.590 01/30/2020   Lab Results  Component Value Date   CHOL 200 (H) 07/15/2020   HDL 59 07/15/2020   LDLCALC 104 (H) 07/15/2020   LDLDIRECT 95.2  09/06/2012   TRIG 220 (H) 07/15/2020   CHOLHDL 3.4 07/15/2020   Lab Results  Component Value Date   VD25OH 43.7 07/15/2020   VD25OH 40.8 01/30/2020   VD25OH 43.1 05/02/2019   Lab Results  Component Value Date   WBC 6.7 04/15/2020   HGB 15.8 04/15/2020   HCT 47.9 (H) 04/15/2020   MCV 91 04/15/2020   PLT 308 04/15/2020   No results found for: IRON, TIBC, FERRITIN  Attestation Statements:   Reviewed by clinician on day  of visit: allergies, medications, problem list, medical history, surgical history, family history, social history, and previous encounter notes.  Time spent on visit including pre-visit chart review and post-visit care and charting was 28 minutes.    Wilhemena Durie, am acting as transcriptionist for Mina Marble, NP.  I have reviewed the above documentation for accuracy and completeness, and I agree with the above. -  Claud Gowan d. Akari Defelice, NP-C

## 2020-09-24 DIAGNOSIS — D123 Benign neoplasm of transverse colon: Secondary | ICD-10-CM | POA: Diagnosis not present

## 2020-09-24 DIAGNOSIS — K635 Polyp of colon: Secondary | ICD-10-CM | POA: Diagnosis not present

## 2020-09-24 DIAGNOSIS — Z86010 Personal history of colonic polyps: Secondary | ICD-10-CM | POA: Diagnosis not present

## 2020-09-26 ENCOUNTER — Other Ambulatory Visit: Payer: Self-pay

## 2020-09-26 ENCOUNTER — Ambulatory Visit (INDEPENDENT_AMBULATORY_CARE_PROVIDER_SITE_OTHER): Payer: Medicare Other | Admitting: Adult Health

## 2020-09-26 VITALS — BP 127/79 | HR 82 | Temp 98.0°F | Ht 62.0 in | Wt 189.0 lb

## 2020-09-26 DIAGNOSIS — K635 Polyp of colon: Secondary | ICD-10-CM | POA: Diagnosis not present

## 2020-09-26 DIAGNOSIS — Z9889 Other specified postprocedural states: Secondary | ICD-10-CM

## 2020-09-26 DIAGNOSIS — I10 Essential (primary) hypertension: Secondary | ICD-10-CM

## 2020-09-26 DIAGNOSIS — Z6836 Body mass index (BMI) 36.0-36.9, adult: Secondary | ICD-10-CM | POA: Diagnosis not present

## 2020-09-26 DIAGNOSIS — H2511 Age-related nuclear cataract, right eye: Secondary | ICD-10-CM | POA: Diagnosis not present

## 2020-09-26 DIAGNOSIS — H35372 Puckering of macula, left eye: Secondary | ICD-10-CM | POA: Diagnosis not present

## 2020-09-26 DIAGNOSIS — Z961 Presence of intraocular lens: Secondary | ICD-10-CM | POA: Diagnosis not present

## 2020-09-26 DIAGNOSIS — D123 Benign neoplasm of transverse colon: Secondary | ICD-10-CM | POA: Diagnosis not present

## 2020-09-26 NOTE — Progress Notes (Signed)
Chief Complaint:   OBESITY Lori Mooney is here to discuss her progress with her obesity treatment plan along with follow-up of her obesity related diagnoses. Lori Mooney is on the Category 2 Plan and states she is following her eating plan approximately 80% of the time. Lori Mooney states she has not exercised this month.  Today's visit was #: 12 Starting weight: 203 lbs Starting date: 01/30/2020 Today's weight: 189 lbs Today's date: 09/26/2020 Total lbs lost to date: 14 lbs Total lbs lost since last in-office visit: 3 lbs  Interim History: Lori Mooney had a Colonoscopy Tuesday, 09/24/2020, and had 4 polyps removed.   Paternal grandfather passed away from colon cancer at age 50. he recently had new smart appliances installed in her kitchen-double oven, refrigerator, dishwasher.  She has been adapting to the new technology when cooking and cleaning.  Subjective:   1. Essential hypertension Ambulatory readings show SBP 110s, DBP 70s. She denies dizziness with position change. She denies extreme fatigue.  BP Readings from Last 3 Encounters:  09/26/20 127/79  08/29/20 112/71  08/08/20 125/79   2. Status post colonoscopy Family history - paternal grandfather - passed away from colon cancer at the age of 53.  Assessment/Plan:   1. Essential hypertension Continue lisinopril/HCTZ 20/12.5 mg daily.  2. Status post colonoscopy Follow-up with GI as directed.  3. Obesity with current BMI 34.7  Lori Mooney is currently in the action stage of change. As such, her goal is to continue with weight loss efforts. She has agreed to the Category 2 Plan.   Exercise goals: Older adults should follow the adult guidelines. When older adults cannot meet the adult guidelines, they should be as physically active as their abilities and conditions will allow.  Older adults should do exercises that maintain or improve balance if they are at risk of falling.   Behavioral modification strategies: increasing lean protein  intake, decreasing simple carbohydrates, meal planning and cooking strategies, keeping healthy foods in the home, and planning for success.  Lori Mooney has agreed to follow-up with our clinic in 3-4 weeks. She was informed of the importance of frequent follow-up visits to maximize her success with intensive lifestyle modifications for her multiple health conditions.   Objective:   Blood pressure 127/79, pulse 82, temperature 98 F (36.7 C), height '5\' 2"'$  (1.575 m), weight 189 lb (85.7 kg), SpO2 96 %. Body mass index is 34.57 kg/m.  General: Cooperative, alert, well developed, in no acute distress. HEENT: Conjunctivae and lids unremarkable. Cardiovascular: Regular rhythm.  Lungs: Normal work of breathing. Neurologic: No focal deficits.   Lab Results  Component Value Date   CREATININE 0.92 07/15/2020   BUN 18 07/15/2020   NA 140 07/15/2020   K 4.8 07/15/2020   CL 101 07/15/2020   CO2 23 07/15/2020   Lab Results  Component Value Date   ALT 18 07/15/2020   AST 18 07/15/2020   ALKPHOS 79 07/15/2020   BILITOT 0.5 07/15/2020   Lab Results  Component Value Date   HGBA1C 5.8 (H) 07/15/2020   HGBA1C 6.1 (H) 04/15/2020   HGBA1C 6.0 (H) 01/30/2020   HGBA1C 6.0 (H) 11/20/2019   HGBA1C 5.9 (H) 05/02/2019   Lab Results  Component Value Date   INSULIN 23.6 07/15/2020   INSULIN 20.7 01/30/2020   Lab Results  Component Value Date   TSH 2.590 01/30/2020   Lab Results  Component Value Date   CHOL 200 (H) 07/15/2020   HDL 59 07/15/2020   LDLCALC 104 (H) 07/15/2020  LDLDIRECT 95.2 09/06/2012   TRIG 220 (H) 07/15/2020   CHOLHDL 3.4 07/15/2020   Lab Results  Component Value Date   VD25OH 43.7 07/15/2020   VD25OH 40.8 01/30/2020   VD25OH 43.1 05/02/2019   Lab Results  Component Value Date   WBC 6.7 04/15/2020   HGB 15.8 04/15/2020   HCT 47.9 (H) 04/15/2020   MCV 91 04/15/2020   PLT 308 04/15/2020   Attestation Statements:   Reviewed by clinician on day of visit:  allergies, medications, problem list, medical history, surgical history, family history, social history, and previous encounter notes.  Time spent on visit including pre-visit chart review and post-visit care and charting was 30 minutes.   I, Water quality scientist, CMA, am acting as Location manager for Mina Marble, NP.  I have reviewed the above documentation for accuracy and completeness, and I agree with the above. -  Donya Tomaro d. Tawfiq Favila, NP-C

## 2020-10-01 LAB — HM COLONOSCOPY

## 2020-10-17 ENCOUNTER — Ambulatory Visit (INDEPENDENT_AMBULATORY_CARE_PROVIDER_SITE_OTHER): Payer: Medicare Other | Admitting: Adult Health

## 2020-10-17 ENCOUNTER — Other Ambulatory Visit: Payer: Self-pay

## 2020-10-17 ENCOUNTER — Encounter (INDEPENDENT_AMBULATORY_CARE_PROVIDER_SITE_OTHER): Payer: Self-pay | Admitting: Adult Health

## 2020-10-17 VITALS — BP 102/67 | HR 76 | Temp 97.7°F | Ht 62.0 in | Wt 191.0 lb

## 2020-10-17 DIAGNOSIS — Z6836 Body mass index (BMI) 36.0-36.9, adult: Secondary | ICD-10-CM

## 2020-10-17 DIAGNOSIS — R7303 Prediabetes: Secondary | ICD-10-CM

## 2020-10-17 DIAGNOSIS — E559 Vitamin D deficiency, unspecified: Secondary | ICD-10-CM | POA: Diagnosis not present

## 2020-10-17 DIAGNOSIS — E7849 Other hyperlipidemia: Secondary | ICD-10-CM

## 2020-10-17 NOTE — Progress Notes (Signed)
Chief Complaint:   OBESITY Lori Mooney is here to discuss her progress with her obesity treatment plan along with follow-up of her obesity related diagnoses. Lori Mooney is on the Category 2 Plan and states she is following her eating plan approximately 80% of the time. Lori Mooney states she is doing aerobics for 60 minutes 2 times per week.  Today's visit was #: 69 Starting weight: 203 lbs Starting date: 01/30/2020 Today's weight: 191 lbs Today's date: 10/17/2020 Total lbs lost to date: 12 lbs Total lbs lost since last in-office visit: 0  Interim History: Lori Mooney has resumed aerobic exercise - 2 times per week - [employer]. She has an office visit with Dr. Wiliam Ke - 10/24/2020.   Last reviewed bioimpedance results with patient.  Subjective:   1. Prediabetes On 07/15/2020, BG 81, A1c 5.8, insulin level 23.6. BG/A1c - both decreased with increased insulin level. On 07/15/2020, CMP - GFR 65. She denies family history of MTC or personal history of pancreatitis. Discussed risks/benefits of GLP-1 and biguanide therapy.  Lab Results  Component Value Date   HGBA1C 5.8 (H) 07/15/2020   Lab Results  Component Value Date   INSULIN 23.6 07/15/2020   INSULIN 20.7 01/30/2020   2. Other hyperlipidemia She is on atorvastatin 20 mg daily - managed by PCP.  Lab Results  Component Value Date   ALT 18 07/15/2020   AST 18 07/15/2020   ALKPHOS 79 07/15/2020   BILITOT 0.5 07/15/2020   Lab Results  Component Value Date   CHOL 200 (H) 07/15/2020   HDL 59 07/15/2020   LDLCALC 104 (H) 07/15/2020   LDLDIRECT 95.2 09/06/2012   TRIG 220 (H) 07/15/2020   CHOLHDL 3.4 07/15/2020   3. Vitamin D deficiency On 07/15/2020, vitamin D level 43.7.  She is on OTC vitamin D3 1,000 IU daily.    Lab Results  Component Value Date   VD25OH 43.7 07/15/2020   VD25OH 40.8 01/30/2020   VD25OH 43.1 05/02/2019   Assessment/Plan:   1. Prediabetes Check fasting labs with PCP at next office visit.  -  Insulin, random; Future  Consider initiating either GLP-1 and biguanide therapy at next OV.  2. Other hyperlipidemia Check fasting labs with PCP at next office visit.  3. Vitamin D deficiency Check vitamin D level at next office visit.  - VITAMIN D 25 Hydroxy (Vit-D Deficiency, Fractures); Future  4. Obesity with current BMI 35.0  Lori Mooney is currently in the action stage of change. As such, her goal is to continue with weight loss efforts. She has agreed to the Category 2 Plan.   Exercise goals:  As is.  Behavioral modification strategies: increasing lean protein intake, decreasing simple carbohydrates, meal planning and cooking strategies, keeping healthy foods in the home, and planning for success.  Check fasting labs at next office visit.  Lori Mooney has agreed to follow-up with our clinic in 4 weeks. She was informed of the importance of frequent follow-up visits to maximize her success with intensive lifestyle modifications for her multiple health conditions.   Objective:   Blood pressure 102/67, pulse 76, temperature 97.7 F (36.5 C), height 5\' 2"  (1.575 m), weight 191 lb (86.6 kg), SpO2 98 %. Body mass index is 34.93 kg/m.  General: Cooperative, alert, well developed, in no acute distress. HEENT: Conjunctivae and lids unremarkable. Cardiovascular: Regular rhythm.  Lungs: Normal work of breathing. Neurologic: No focal deficits.   Lab Results  Component Value Date   CREATININE 0.92 07/15/2020   BUN 18  07/15/2020   NA 140 07/15/2020   K 4.8 07/15/2020   CL 101 07/15/2020   CO2 23 07/15/2020   Lab Results  Component Value Date   ALT 18 07/15/2020   AST 18 07/15/2020   ALKPHOS 79 07/15/2020   BILITOT 0.5 07/15/2020   Lab Results  Component Value Date   HGBA1C 5.8 (H) 07/15/2020   HGBA1C 6.1 (H) 04/15/2020   HGBA1C 6.0 (H) 01/30/2020   HGBA1C 6.0 (H) 11/20/2019   HGBA1C 5.9 (H) 05/02/2019   Lab Results  Component Value Date   INSULIN 23.6 07/15/2020    INSULIN 20.7 01/30/2020   Lab Results  Component Value Date   TSH 2.590 01/30/2020   Lab Results  Component Value Date   CHOL 200 (H) 07/15/2020   HDL 59 07/15/2020   LDLCALC 104 (H) 07/15/2020   LDLDIRECT 95.2 09/06/2012   TRIG 220 (H) 07/15/2020   CHOLHDL 3.4 07/15/2020   Lab Results  Component Value Date   VD25OH 43.7 07/15/2020   VD25OH 40.8 01/30/2020   VD25OH 43.1 05/02/2019   Lab Results  Component Value Date   WBC 6.7 04/15/2020   HGB 15.8 04/15/2020   HCT 47.9 (H) 04/15/2020   MCV 91 04/15/2020   PLT 308 04/15/2020   Attestation Statements:   Reviewed by clinician on day of visit: allergies, medications, problem list, medical history, surgical history, family history, social history, and previous encounter notes.  Time spent on visit including pre-visit chart review and post-visit care and charting was 30 minutes.   I, Water quality scientist, CMA, am acting as Location manager for Mina Marble, NP.  I have reviewed the above documentation for accuracy and completeness, and I agree with the above. -  Heston Widener d. Analyn Matusek, NP-C

## 2020-10-22 ENCOUNTER — Other Ambulatory Visit: Payer: Self-pay

## 2020-10-22 ENCOUNTER — Other Ambulatory Visit: Payer: Self-pay | Admitting: *Deleted

## 2020-10-22 ENCOUNTER — Other Ambulatory Visit (INDEPENDENT_AMBULATORY_CARE_PROVIDER_SITE_OTHER): Payer: Medicare Other

## 2020-10-22 DIAGNOSIS — R7303 Prediabetes: Secondary | ICD-10-CM

## 2020-10-22 DIAGNOSIS — E7849 Other hyperlipidemia: Secondary | ICD-10-CM

## 2020-10-22 DIAGNOSIS — E781 Pure hyperglyceridemia: Secondary | ICD-10-CM

## 2020-10-22 DIAGNOSIS — Z6836 Body mass index (BMI) 36.0-36.9, adult: Secondary | ICD-10-CM

## 2020-10-22 LAB — LDL CHOLESTEROL, DIRECT: Direct LDL: 89 mg/dL

## 2020-10-22 LAB — LIPID PANEL
Cholesterol: 181 mg/dL (ref 0–200)
HDL: 55.3 mg/dL (ref >39.00)
NonHDL: 125.69
Total CHOL/HDL Ratio: 3
Triglycerides: 218 mg/dL — ABNORMAL HIGH (ref 0.0–149.0)
VLDL: 43.6 mg/dL — ABNORMAL HIGH (ref 0.0–40.0)

## 2020-10-22 LAB — CBC WITH DIFFERENTIAL/PLATELET
Basophils Absolute: 0.1 10*3/uL (ref 0.0–0.1)
Basophils Relative: 0.9 % (ref 0.0–3.0)
Eosinophils Absolute: 0.2 10*3/uL (ref 0.0–0.7)
Eosinophils Relative: 3.3 % (ref 0.0–5.0)
HCT: 45 % (ref 36.0–46.0)
Hemoglobin: 14.9 g/dL (ref 12.0–15.0)
Lymphocytes Relative: 35 % (ref 12.0–46.0)
Lymphs Abs: 2.1 10*3/uL (ref 0.7–4.0)
MCHC: 33.1 g/dL (ref 30.0–36.0)
MCV: 89.6 fl (ref 78.0–100.0)
Monocytes Absolute: 0.5 10*3/uL (ref 0.1–1.0)
Monocytes Relative: 8.7 % (ref 3.0–12.0)
Neutro Abs: 3.1 10*3/uL (ref 1.4–7.7)
Neutrophils Relative %: 52.1 % (ref 43.0–77.0)
Platelets: 273 10*3/uL (ref 150.0–400.0)
RBC: 5.02 Mil/uL (ref 3.87–5.11)
RDW: 13.6 % (ref 11.5–15.5)
WBC: 6 10*3/uL (ref 4.0–10.5)

## 2020-10-22 LAB — HEMOGLOBIN A1C: Hgb A1c MFr Bld: 5.9 % (ref 4.6–6.5)

## 2020-10-24 ENCOUNTER — Encounter: Payer: Self-pay | Admitting: Family Medicine

## 2020-10-24 ENCOUNTER — Other Ambulatory Visit: Payer: Self-pay

## 2020-10-24 ENCOUNTER — Ambulatory Visit (INDEPENDENT_AMBULATORY_CARE_PROVIDER_SITE_OTHER): Payer: Medicare Other | Admitting: Family Medicine

## 2020-10-24 VITALS — BP 122/88 | HR 89 | Temp 97.6°F | Ht 61.5 in | Wt 193.0 lb

## 2020-10-24 DIAGNOSIS — R7303 Prediabetes: Secondary | ICD-10-CM | POA: Diagnosis not present

## 2020-10-24 DIAGNOSIS — E559 Vitamin D deficiency, unspecified: Secondary | ICD-10-CM

## 2020-10-24 DIAGNOSIS — I10 Essential (primary) hypertension: Secondary | ICD-10-CM | POA: Diagnosis not present

## 2020-10-24 DIAGNOSIS — Z23 Encounter for immunization: Secondary | ICD-10-CM

## 2020-10-24 DIAGNOSIS — E7800 Pure hypercholesterolemia, unspecified: Secondary | ICD-10-CM

## 2020-10-24 LAB — COMPREHENSIVE METABOLIC PANEL
ALT: 17 U/L (ref 0–35)
AST: 15 U/L (ref 0–37)
Albumin: 4.7 g/dL (ref 3.5–5.2)
Alkaline Phosphatase: 67 U/L (ref 39–117)
BUN: 18 mg/dL (ref 6–23)
CO2: 26 mEq/L (ref 19–32)
Calcium: 10.3 mg/dL (ref 8.4–10.5)
Chloride: 102 mEq/L (ref 96–112)
Creatinine, Ser: 0.91 mg/dL (ref 0.40–1.20)
GFR: 61.95 mL/min (ref >60.00)
Glucose, Bld: 93 mg/dL (ref 70–99)
Potassium: 4.5 mEq/L (ref 3.5–5.1)
Sodium: 139 mEq/L (ref 135–145)
Total Bilirubin: 0.5 mg/dL (ref 0.2–1.2)
Total Protein: 7 g/dL (ref 6.0–8.3)

## 2020-10-24 LAB — VITAMIN D 25 HYDROXY (VIT D DEFICIENCY, FRACTURES): VITD: 45.66 ng/mL (ref 30.00–100.00)

## 2020-10-24 MED ORDER — ATORVASTATIN CALCIUM 20 MG PO TABS: 20.0000 mg | ORAL_TABLET | Freq: Every day | ORAL | 1 refills | Status: DC

## 2020-10-24 MED ORDER — LISINOPRIL-HYDROCHLOROTHIAZIDE 20-12.5 MG PO TABS: 1.0000 | ORAL_TABLET | Freq: Every day | ORAL | 1 refills | Status: DC

## 2020-10-24 NOTE — Progress Notes (Signed)
Subjective:  Patient ID: Lori Mooney, female    DOB: Aug 07, 1945  Age: 75 y.o. MRN: [id number]  CC:  Chief Complaint  Patient presents with   Follow-up    6 month f/u  and flu shot    HPI Lori Mooney presents for   Prediabetes: Followed by [facility], last visit September 22.  Lost 15#, then plateau. Considering initiation of GLP-1 and biguanide therapy at follow-up visit, 4 weeks.  Insulin level ordered as well as vitamin D level for next visit -or for me to check.  Plan for higher dose of vitamin D - 2000u, currently on 1000units.  Per pt vit D level was requested.   Last vitamin D Lab Results  Component Value Date   VD25OH 45.66 10/24/2020   Lab Results  Component Value Date   HGBA1C 5.9 10/22/2020   Wt Readings from Last 3 Encounters:  10/24/20 193 lb (87.5 kg)  10/17/20 191 lb (86.6 kg)  09/26/20 189 lb (85.7 kg)  Weight 194 on 04/15/20 Weight 208 on 01/11/20   Hypertension: Lisinopril HCTZ 20/12.5 mg daily.  Home readings: 107/70. Occasional vertigo or lightheadedness  - systolic as low as 98.  BP Readings from Last 3 Encounters:  10/24/20 122/88  10/17/20 102/67  09/26/20 127/79   Lab Results  Component Value Date   CREATININE 0.91 10/24/2020   GERD Pepcid as needed at night - working well.   Hyperlipidemia: Lipitor 20 mg daily Lab Results  Component Value Date   CHOL 181 10/22/2020   HDL 55.30 10/22/2020   LDLCALC 104 (H) 07/15/2020   LDLDIRECT 89.0 10/22/2020   TRIG 218.0 (H) 10/22/2020   CHOLHDL 3 10/22/2020   Lab Results  Component Value Date   ALT 17 10/24/2020   AST 15 10/24/2020   ALKPHOS 67 10/24/2020   BILITOT 0.5 10/24/2020     History Patient Active Problem List   Diagnosis Date Noted   Status post colonoscopy 09/26/2020   Insulin resistance 02/28/2020   Other hyperlipidemia 02/14/2020   Nonspecific abnormal electrocardiogram (ECG) 02/14/2020   Essential hypertension  01/30/2020   Hypertriglyceridemia 01/30/2020   Other fatigue 01/30/2020   Prediabetes 01/30/2020   Osteoarthritis 01/30/2020   Vitamin D deficiency 01/30/2020   SOBOE (shortness of breath on exertion) 01/30/2020   H/O total knee replacement, left 08/24/2018   Osteoarthritis of spine with radiculopathy, lumbar region 06/25/2017   Lumbar pain 06/25/2017   Spinal stenosis of lumbar region without neurogenic claudication 06/25/2017   Sciatica of right side 06/19/2017   GERD (gastroesophageal reflux disease) 09/19/2013   Vertigo 03/02/2012   Hypercholesterolemia 10/08/2010   Benign hypertensive heart disease without heart failure 10/08/2010   Class 2 severe obesity with serious comorbidity and body mass index (BMI) of 37.0 to 37.9 in adult [facility]) 10/08/2010   Past Medical History:  Diagnosis Date   Allergy    Arthritis    history of arthritis in the fingers   Cataract    Constipation    DDD (degenerative disc disease), lumbar    Exogenous obesity    GERD (gastroesophageal reflux disease)    Heartburn    Hypercholesterolemia    Hyperlipidemia    hypercholesterolemia   Hypertension    Hypertriglyceridemia    Osteoarthritis    Palpitations    Prediabetes    SOB (shortness of breath) on exertion    Swelling of both lower extremities    Past Surgical History:  Procedure Laterality Date  CATARACT EXTRACTION W/ INTRAOCULAR LENS IMPLANT Left    DILITATION & CURRETTAGE/HYSTROSCOPY WITH VERSAPOINT RESECTION N/A 08/25/2012   Procedure: DILATATION & CURETTAGE/HYSTEROSCOPY WITH VERSAPOINT RESECTION;  Surgeon: Princess Bruins, MD;  Location: [facility];  Service: Gynecology;  Laterality: N/A;   EYE SURGERY     LUMBAR EPIDURAL INJECTION  08/04/2017   TONSILLECTOMY     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   Procedure: TOTAL KNEE ARTHROPLASTY;  Surgeon: [name], MD;  Location: [facility];  Service: Orthopedics;  Laterality: Left;  175min   TUBAL LIGATION     Allergies  Allergen  Reactions   Shellfish Allergy Hives   Strawberry Extract Hives   Prior to Admission medications   Medication Sig Start Date End Date Taking? Authorizing Provider  acetaminophen (TYLENOL) 325 MG tablet Take 650 mg by mouth every 6 (six) hours as needed.   Yes [provider]  atorvastatin (LIPITOR) 20 MG tablet Take 1 tablet (20 mg total) by mouth daily. 04/19/20  Yes [name], MD  Biotin w/ Vitamins C & E (HAIR/SKIN/NAILS PO) Take by mouth. Natures Bounty   Yes [provider]  Cholecalciferol (VITAMIN D-3) 1000 UNITS CAPS Take 1,000 Units by mouth daily.   Yes [provider]  famotidine (PEPCID) 10 MG tablet Take 10 mg by mouth at bedtime.    Yes [provider]  lisinopril-hydrochlorothiazide (ZESTORETIC) 20-12.5 MG tablet Take 1 tablet by mouth daily. 04/19/20  Yes [name], MD  loratadine (CLARITIN) 10 MG tablet Take 10 mg by mouth daily as needed for allergies.    Yes [provider]  magnesium 30 MG tablet Take 250 mg by mouth daily.    Yes [provider]  meloxicam (MOBIC) 7.5 MG tablet Take 1 tablet (7.5 mg total) by mouth daily as needed for pain. Patient not taking: Reported on 10/24/2020 04/19/20   [name], MD   Social History   Socioeconomic History   Marital status: Married    Spouse name: Rikki Trosper   Number of children: 2   Years of education: Not on file   Highest education level: Not on file  Occupational History   Occupation: Retired Therapist, sports  Tobacco Use   Smoking status: Never   Smokeless tobacco: Never  Vaping Use   Vaping Use: Never used  Substance and Sexual Activity   Alcohol use: Yes    Alcohol/week: 1.0 - 2.0 standard drink    Types: 1 - 2 Standard drinks or equivalent per week   Drug use: No   Sexual activity: Yes  Other Topics Concern   Not on file  Social History Narrative   Married. Education: [employer].   Social Determinants of Health   Financial Resource Strain: Not on  file  Food Insecurity: Not on file  Transportation Needs: Not on file  Physical Activity: Not on file  Stress: Not on file  Social Connections: Not on file  Intimate Partner Violence: Not on file    Review of Systems  Constitutional:  Negative for fatigue and unexpected weight change.  Respiratory:  Negative for chest tightness and shortness of breath.   Cardiovascular:  Negative for chest pain, palpitations and leg swelling.  Gastrointestinal:  Negative for abdominal pain and blood in stool.  Neurological:  Positive for dizziness and light-headedness (as above.). Negative for syncope and headaches.    Objective:   Vitals:   10/24/20 0955  BP: 122/88  Pulse: 89  Temp: 97.6 F (36.4 C)  SpO2: 98%  Weight: 193 lb (87.5 kg)  Height: 5' 1.5" (1.562 m)     Physical Exam Vitals reviewed.  Constitutional:      Appearance: Normal appearance. She is well-developed.  HENT:     Head: Normocephalic and atraumatic.  Eyes:     Conjunctiva/sclera: Conjunctivae normal.     Pupils: Pupils are equal, round, and reactive to light.  Neck:     Vascular: No carotid bruit.  Cardiovascular:     Rate and Rhythm: Normal rate and regular rhythm.     Heart sounds: Normal heart sounds.  Pulmonary:     Effort: Pulmonary effort is normal.     Breath sounds: Normal breath sounds.  Abdominal:     Palpations: Abdomen is soft. There is no pulsatile mass.     Tenderness: There is no abdominal tenderness.  Musculoskeletal:     Right lower leg: No edema.     Left lower leg: No edema.  Skin:    General: Skin is warm and dry.  Neurological:     Mental Status: She is alert and oriented to person, place, and time.  Psychiatric:        Mood and Affect: Mood normal.        Behavior: Behavior normal.       Assessment & Plan:  Lori Mooney is a 75 y.o. female . Essential hypertension - Plan: lisinopril-hydrochlorothiazide (ZESTORETIC) 20-12.5 MG tablet, Comprehensive metabolic panel,  Vitamin D (25 hydroxy), Insulin, random, Comprehensive metabolic panel, Vitamin D (25 hydroxy), Insulin, random, CANCELED: Insulin, random -Stable in office.  Continue same regimen with lisinopril HCTZ, check labs.  If any low blood pressures, may need to decrease lisinopril to 10 mg or stop HCTZ component.  She will let me know.  Need for immunization against influenza - Plan: Flu Vaccine QUAD High Dose(Fluad),   Hypercholesterolemia - Plan: atorvastatin (LIPITOR) 20 MG tablet, Comprehensive metabolic panel, Vitamin D (25 hydroxy), Insulin, random, Comprehensive metabolic panel, Vitamin D (25 hydroxy), Insulin, random, CANCELED: Insulin, random  -Tolerating Lipitor, check labs, continue same dose  Vitamin D deficiency - Plan: Comprehensive metabolic panel, Vitamin D (25 hydroxy), Insulin, random, Comprehensive metabolic panel, Vitamin D (25 hydroxy), Insulin, random, CANCELED: Insulin, random  -Plan for start of higher dose vitamin D supplement per healthy weight and wellness.  Check updated labs as requested.  Prediabetes - Plan: Comprehensive metabolic panel, Vitamin D (25 hydroxy), Insulin, random, Comprehensive metabolic panel, Vitamin D (25 hydroxy), Insulin, random, CANCELED: Insulin, random  -Commended on weight loss, continue follow-up with weight management.  We will check insulin level as requested.  Considering biguanide or GLP 1.  Meds ordered this encounter  Medications   lisinopril-hydrochlorothiazide (ZESTORETIC) 20-12.5 MG tablet    Sig: Take 1 tablet by mouth daily.    Dispense:  90 tablet    Refill:  1   atorvastatin (LIPITOR) 20 MG tablet    Sig: Take 1 tablet (20 mg total) by mouth daily.    Dispense:  90 tablet    Refill:  1   Patient Instructions  I will add labs to what was drawn the other day. Other labs look ok. Keep up the good work.  If any further low blood pressures, let me know and we can adjust the dose of meds. Level looks ok today.  No med changes today.  Let me know if there are questions.  Follow up in 6 months. Call a week before that visit and I am happy to place order for  labs prior to that visit.      Signed,   Merri Ray, MD [place] 10/24/20 5:54 PM

## 2020-10-24 NOTE — Patient Instructions (Addendum)
I will add labs to what was drawn the other day. Other labs look ok. Keep up the good work.  If any further low blood pressures, let me know and we can adjust the dose of meds. Level looks ok today.  No med changes today. Let me know if there are questions.  Follow up in 6 months. Call a week before that visit and I am happy to place order for labs prior to that visit.

## 2020-10-25 LAB — INSULIN, RANDOM: Insulin: 30 u[IU]/mL — ABNORMAL HIGH

## 2020-11-14 ENCOUNTER — Ambulatory Visit (INDEPENDENT_AMBULATORY_CARE_PROVIDER_SITE_OTHER): Payer: Medicare Other | Admitting: Adult Health

## 2020-11-14 ENCOUNTER — Other Ambulatory Visit: Payer: Self-pay

## 2020-11-14 ENCOUNTER — Encounter (INDEPENDENT_AMBULATORY_CARE_PROVIDER_SITE_OTHER): Payer: Self-pay | Admitting: Adult Health

## 2020-11-14 VITALS — BP 122/80 | HR 72 | Temp 98.2°F | Ht 62.0 in | Wt 192.0 lb

## 2020-11-14 DIAGNOSIS — I10 Essential (primary) hypertension: Secondary | ICD-10-CM

## 2020-11-14 DIAGNOSIS — E559 Vitamin D deficiency, unspecified: Secondary | ICD-10-CM

## 2020-11-14 DIAGNOSIS — R7303 Prediabetes: Secondary | ICD-10-CM

## 2020-11-14 DIAGNOSIS — Z6837 Body mass index (BMI) 37.0-37.9, adult: Secondary | ICD-10-CM | POA: Diagnosis not present

## 2020-11-14 MED ORDER — METFORMIN HCL 500 MG PO TABS
ORAL_TABLET | ORAL | 0 refills | Status: DC
Start: 2020-11-14 — End: 2020-12-23

## 2020-11-14 NOTE — Progress Notes (Addendum)
Chief Complaint:   OBESITY Lori Mooney is here to discuss her progress with her obesity treatment plan along with follow-up of her obesity related diagnoses. Lori Mooney is on the Category 2 Plan and states she is following her eating plan approximately 70% of the time. Lori Mooney states she walked on vacation.  Today's visit was #: 14 Starting weight: 203 lbs Starting date: 01/30/2020 Today's weight: 192 lbs Today's date: 11/14/2020 Total lbs lost to date: 11 lbs Total lbs lost since last in-office visit: 0  Interim History: On 10/24/2020, Lori Mooney had a visit with her PCP - no changes to medications, closely watching BP for hypotension. Today, she denies dizziness or fatigue.  Subjective:   1. Prediabetes Weight loss continues to be stalled, despite eating on plan and walking daily. She will occasionally experience polyphagia. 10/24/20 CMP- GFR >60, Creat 0.91  2. Essential hypertension Per PCP-If any low blood pressures, may need to decrease lisinopril to 10 mg or stop HCTZ component.    3. Vitamin D deficiency She is on OTC vitamin D3 2,000 IU daily.  Assessment/Plan:   1. Prediabetes Start metformin 500 mg - 1/2 tablet with breakfast for 1 week, then increase to 1 tablet with breakfast - hold at this dose.  - Start metFORMIN (GLUCOPHAGE) 500 MG tablet; 1/2 tab with breakfast for one week, the increase to one full tablet with breakfast- hold at this dose/  Dispense: 30 tablet; Refill: 0  2. Essential hypertension Continue to monitor for symptoms of hypotension.  3. Vitamin D deficiency Continue OTC supplement.  4. Current BMI 35.2  Lori Mooney is currently in the action stage of change. As such, her goal is to continue with weight loss efforts. She has agreed to the Category 2 Plan.   Exercise goals:  As is.  Behavioral modification strategies: increasing lean protein intake, decreasing simple carbohydrates, meal planning and cooking strategies, keeping healthy foods in the home,  and planning for success.  Lori Mooney has agreed to follow-up with our clinic in 4 weeks. She was informed of the importance of frequent follow-up visits to maximize her success with intensive lifestyle modifications for her multiple health conditions.   Objective:   Blood pressure 122/80, pulse 72, temperature 98.2 F (36.8 C), height 5\' 2"  (1.575 m), weight 192 lb (87.1 kg), SpO2 98 %. Body mass index is 35.12 kg/m.  General: Cooperative, alert, well developed, in no acute distress. HEENT: Conjunctivae and lids unremarkable. Cardiovascular: Regular rhythm.  Lungs: Normal work of breathing. Neurologic: No focal deficits.   Lab Results  Component Value Date   CREATININE 0.91 10/24/2020   BUN 18 10/24/2020   NA 139 10/24/2020   K 4.5 10/24/2020   CL 102 10/24/2020   CO2 26 10/24/2020   Lab Results  Component Value Date   ALT 17 10/24/2020   AST 15 10/24/2020   ALKPHOS 67 10/24/2020   BILITOT 0.5 10/24/2020   Lab Results  Component Value Date   HGBA1C 5.9 10/22/2020   HGBA1C 5.8 (H) 07/15/2020   HGBA1C 6.1 (H) 04/15/2020   HGBA1C 6.0 (H) 01/30/2020   HGBA1C 6.0 (H) 11/20/2019   Lab Results  Component Value Date   INSULIN 23.6 07/15/2020   INSULIN 20.7 01/30/2020   Lab Results  Component Value Date   TSH 2.590 01/30/2020   Lab Results  Component Value Date   CHOL 181 10/22/2020   HDL 55.30 10/22/2020   LDLCALC 104 (H) 07/15/2020   LDLDIRECT 89.0 10/22/2020   TRIG 218.0 (H)  10/22/2020   CHOLHDL 3 10/22/2020   Lab Results  Component Value Date   VD25OH 45.66 10/24/2020   VD25OH 43.7 07/15/2020   VD25OH 40.8 01/30/2020   Lab Results  Component Value Date   WBC 6.0 10/22/2020   HGB 14.9 10/22/2020   HCT 45.0 10/22/2020   MCV 89.6 10/22/2020   PLT 273.0 10/22/2020   Obesity Behavioral Intervention:   Approximately 15 minutes were spent on the discussion below.  ASK: We discussed the diagnosis of obesity with Lori Mooney today and Lori Mooney agreed to give [place]  permission to discuss obesity behavioral modification therapy today.  ASSESS: Lori Mooney has the diagnosis of obesity and her BMI today is 35.2. Lori Mooney is in the action stage of change.   ADVISE: [name] was educated on the multiple health risks of obesity as well as the benefit of weight loss to improve her health. She was advised of the need for long term treatment and the importance of lifestyle modifications to improve her current health and to decrease her risk of future health problems.  AGREE: Multiple dietary modification options and treatment options were discussed and Lori Mooney agreed to follow the recommendations documented in the above note.  ARRANGE: Lori Mooney was educated on the importance of frequent visits to treat obesity as outlined per CMS and USPSTF guidelines and agreed to schedule her next follow up appointment today.  Attestation Statements:   Reviewed by clinician on day of visit: allergies, medications, problem list, medical history, surgical history, family history, social history, and previous encounter notes.  I, Lori Mooney, CMA, am acting as Location manager for Mina Marble, NP.  I have reviewed the above documentation for accuracy and completeness, and I agree with the above. -  Lori Mooney d. [name], NP-C

## 2020-11-20 ENCOUNTER — Other Ambulatory Visit: Payer: Self-pay | Admitting: Family Medicine

## 2020-11-20 DIAGNOSIS — I10 Essential (primary) hypertension: Secondary | ICD-10-CM

## 2020-11-20 DIAGNOSIS — E7800 Pure hypercholesterolemia, unspecified: Secondary | ICD-10-CM

## 2020-11-23 ENCOUNTER — Other Ambulatory Visit: Payer: Self-pay | Admitting: Family Medicine

## 2020-11-23 DIAGNOSIS — E7800 Pure hypercholesterolemia, unspecified: Secondary | ICD-10-CM

## 2020-11-26 DIAGNOSIS — Z23 Encounter for immunization: Secondary | ICD-10-CM | POA: Diagnosis not present

## 2020-12-23 ENCOUNTER — Other Ambulatory Visit: Payer: Self-pay

## 2020-12-23 ENCOUNTER — Encounter (INDEPENDENT_AMBULATORY_CARE_PROVIDER_SITE_OTHER): Payer: Self-pay | Admitting: Adult Health

## 2020-12-23 ENCOUNTER — Ambulatory Visit (INDEPENDENT_AMBULATORY_CARE_PROVIDER_SITE_OTHER): Payer: Medicare Other | Admitting: Adult Health

## 2020-12-23 VITALS — BP 110/71 | HR 80 | Temp 97.9°F | Ht 62.0 in | Wt 189.0 lb

## 2020-12-23 DIAGNOSIS — Z6837 Body mass index (BMI) 37.0-37.9, adult: Secondary | ICD-10-CM | POA: Diagnosis not present

## 2020-12-23 DIAGNOSIS — R7303 Prediabetes: Secondary | ICD-10-CM

## 2020-12-23 MED ORDER — METFORMIN HCL 500 MG PO TABS: ORAL_TABLET | ORAL | 1 refills | Status: DC

## 2020-12-23 MED ORDER — METFORMIN HCL 500 MG PO TABS: ORAL_TABLET | ORAL | 0 refills | Status: DC

## 2020-12-23 NOTE — Progress Notes (Signed)
Chief Complaint:   OBESITY Lori Mooney is here to discuss her progress with her obesity treatment plan along with follow-up of her obesity related diagnoses. Lori Mooney is on the Category 2 Plan and states she is following her eating plan approximately 80% of the time. Lori Mooney states she is doing aerobics for 60 minutes 2 times per week.  Today's visit was #: 15 Starting weight: 203 lbs Starting date: 01/30/2020 Today's weight: 189 lbs Today's date: 12/23/2020 Total lbs lost to date: 14 lbs Total lbs lost since last in-office visit: 3 lbs  Interim History:  Lori Mooney was started on metformin at last office visit - has titrated up to full 500mg  tablet with breakfast. She denies GI upset since starting the biguanide. She is still experiencing late afternoon polyphagia.  Eating out frequently while home in [place] - 5 days up [place] to celebrate her father's 98th birthday.  Subjective:   1. Prediabetes On 10/24/2020, CMP showed GFR >60, creatinine 0.91. Lori Mooney was started on metformin at last office visit - has titrated up to full 500mg  tablet with breakfast. She denies GI upset since starting the biguanide. She is still experiencing late afternoon polyphagia.  Assessment/Plan:   1. Prediabetes Check labs at next office visit. Refill and increase metformin 500 mg 1 tablet at breakfast, 1/2 tablet at lunch, as per below.  - Refill metFORMIN (GLUCOPHAGE) 500 MG tablet; 1 full tab at breakfast and 1/2 tab at lunch  Dispense: 60 tablet; Refill: 1  2. Current BMI 34.6  Lori Mooney is currently in the action stage of change. As such, her goal is to continue with weight loss efforts. She has agreed to the Category 2 Plan.   Check fasting labs at next office visit.  Exercise goals:  As is.  Behavioral modification strategies: increasing lean protein intake, decreasing simple carbohydrates, meal planning and cooking strategies, keeping healthy foods in the home, and planning for success.  Lori Mooney has  agreed to follow-up with our clinic in 4 weeks, fasting. She was informed of the importance of frequent follow-up visits to maximize her success with intensive lifestyle modifications for her multiple health conditions.   Objective:   Blood pressure 110/71, pulse 80, temperature 97.9 F (36.6 C), height 5\' 2"  (1.575 m), weight 189 lb (85.7 kg), SpO2 97 %. Body mass index is 34.57 kg/m.  General: Cooperative, alert, well developed, in no acute distress. HEENT: Conjunctivae and lids unremarkable. Cardiovascular: Regular rhythm.  Lungs: Normal work of breathing. Neurologic: No focal deficits.   Lab Results  Component Value Date   CREATININE 0.91 10/24/2020   BUN 18 10/24/2020   NA 139 10/24/2020   K 4.5 10/24/2020   CL 102 10/24/2020   CO2 26 10/24/2020   Lab Results  Component Value Date   ALT 17 10/24/2020   AST 15 10/24/2020   ALKPHOS 67 10/24/2020   BILITOT 0.5 10/24/2020   Lab Results  Component Value Date   HGBA1C 5.9 10/22/2020   HGBA1C 5.8 (H) 07/15/2020   HGBA1C 6.1 (H) 04/15/2020   HGBA1C 6.0 (H) 01/30/2020   HGBA1C 6.0 (H) 11/20/2019   Lab Results  Component Value Date   INSULIN 23.6 07/15/2020   INSULIN 20.7 01/30/2020   Lab Results  Component Value Date   TSH 2.590 01/30/2020   Lab Results  Component Value Date   CHOL 181 10/22/2020   HDL 55.30 10/22/2020   LDLCALC 104 (H) 07/15/2020   LDLDIRECT 89.0 10/22/2020   TRIG 218.0 (H) 10/22/2020  CHOLHDL 3 10/22/2020   Lab Results  Component Value Date   VD25OH 45.66 10/24/2020   VD25OH 43.7 07/15/2020   VD25OH 40.8 01/30/2020   Lab Results  Component Value Date   WBC 6.0 10/22/2020   HGB 14.9 10/22/2020   HCT 45.0 10/22/2020   MCV 89.6 10/22/2020   PLT 273.0 10/22/2020   Obesity Behavioral Intervention:   Approximately 15 minutes were spent on the discussion below.  ASK: We discussed the diagnosis of obesity with Lori Mooney today and Devinn agreed to give [place] permission to discuss obesity  behavioral modification therapy today.  ASSESS: Preciosa has the diagnosis of obesity and her BMI today is 34.6. [place] is in the action stage of change.   ADVISE: Rease was educated on the multiple health risks of obesity as well as the benefit of weight loss to improve her health. She was advised of the need for long term treatment and the importance of lifestyle modifications to improve her current health and to decrease her risk of future health problems.  AGREE: Multiple dietary modification options and treatment options were discussed and Keila agreed to follow the recommendations documented in the above note.  ARRANGE: Tamea was educated on the importance of frequent visits to treat obesity as outlined per CMS and USPSTF guidelines and agreed to schedule her next follow up appointment today.  Attestation Statements:   Reviewed by clinician on day of visit: allergies, medications, problem list, medical history, surgical history, family history, social history, and previous encounter notes.  I, Water quality scientist, CMA, am acting as Location manager for Mina Marble, NP.  I have reviewed the above documentation for accuracy and completeness, and I agree with the above. -  Jacquie Lukes d. Jaclene Bartelt, NP-C

## 2021-01-22 ENCOUNTER — Telehealth: Payer: Self-pay | Admitting: Family Medicine

## 2021-01-22 NOTE — Telephone Encounter (Signed)
Left message for patient to call back and schedule Medicare Annual Wellness Visit (AWV) in office.   If not able to come in office, please offer to do virtually or by telephone.  Left office number and my jabber [phone].  Last AWV:01/11/2020  Please schedule at anytime with Nurse Health Advisor.

## 2021-01-26 HISTORY — PX: BREAST BIOPSY: SHX20

## 2021-01-28 ENCOUNTER — Ambulatory Visit (INDEPENDENT_AMBULATORY_CARE_PROVIDER_SITE_OTHER): Payer: Medicare Other | Admitting: Adult Health

## 2021-01-28 ENCOUNTER — Encounter (INDEPENDENT_AMBULATORY_CARE_PROVIDER_SITE_OTHER): Payer: Self-pay | Admitting: Adult Health

## 2021-01-28 ENCOUNTER — Other Ambulatory Visit: Payer: Self-pay

## 2021-01-28 VITALS — BP 104/70 | HR 89 | Temp 97.9°F | Ht 62.0 in | Wt 191.0 lb

## 2021-01-28 DIAGNOSIS — R7303 Prediabetes: Secondary | ICD-10-CM | POA: Diagnosis not present

## 2021-01-28 DIAGNOSIS — E66812 Obesity, class 2: Secondary | ICD-10-CM

## 2021-01-28 DIAGNOSIS — E559 Vitamin D deficiency, unspecified: Secondary | ICD-10-CM

## 2021-01-28 DIAGNOSIS — I10 Essential (primary) hypertension: Secondary | ICD-10-CM

## 2021-01-28 DIAGNOSIS — Z6837 Body mass index (BMI) 37.0-37.9, adult: Secondary | ICD-10-CM

## 2021-01-28 NOTE — Progress Notes (Signed)
Chief Complaint:   OBESITY Lori Mooney is here to discuss her progress with her obesity treatment plan along with follow-up of her obesity related diagnoses. Lori Mooney is on the Category 2 Plan and states she is following her eating plan approximately 60% of the time. Lori Mooney states she was walking on her vacation.  Today's visit was #: 66 Starting weight: 203 lbs Starting date: 01/30/2020 Today's weight: 191 lbs Today's date: 01/28/2021 Total lbs lost to date: 12 lbs Total lbs lost since last in-office visit: 0  Interim History:  On 11/14/2020, she was started on metformin due to polyphagia and weight loss plateau.  She is currently on metformin 500 mg at breakfast and at least OV, 1/2 500mg  tab added in at lunch due to afternoon hunger. Since increase she reports decrease in overall polyphagia.  She and her [place] travelled to [place] to celebrate the Christmas holiday.  Subjective:   1. Prediabetes On 11/14/2020, she was started on metformin due to polyphagia and weight loss plateau.  She is currently on metformin 500 mg at breakfast and at least OV, 1/2 500mg  tab added in at lunch due to afternoon hunger. Since increase she reports decrease in overall polyphagia.  2. Essential hypertension BP today - 104/70, HR 89. She is on lisinopril/HCTZ 20/12.5 daily. She denies symptoms of hypotension.  3. Vitamin D deficiency She is on OTC vitamin D3 1,000 IU daily.  Assessment/Plan:   1. Prediabetes Check labs today. MyChart patient with results and instructions about Metformin Rx tomorrow.  - Insulin, random - Hemoglobin A1c  2. Essential hypertension Check labs. Check BP/HR - bring log to follow-up. If BP remains low at next office visit - consider decreasing antihypertensive therapy.  - Comprehensive metabolic panel  3. Vitamin D deficiency Will check vitamin D level today.  - VITAMIN D 25 Hydroxy (Vit-D Deficiency, Fractures)  4. Current BMI 35.0  Lori Mooney is  currently in the action stage of change. As such, her goal is to continue with weight loss efforts. She has agreed to the Category 2 Plan.   Exercise goals:  As is.  Behavioral modification strategies: increasing lean protein intake, decreasing simple carbohydrates, meal planning and cooking strategies, keeping healthy foods in the home, and planning for success.  Lori Mooney has agreed to follow-up with our clinic in 4 weeks. She was informed of the importance of frequent follow-up visits to maximize her success with intensive lifestyle modifications for her multiple health conditions.   Objective:   Blood pressure 104/70, pulse 89, temperature 97.9 F (36.6 C), height 5\' 2"  (1.575 m), weight 191 lb (86.6 kg), SpO2 97 %. Body mass index is 34.93 kg/m.  General: Cooperative, alert, well developed, in no acute distress. HEENT: Conjunctivae and lids unremarkable. Cardiovascular: Regular rhythm.  Lungs: Normal work of breathing. Neurologic: No focal deficits.   Lab Results  Component Value Date   CREATININE 0.91 10/24/2020   BUN 18 10/24/2020   NA 139 10/24/2020   K 4.5 10/24/2020   CL 102 10/24/2020   CO2 26 10/24/2020   Lab Results  Component Value Date   ALT 17 10/24/2020   AST 15 10/24/2020   ALKPHOS 67 10/24/2020   BILITOT 0.5 10/24/2020   Lab Results  Component Value Date   HGBA1C 5.9 10/22/2020   HGBA1C 5.8 (H) 07/15/2020   HGBA1C 6.1 (H) 04/15/2020   HGBA1C 6.0 (H) 01/30/2020   HGBA1C 6.0 (H) 11/20/2019   Lab Results  Component Value Date   INSULIN  23.6 07/15/2020   INSULIN 20.7 01/30/2020   Lab Results  Component Value Date   TSH 2.590 01/30/2020   Lab Results  Component Value Date   CHOL 181 10/22/2020   HDL 55.30 10/22/2020   LDLCALC 104 (H) 07/15/2020   LDLDIRECT 89.0 10/22/2020   TRIG 218.0 (H) 10/22/2020   CHOLHDL 3 10/22/2020   Lab Results  Component Value Date   VD25OH 45.66 10/24/2020   VD25OH 43.7 07/15/2020   VD25OH 40.8 01/30/2020   Lab  Results  Component Value Date   WBC 6.0 10/22/2020   HGB 14.9 10/22/2020   HCT 45.0 10/22/2020   MCV 89.6 10/22/2020   PLT 273.0 10/22/2020   Obesity Behavioral Intervention:   Approximately 15 minutes were spent on the discussion below.  ASK: We discussed the diagnosis of obesity with Lori Mooney today and Lori Mooney agreed to give [place] permission to discuss obesity behavioral modification therapy today.  ASSESS: Lori Mooney has the diagnosis of obesity and her BMI today is 35.0. Lori Mooney is in the action stage of change.   ADVISE: Lori Mooney was educated on the multiple health risks of obesity as well as the benefit of weight loss to improve her health. She was advised of the need for long term treatment and the importance of lifestyle modifications to improve her current health and to decrease her risk of future health problems.  AGREE: Multiple dietary modification options and treatment options were discussed and Lori Mooney agreed to follow the recommendations documented in the above note.  ARRANGE: Lori Mooney was educated on the importance of frequent visits to treat obesity as outlined per CMS and USPSTF guidelines and agreed to schedule her next follow up appointment today.  Attestation Statements:   Reviewed by clinician on day of visit: allergies, medications, problem list, medical history, surgical history, family history, social history, and previous encounter notes.  I, Water quality scientist, CMA, am acting as Location manager for Mina Marble, NP.  I have reviewed the above documentation for accuracy and completeness, and I agree with the above. -  Lesslie Mossa d. Geni Skorupski, NP-C

## 2021-01-29 ENCOUNTER — Other Ambulatory Visit (INDEPENDENT_AMBULATORY_CARE_PROVIDER_SITE_OTHER): Payer: Self-pay | Admitting: Adult Health

## 2021-01-29 ENCOUNTER — Encounter (INDEPENDENT_AMBULATORY_CARE_PROVIDER_SITE_OTHER): Payer: Self-pay | Admitting: Adult Health

## 2021-01-29 DIAGNOSIS — R7303 Prediabetes: Secondary | ICD-10-CM

## 2021-01-29 LAB — COMPREHENSIVE METABOLIC PANEL
ALT: 16 IU/L (ref 0–32)
AST: 18 IU/L (ref 0–40)
Albumin/Globulin Ratio: 2.4 — ABNORMAL HIGH (ref 1.2–2.2)
Albumin: 4.7 g/dL (ref 3.7–4.7)
Alkaline Phosphatase: 78 IU/L (ref 44–121)
BUN/Creatinine Ratio: 19 (ref 12–28)
BUN: 18 mg/dL (ref 8–27)
Bilirubin Total: 0.3 mg/dL (ref 0.0–1.2)
CO2: 22 mmol/L (ref 20–29)
Calcium: 10 mg/dL (ref 8.7–10.3)
Chloride: 105 mmol/L (ref 96–106)
Creatinine, Ser: 0.97 mg/dL (ref 0.57–1.00)
Globulin, Total: 2 g/dL (ref 1.5–4.5)
Glucose: 96 mg/dL (ref 70–99)
Potassium: 4.7 mmol/L (ref 3.5–5.2)
Sodium: 142 mmol/L (ref 134–144)
Total Protein: 6.7 g/dL (ref 6.0–8.5)
eGFR: 61 mL/min/{1.73_m2} (ref >59)

## 2021-01-29 LAB — VITAMIN D 25 HYDROXY (VIT D DEFICIENCY, FRACTURES): Vit D, 25-Hydroxy: 52 ng/mL (ref 30.0–100.0)

## 2021-01-29 LAB — HEMOGLOBIN A1C
Est. average glucose Bld gHb Est-mCnc: 117 mg/dL
Hgb A1c MFr Bld: 5.7 % — ABNORMAL HIGH (ref 4.8–5.6)

## 2021-01-29 LAB — INSULIN, RANDOM: INSULIN: 26.6 u[IU]/mL — ABNORMAL HIGH (ref 2.6–24.9)

## 2021-01-29 MED ORDER — METFORMIN HCL 500 MG PO TABS: ORAL_TABLET | ORAL | 1 refills | Status: DC

## 2021-01-29 NOTE — Telephone Encounter (Signed)
[email or web address]

## 2021-02-06 ENCOUNTER — Ambulatory Visit (INDEPENDENT_AMBULATORY_CARE_PROVIDER_SITE_OTHER): Payer: Medicare Other

## 2021-02-06 DIAGNOSIS — Z780 Asymptomatic menopausal state: Secondary | ICD-10-CM

## 2021-02-06 DIAGNOSIS — Z0000 Encounter for general adult medical examination without abnormal findings: Secondary | ICD-10-CM | POA: Diagnosis not present

## 2021-02-06 NOTE — Progress Notes (Signed)
Subjective:   Lori Mooney is a 76 y.o. female who presents for Medicare Annual (Subsequent) preventive examination.  I connected with Lavada Mesi today by telephone and verified that I am speaking with the correct person using two identifiers. Location patient: home Location provider: work Persons participating in the virtual visit: patient, provider.   I discussed the limitations, risks, security and privacy concerns of performing an evaluation and management service by telephone and the availability of in person appointments. I also discussed with the patient that there may be a patient responsible charge related to this service. The patient expressed understanding and verbally consented to this telephonic visit.    Interactive audio and video telecommunications were attempted between this provider and patient, however failed, due to patient having technical difficulties OR patient did not have access to video capability.  We continued and completed visit with audio only.    Review of Systems     Cardiac Risk Factors include: advanced age (>24men, >60 women);dyslipidemia     Objective:    Today's Vitals   There is no height or weight on file to calculate BMI.  Advanced Directives 02/06/2021 01/11/2020 08/24/2018 08/23/2018 10/19/2017 07/20/2017 08/23/2012  Does Patient Have a Medical Advance Directive? Yes Yes Yes Yes Yes Yes Patient has advance directive, copy not in chart  Type of Advance Directive [place];Living will Living will [place];Living will [place];Living will - Healthcare Power of [place];Living will  Does patient want to make changes to medical advance directive? - - No - Patient declined - - No - Patient declined -  Copy of [place] in Chart? No - copy requested - - - - No - copy requested -    Current Medications (verified) Outpatient Encounter  Medications as of 02/06/2021  Medication Sig   acetaminophen (TYLENOL) 325 MG tablet Take 650 mg by mouth every 6 (six) hours as needed.   atorvastatin (LIPITOR) 20 MG tablet Take 1 tablet by mouth once daily   Biotin w/ Vitamins C & E (HAIR/SKIN/NAILS PO) Take by mouth. Natures Bounty   Cholecalciferol (VITAMIN D-3) 1000 UNITS CAPS Take 1,000 Units by mouth daily.   famotidine (PEPCID) 10 MG tablet Take 10 mg by mouth at bedtime.    lisinopril-hydrochlorothiazide (ZESTORETIC) 20-12.5 MG tablet Take 1 tablet by mouth once daily   loratadine (CLARITIN) 10 MG tablet Take 10 mg by mouth daily as needed for allergies.    magnesium 30 MG tablet Take 250 mg by mouth daily.    meloxicam (MOBIC) 7.5 MG tablet Take 1 tablet (7.5 mg total) by mouth daily as needed for pain.   metFORMIN (GLUCOPHAGE) 500 MG tablet 1 full tab at breakfast and 1/2 tab at lunch   No facility-administered encounter medications on file as of 02/06/2021.    Allergies (verified) Shellfish allergy and Strawberry extract   History: Past Medical History:  Diagnosis Date   Allergy    Arthritis    history of arthritis in the fingers   Cataract    Constipation    DDD (degenerative disc disease), lumbar    Exogenous obesity    GERD (gastroesophageal reflux disease)    Heartburn    Hypercholesterolemia    Hyperlipidemia    hypercholesterolemia   Hypertension    Hypertriglyceridemia    Osteoarthritis    Palpitations    Prediabetes    SOB (shortness of breath) on exertion    Swelling  of both lower extremities    Past Surgical History:  Procedure Laterality Date   CATARACT EXTRACTION W/ INTRAOCULAR LENS IMPLANT Left    DILITATION & CURRETTAGE/HYSTROSCOPY WITH VERSAPOINT RESECTION N/A 08/25/2012   Procedure: DILATATION & CURETTAGE/HYSTEROSCOPY WITH VERSAPOINT RESECTION;  Surgeon: Princess Bruins, MD;  Location: [facility];  Service: Gynecology;  Laterality: N/A;   EYE SURGERY     LUMBAR EPIDURAL INJECTION  08/04/2017    TONSILLECTOMY     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   Procedure: TOTAL KNEE ARTHROPLASTY;  Surgeon: [name], MD;  Location: [facility];  Service: Orthopedics;  Laterality: Left;  176min   TUBAL LIGATION     Family History  Problem Relation Age of Onset   Hyperlipidemia Mother    Heart disease Mother    Hypertension Mother    Hypertension Father    Hyperlipidemia Father    Thyroid disease Father    Hyperlipidemia Sister    Hypertension Sister    Social History   Socioeconomic History   Marital status: Married    Spouse name: Syrai Gladwin   Number of children: 2   Years of education: Not on file   Highest education level: Not on file  Occupational History   Occupation: Retired Therapist, sports  Tobacco Use   Smoking status: Never   Smokeless tobacco: Never  Vaping Use   Vaping Use: Never used  Substance and Sexual Activity   Alcohol use: Yes    Alcohol/week: 1.0 - 2.0 standard drink    Types: 1 - 2 Standard drinks or equivalent per week   Drug use: No   Sexual activity: Yes  Other Topics Concern   Not on file  Social History Narrative   Married. Education: [employer].   Social Determinants of Health   Financial Resource Strain: Low Risk    Difficulty of Paying Living Expenses: Not hard at all  Food Insecurity: No Food Insecurity   Worried About Charity fundraiser in the Last Year: Never true   [place] in the Last Year: Never true  Transportation Needs: No Transportation Needs   Lack of Transportation (Medical): No   Lack of Transportation (Non-Medical): No  Physical Activity: Sufficiently Active   Days of Exercise per Week: 3 days   Minutes of Exercise per Session: 60 min  Stress: No Stress Concern Present   Feeling of Stress : Not at all  Social Connections: Moderately Integrated   Frequency of Communication with Friends and Family: Twice a week   Frequency of Social Gatherings with Friends and Family: Twice a week   Attends Religious Services: 1 to 4 times  per year   Active Member of [employer] or Organizations: No   Attends Music therapist: Never   Marital Status: Married    Tobacco Counseling Counseling given: Not Answered   Clinical Intake:  Pre-visit preparation completed: Yes  Pain : No/denies pain     Nutritional Risks: None Diabetes: No  How often do you need to have someone help you when you read instructions, pamphlets, or other written materials from your doctor or pharmacy?: 1 - Never What is the last grade level you completed in school?: BS  Diabetic?no   Interpreter Needed?: No  Information entered by :: [place] of Daily Living In your present state of health, do you have any difficulty performing the following activities: 02/06/2021  Hearing? N  Vision? N  Difficulty concentrating or making decisions? N  Walking or climbing stairs?  N  Dressing or bathing? N  Doing errands, shopping? N  Preparing Food and eating ? N  Using the Toilet? N  In the past six months, have you accidently leaked urine? N  Do you have problems with loss of bowel control? N  Managing your Medications? N  Managing your Finances? N  Housekeeping or managing your Housekeeping? N  Some recent data might be hidden    Patient Care Team: [name], MD as PCP - General (Family Medicine) Rutherford Guys, MD as Consulting Physician (Ophthalmology) Jerline Pain, MD as Consulting Physician (Cardiology) Ronald Lobo, MD as Consulting Physician (Gastroenterology) Particia Nearing, MD as Attending Physician (Dermatology)  Indicate any recent Medical Services you may have received from other than [facility] providers in the past year (date may be approximate).     Assessment:   This is a routine wellness examination for Kelty.  Hearing/Vision screen Vision Screening - Comments:: Annual eye exams wear glasses  Dietary issues and exercise activities discussed: Current Exercise Habits: Home exercise  routine, Type of exercise: walking, Time (Minutes): 30, Frequency (Times/Week): 3, Weekly Exercise (Minutes/Week): 90, Intensity: Mild, Exercise limited by: None identified   Goals Addressed   None    Depression Screen PHQ 2/9 Scores 02/06/2021 02/06/2021 02/06/2021 10/24/2020 04/19/2020 01/30/2020 01/11/2020  PHQ - 2 Score 0 0 0 0 0 2 0  PHQ- 9 Score - - - - - 5 -    Fall Risk Fall Risk  02/06/2021 04/19/2020 01/11/2020 11/24/2019 05/26/2019  Falls in the past year? 0 0 0 0 0  Number falls in past yr: 0 - 0 - 0  Injury with Fall? 0 - 0 - 0  Follow up Falls evaluation completed Falls evaluation completed Falls evaluation completed;Education provided Falls evaluation completed Falls evaluation completed    [place]:  Any stairs in or around the home? Yes  If so, are there any without handrails? No  Home free of loose throw rugs in walkways, pet beds, electrical cords, etc? Yes  Adequate lighting in your home to reduce risk of falls? Yes   ASSISTIVE DEVICES UTILIZED TO PREVENT FALLS:  Life alert? No  Use of a cane, walker or w/c? No  Grab bars in the bathroom? Yes  Shower chair or bench in shower? Yes  Elevated toilet seat or a handicapped toilet? Yes     Cognitive Function:    Normal cognitive status assessed by direct observation by this Nurse Health Advisor. No abnormalities found.   6CIT Screen 01/11/2020 10/19/2017  What Year? 0 points 0 points  What month? 0 points 0 points  What time? 0 points 0 points  Count back from 20 0 points 0 points  Months in reverse 2 points 0 points  Repeat phrase 0 points 0 points  Total Score 2 0    Immunizations Immunization History  Administered Date(s) Administered   Fluad Quad(high Dose 65+) 10/24/2020   Influenza Nasal 10/16/2017   Influenza Whole 11/07/2011   Influenza,inj,Quad PF,6+ Mos 10/28/2012   Influenza-Unspecified 10/26/2013, 10/15/2014, 11/10/2015, 10/19/2016, 11/05/2019   PFIZER(Purple  Top)SARS-COV-2 Vaccination 03/04/2019, 03/29/2019, 12/09/2019, 11/26/2020   Pneumococcal Conjugate-13 10/30/2014   Pneumococcal Polysaccharide-23 07/20/2016   Td 03/20/2009   Tdap 11/24/2019    TDAP status: Up to date  Flu Vaccine status: Up to date  Pneumococcal vaccine status: Up to date  Covid-19 vaccine status: Completed vaccines  Qualifies for Shingles Vaccine? Yes   Zostavax completed No   Shingrix Completed?:  No.    Education has been provided regarding the importance of this vaccine. Patient has been advised to call insurance company to determine out of pocket expense if they have not yet received this vaccine. Advised may also receive vaccine at local pharmacy or Health Dept. Verbalized acceptance and understanding.  Screening Tests Health Maintenance  Topic Date Due   Zoster Vaccines- Shingrix (1 of 2) Never done   COVID-19 Vaccine (5 - Booster for Pfizer series) 01/21/2021   COLONOSCOPY (Pts 45-33yrs Insurance coverage will need to be confirmed)  06/09/2021   TETANUS/TDAP  11/23/2029   Pneumonia Vaccine 15+ Years old  Completed   INFLUENZA VACCINE  Completed   DEXA SCAN  Completed   Hepatitis C Screening  Completed   HPV VACCINES  Aged Out    Health Maintenance  Health Maintenance Due  Topic Date Due   Zoster Vaccines- Shingrix (1 of 2) Never done   COVID-19 Vaccine (5 - Booster for Pfizer series) 01/21/2021     Colorectal cancer screening: Type of screening: Colonoscopy. Completed 06/10/2011. Repeat every 10 years  Mammogram status: Completed 09/09/2020. Repeat every year  Bone Density status: Ordered 02/06/2021. Pt provided with contact info and advised to call to schedule appt.  Lung Cancer Screening: (Low Dose CT Chest recommended if Age 39-80 years, 30 pack-year currently smoking OR have quit w/in 15years.) does not qualify.   Lung Cancer Screening Referral: n/a  Additional Screening:  Hepatitis C Screening: does not qualify; Completed  06/20/2015  Vision Screening: Recommended annual ophthalmology exams for early detection of glaucoma and other disorders of the eye. Is the patient up to date with their annual eye exam?  Yes  Who is the provider or what is the name of the office in which the patient attends annual eye exams? Dr.Shipiro  If pt is not established with a provider, would they like to be referred to a provider to establish care? No .   Dental Screening: Recommended annual dental exams for proper oral hygiene  Community Resource Referral / Chronic Care Management: CRR required this visit?  No   CCM required this visit?  No      Plan:     I have personally reviewed and noted the following in the patients chart:   Medical and social history Use of alcohol, tobacco or illicit drugs  Current medications and supplements including opioid prescriptions.  Functional ability and status Nutritional status Physical activity Advanced directives List of other physicians Hospitalizations, surgeries, and ER visits in previous 12 months Vitals Screenings to include cognitive, depression, and falls Referrals and appointments  In addition, I have reviewed and discussed with patient certain preventive protocols, quality metrics, and best practice recommendations. A written personalized care plan for preventive services as well as general preventive health recommendations were provided to patient.     Randel Pigg, LPN   [id number]   Nurse Notes: none

## 2021-02-06 NOTE — Patient Instructions (Signed)
Ms. Lori Mooney , Thank you for taking time to come for your Medicare Wellness Visit. I appreciate your ongoing commitment to your health goals. Please review the following plan we discussed and let me know if I can assist you in the future.   Screening recommendations/referrals: Colonoscopy: 06/10/2011  per patient 10/2020  by Dr. Ival Bible will have report sent over  Mammogram: 08/30/2020 Bone Density: referral 02/06/2021 Recommended yearly ophthalmology/optometry visit for glaucoma screening and checkup Recommended yearly dental visit for hygiene and checkup  Vaccinations: Influenza vaccine: completed  Pneumococcal vaccine: completed  Tdap vaccine: 11/24/2019 Shingles vaccine: will consider     Advanced directives: yes   Conditions/risks identified: none  Next appointment: 05/08/2021  0840am  Dr.Greene    Preventive Care 24 Years and Older, Female Preventive care refers to lifestyle choices and visits with your health care provider that can promote health and wellness. What does preventive care include? A yearly physical exam. This is also called an annual well check. Dental exams once or twice a year. Routine eye exams. Ask your health care provider how often you should have your eyes checked. Personal lifestyle choices, including: Daily care of your teeth and gums. Regular physical activity. Eating a healthy diet. Avoiding tobacco and drug use. Limiting alcohol use. Practicing safe sex. Taking low-dose aspirin every day. Taking vitamin and mineral supplements as recommended by your health care provider. What happens during an annual well check? The services and screenings done by your health care provider during your annual well check will depend on your age, overall health, lifestyle risk factors, and family history of disease. Counseling  Your health care provider may ask you questions about your: Alcohol use. Tobacco use. Drug use. Emotional well-being. Home and  relationship well-being. Sexual activity. Eating habits. History of falls. Memory and ability to understand (cognition). Work and work Statistician. Reproductive health. Screening  You may have the following tests or measurements: Height, weight, and BMI. Blood pressure. Lipid and cholesterol levels. These may be checked every 5 years, or more frequently if you are over 77 years old. Skin check. Lung cancer screening. You may have this screening every year starting at age 31 if you have a 30-pack-year history of smoking and currently smoke or have quit within the past 15 years. Fecal occult blood test (FOBT) of the stool. You may have this test every year starting at age 44. Flexible sigmoidoscopy or colonoscopy. You may have a sigmoidoscopy every 5 years or a colonoscopy every 10 years starting at age 61. Hepatitis C blood test. Hepatitis B blood test. Sexually transmitted disease (STD) testing. Diabetes screening. This is done by checking your blood sugar (glucose) after you have not eaten for a while (fasting). You may have this done every 1-3 years. Bone density scan. This is done to screen for osteoporosis. You may have this done starting at age 22. Mammogram. This may be done every 1-2 years. Talk to your health care provider about how often you should have regular mammograms. Talk with your health care provider about your test results, treatment options, and if necessary, the need for more tests. Vaccines  Your health care provider may recommend certain vaccines, such as: Influenza vaccine. This is recommended every year. Tetanus, diphtheria, and acellular pertussis (Tdap, Td) vaccine. You may need a Td booster every 10 years. Zoster vaccine. You may need this after age 66. Pneumococcal 13-valent conjugate (PCV13) vaccine. One dose is recommended after age 48. Pneumococcal polysaccharide (PPSV23) vaccine. One dose is recommended after  age 44. Talk to your health care provider  about which screenings and vaccines you need and how often you need them. This information is not intended to replace advice given to you by your health care provider. Make sure you discuss any questions you have with your health care provider. Document Released: 02/08/2015 Document Revised: 10/02/2015 Document Reviewed: 11/13/2014 Elsevier Interactive Patient Education  2017 [place] Prevention in the Home Falls can cause injuries. They can happen to people of all ages. There are many things you can do to make your home safe and to help prevent falls. What can I do on the outside of my home? Regularly fix the edges of walkways and driveways and fix any cracks. Remove anything that might make you trip as you walk through a door, such as a raised step or threshold. Trim any bushes or trees on the path to your home. Use bright outdoor lighting. Clear any walking paths of anything that might make someone trip, such as rocks or tools. Regularly check to see if handrails are loose or broken. Make sure that both sides of any steps have handrails. Any raised decks and porches should have guardrails on the edges. Have any leaves, snow, or ice cleared regularly. Use sand or salt on walking paths during winter. Clean up any spills in your garage right away. This includes oil or grease spills. What can I do in the bathroom? Use night lights. Install grab bars by the toilet and in the tub and shower. Do not use towel bars as grab bars. Use non-skid mats or decals in the tub or shower. If you need to sit down in the shower, use a plastic, non-slip stool. Keep the floor dry. Clean up any water that spills on the floor as soon as it happens. Remove soap buildup in the tub or shower regularly. Attach bath mats securely with double-sided non-slip rug tape. Do not have throw rugs and other things on the floor that can make you trip. What can I do in the bedroom? Use night lights. Make sure  that you have a light by your bed that is easy to reach. Do not use any sheets or blankets that are too big for your bed. They should not hang down onto the floor. Have a firm chair that has side arms. You can use this for support while you get dressed. Do not have throw rugs and other things on the floor that can make you trip. What can I do in the kitchen? Clean up any spills right away. Avoid walking on wet floors. Keep items that you use a lot in easy-to-reach places. If you need to reach something above you, use a strong step stool that has a grab bar. Keep electrical cords out of the way. Do not use floor polish or wax that makes floors slippery. If you must use wax, use non-skid floor wax. Do not have throw rugs and other things on the floor that can make you trip. What can I do with my stairs? Do not leave any items on the stairs. Make sure that there are handrails on both sides of the stairs and use them. Fix handrails that are broken or loose. Make sure that handrails are as long as the stairways. Check any carpeting to make sure that it is firmly attached to the stairs. Fix any carpet that is loose or worn. Avoid having throw rugs at the top or bottom of the stairs. If you do  have throw rugs, attach them to the floor with carpet tape. Make sure that you have a light switch at the top of the stairs and the bottom of the stairs. If you do not have them, ask someone to add them for you. What else can I do to help prevent falls? Wear shoes that: Do not have high heels. Have rubber bottoms. Are comfortable and fit you well. Are closed at the toe. Do not wear sandals. If you use a stepladder: Make sure that it is fully opened. Do not climb a closed stepladder. Make sure that both sides of the stepladder are locked into place. Ask someone to hold it for you, if possible. Clearly mark and make sure that you can see: Any grab bars or handrails. First and last steps. Where the edge of  each step is. Use tools that help you move around (mobility aids) if they are needed. These include: Canes. Walkers. Scooters. Crutches. Turn on the lights when you go into a dark area. Replace any light bulbs as soon as they burn out. Set up your furniture so you have a clear path. Avoid moving your furniture around. If any of your floors are uneven, fix them. If there are any pets around you, be aware of where they are. Review your medicines with your doctor. Some medicines can make you feel dizzy. This can increase your chance of falling. Ask your doctor what other things that you can do to help prevent falls. This information is not intended to replace advice given to you by your health care provider. Make sure you discuss any questions you have with your health care provider. Document Released: 11/08/2008 Document Revised: 06/20/2015 Document Reviewed: 02/16/2014 Elsevier Interactive Patient Education  2017 [employer].

## 2021-02-12 ENCOUNTER — Encounter: Payer: Self-pay | Admitting: Family Medicine

## 2021-02-13 ENCOUNTER — Encounter: Payer: Self-pay | Admitting: Family Medicine

## 2021-02-25 ENCOUNTER — Ambulatory Visit (INDEPENDENT_AMBULATORY_CARE_PROVIDER_SITE_OTHER): Payer: Medicare Other | Admitting: Adult Health

## 2021-02-25 ENCOUNTER — Other Ambulatory Visit: Payer: Self-pay

## 2021-02-25 ENCOUNTER — Encounter (INDEPENDENT_AMBULATORY_CARE_PROVIDER_SITE_OTHER): Payer: Self-pay | Admitting: Adult Health

## 2021-02-25 VITALS — BP 116/70 | HR 81 | Temp 97.9°F | Ht 62.0 in | Wt 191.0 lb

## 2021-02-25 DIAGNOSIS — I10 Essential (primary) hypertension: Secondary | ICD-10-CM | POA: Diagnosis not present

## 2021-02-25 DIAGNOSIS — R7303 Prediabetes: Secondary | ICD-10-CM

## 2021-02-25 DIAGNOSIS — E559 Vitamin D deficiency, unspecified: Secondary | ICD-10-CM

## 2021-02-25 DIAGNOSIS — E669 Obesity, unspecified: Secondary | ICD-10-CM

## 2021-02-25 DIAGNOSIS — Z6835 Body mass index (BMI) 35.0-35.9, adult: Secondary | ICD-10-CM | POA: Diagnosis not present

## 2021-02-25 DIAGNOSIS — Z6837 Body mass index (BMI) 37.0-37.9, adult: Secondary | ICD-10-CM

## 2021-02-25 NOTE — Progress Notes (Signed)
Chief Complaint:   OBESITY Lori Mooney is here to discuss her progress with her obesity treatment plan along with follow-up of her obesity related diagnoses. Lori Mooney is on the Category 2 Plan and states she is following her eating plan approximately 80% of the time. Lori Mooney states she is doing low impact aerobics for 60 minutes 2 times per week.  Today's visit was #: 74 Starting weight: 203 lbs Starting date: 01/30/2020 Today's weight: 191 lbs Today's date: 02/25/2021 Total lbs lost to date: 12 lbs Total lbs lost since last in-office visit: 0  Interim History:  Metformin was started Oct 2022 for worsening polyphagia and weight loss plateau. 10/24/20 CMP Creat 0.91 and GFR >60 01/28/21 CMP GFR >60, A1c and Insulin Level both continue to be above goal despite 4 months of biguanide therapy.  Subjective:   1. Essential hypertension Discussed labs with patient today.  Ambulatory BP readings: SBP 90-120 DBP 70-80 On 01/28/2021, CMP - stable. She contacted her PCP/Dr. Carlota Raspberry with readings and her supported her to continue with 1/2 dose of lisinopril 20/12.5 mg QD.  2. Prediabetes Discussed labs with patient today.  On 01/28/2021, A1c 5.7, BG 96, insulin level 26.6.   Metabolic conditions and polyphagia not well controlled despite 4 months of biguanide therapy. She denies family hx of MTC or personal hx of pancreatitis. Discussed risks/benefits of GLP-1 therapy to replace Metformin.  3. Vitamin D deficiency Discussed labs with patient today.  Vitamin D level on 01/28/2021 - stable. She is on OTC vitamin D.  Assessment/Plan:   1. Essential hypertension Continue with 1/2 dose of lisinopril 20/12.5 mg.  2. Prediabetes Stop metformin. Consider Ozempic initiation at next office visit.  3. Vitamin D deficiency Continue with OTC supplement.  4. Current BMI 35.0  Lori Mooney is currently in the action stage of change. As such, her goal is to continue with weight loss efforts. She has agreed to  the Category 2 Plan.   Exercise goals:  As is.  Behavioral modification strategies: increasing lean protein intake, decreasing simple carbohydrates, meal planning and cooking strategies, keeping healthy foods in the home, and planning for success.  Lori Mooney has agreed to follow-up with our clinic in 4 weeks. She was informed of the importance of frequent follow-up visits to maximize her success with intensive lifestyle modifications for her multiple health conditions.   Objective:   Blood pressure 116/70, pulse 81, temperature 97.9 F (36.6 C), height 5\' 2"  (1.575 m), weight 191 lb (86.6 kg), SpO2 97 %. Body mass index is 34.93 kg/m.  General: Cooperative, alert, well developed, in no acute distress. HEENT: Conjunctivae and lids unremarkable. Cardiovascular: Regular rhythm.  Lungs: Normal work of breathing. Neurologic: No focal deficits.   Lab Results  Component Value Date   CREATININE 0.97 01/28/2021   BUN 18 01/28/2021   NA 142 01/28/2021   K 4.7 01/28/2021   CL 105 01/28/2021   CO2 22 01/28/2021   Lab Results  Component Value Date   ALT 16 01/28/2021   AST 18 01/28/2021   ALKPHOS 78 01/28/2021   BILITOT 0.3 01/28/2021   Lab Results  Component Value Date   HGBA1C 5.7 (H) 01/28/2021   HGBA1C 5.9 10/22/2020   HGBA1C 5.8 (H) 07/15/2020   HGBA1C 6.1 (H) 04/15/2020   HGBA1C 6.0 (H) 01/30/2020   Lab Results  Component Value Date   INSULIN 26.6 (H) 01/28/2021   INSULIN 23.6 07/15/2020   INSULIN 20.7 01/30/2020   Lab Results  Component Value Date  TSH 2.590 01/30/2020   Lab Results  Component Value Date   CHOL 181 10/22/2020   HDL 55.30 10/22/2020   LDLCALC 104 (H) 07/15/2020   LDLDIRECT 89.0 10/22/2020   TRIG 218.0 (H) 10/22/2020   CHOLHDL 3 10/22/2020   Lab Results  Component Value Date   VD25OH 52.0 01/28/2021   VD25OH 45.66 10/24/2020   VD25OH 43.7 07/15/2020   Lab Results  Component Value Date   WBC 6.0 10/22/2020   HGB 14.9 10/22/2020   HCT  45.0 10/22/2020   MCV 89.6 10/22/2020   PLT 273.0 10/22/2020   Attestation Statements:   Reviewed by clinician on day of visit: allergies, medications, problem list, medical history, surgical history, family history, social history, and previous encounter notes.  Time spent on visit including pre-visit chart review and post-visit care and charting was 28 minutes.   I, Water quality scientist, CMA, am acting as Location manager for Mina Marble, NP.  I have reviewed the above documentation for accuracy and completeness, and I agree with the above. -  Madelynn Malson d. Albertha Beattie, NP-C

## 2021-03-17 DIAGNOSIS — L738 Other specified follicular disorders: Secondary | ICD-10-CM | POA: Diagnosis not present

## 2021-03-17 DIAGNOSIS — Z85828 Personal history of other malignant neoplasm of skin: Secondary | ICD-10-CM | POA: Diagnosis not present

## 2021-03-17 DIAGNOSIS — L821 Other seborrheic keratosis: Secondary | ICD-10-CM | POA: Diagnosis not present

## 2021-03-17 DIAGNOSIS — L814 Other melanin hyperpigmentation: Secondary | ICD-10-CM | POA: Diagnosis not present

## 2021-03-17 DIAGNOSIS — D1801 Hemangioma of skin and subcutaneous tissue: Secondary | ICD-10-CM | POA: Diagnosis not present

## 2021-03-17 DIAGNOSIS — D220 Melanocytic nevi of lip: Secondary | ICD-10-CM | POA: Diagnosis not present

## 2021-03-17 DIAGNOSIS — L570 Actinic keratosis: Secondary | ICD-10-CM | POA: Diagnosis not present

## 2021-03-17 DIAGNOSIS — L820 Inflamed seborrheic keratosis: Secondary | ICD-10-CM | POA: Diagnosis not present

## 2021-03-25 ENCOUNTER — Ambulatory Visit (INDEPENDENT_AMBULATORY_CARE_PROVIDER_SITE_OTHER): Payer: Medicare Other | Admitting: Adult Health

## 2021-03-25 ENCOUNTER — Encounter (INDEPENDENT_AMBULATORY_CARE_PROVIDER_SITE_OTHER): Payer: Self-pay | Admitting: Adult Health

## 2021-03-25 ENCOUNTER — Other Ambulatory Visit: Payer: Self-pay

## 2021-03-25 VITALS — BP 127/80 | HR 72 | Temp 98.0°F | Ht 62.0 in | Wt 192.0 lb

## 2021-03-25 DIAGNOSIS — R7303 Prediabetes: Secondary | ICD-10-CM

## 2021-03-25 DIAGNOSIS — Z6835 Body mass index (BMI) 35.0-35.9, adult: Secondary | ICD-10-CM | POA: Diagnosis not present

## 2021-03-25 DIAGNOSIS — E559 Vitamin D deficiency, unspecified: Secondary | ICD-10-CM

## 2021-03-25 DIAGNOSIS — I10 Essential (primary) hypertension: Secondary | ICD-10-CM | POA: Diagnosis not present

## 2021-03-25 DIAGNOSIS — E669 Obesity, unspecified: Secondary | ICD-10-CM

## 2021-03-25 MED ORDER — METFORMIN HCL 500 MG PO TABS: 500.0000 mg | ORAL_TABLET | Freq: Every day | ORAL | 0 refills | Status: DC
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
## 2021-04-03 ENCOUNTER — Telehealth: Payer: Self-pay | Admitting: *Deleted

## 2021-04-03 NOTE — Chronic Care Management (AMB) (Signed)
?  Chronic Care Management  ? ?Note ? ?04/03/2021 ?Name: Lori Mooney MRN: [id number] DOB: [birth year] ? ?Lori Mooney is a 76 y.o. year old female who is a primary care patient of Carlota Raspberry Ranell Patrick, MD. I reached out to [place] by phone today in response to a referral sent by Ms. Jearl Klinefelter Jarvis's PCP. ? ?Lori Mooney was given information about Chronic Care Management services today including:  ?CCM service includes personalized support from designated clinical staff supervised by her physician, including individualized plan of care and coordination with other care providers ?24/7 contact phone numbers for assistance for urgent and routine care needs. ?Service will only be billed when office clinical staff spend 20 minutes or more in a month to coordinate care. ?Only one practitioner may furnish and bill the service in a calendar month. ?The patient may stop CCM services at any time (effective at the end of the month) by phone call to the office staff. ?The patient is responsible for co-pay (up to 20% after annual deductible is met) if co-pay is required by the individual health plan.  ? ?Patient did not agree to enrollment in care management services and does not wish to consider at this time. ? ?Follow up plan: ?Patient declines engagement by the care management team. Appropriate care team members and provider have been notified via electronic communication. The care management team is available to follow up with the patient after provider conversation with the patient regarding recommendation for care management engagement and subsequent re-referral to the care management team.  ? ?Laverda Sorenson  ?Care Guide, Embedded Care Coordination ?[place]  Care Management  ?Direct Dial: [phone] ? ?

## 2021-04-08 DIAGNOSIS — H35372 Puckering of macula, left eye: Secondary | ICD-10-CM | POA: Diagnosis not present

## 2021-04-08 DIAGNOSIS — H25811 Combined forms of age-related cataract, right eye: Secondary | ICD-10-CM | POA: Diagnosis not present

## 2021-04-08 DIAGNOSIS — Z961 Presence of intraocular lens: Secondary | ICD-10-CM | POA: Diagnosis not present

## 2021-04-08 DIAGNOSIS — H40003 Preglaucoma, unspecified, bilateral: Secondary | ICD-10-CM | POA: Diagnosis not present

## 2021-04-15 ENCOUNTER — Ambulatory Visit
Admission: RE | Admit: 2021-04-15 | Discharge: 2021-04-15 | Disposition: A | Discharge status: Home / Self Care | Payer: Medicare Other | Source: Ambulatory Visit | Attending: Family Medicine | Admitting: Family Medicine

## 2021-04-15 DIAGNOSIS — Z780 Asymptomatic menopausal state: Secondary | ICD-10-CM

## 2021-04-15 DIAGNOSIS — M8589 Other specified disorders of bone density and structure, multiple sites: Secondary | ICD-10-CM | POA: Diagnosis not present

## 2021-04-23 ENCOUNTER — Encounter: Payer: Self-pay | Admitting: Family Medicine

## 2021-04-23 DIAGNOSIS — E7800 Pure hypercholesterolemia, unspecified: Secondary | ICD-10-CM

## 2021-04-29 ENCOUNTER — Encounter (INDEPENDENT_AMBULATORY_CARE_PROVIDER_SITE_OTHER): Payer: Self-pay | Admitting: Adult Health

## 2021-04-29 ENCOUNTER — Ambulatory Visit (INDEPENDENT_AMBULATORY_CARE_PROVIDER_SITE_OTHER): Payer: Medicare Other | Admitting: Adult Health

## 2021-04-29 VITALS — BP 136/80 | HR 93 | Temp 98.1°F | Ht 62.0 in | Wt 192.0 lb

## 2021-04-29 DIAGNOSIS — R7303 Prediabetes: Secondary | ICD-10-CM | POA: Diagnosis not present

## 2021-04-29 DIAGNOSIS — Z6835 Body mass index (BMI) 35.0-35.9, adult: Secondary | ICD-10-CM | POA: Diagnosis not present

## 2021-04-29 DIAGNOSIS — M8580 Other specified disorders of bone density and structure, unspecified site: Secondary | ICD-10-CM

## 2021-04-29 MED ORDER — METFORMIN HCL 500 MG PO TABS: 500.0000 mg | ORAL_TABLET | Freq: Every day | ORAL | 0 refills | Status: DC

## 2021-05-06 ENCOUNTER — Other Ambulatory Visit (INDEPENDENT_AMBULATORY_CARE_PROVIDER_SITE_OTHER): Payer: Medicare Other

## 2021-05-06 DIAGNOSIS — E7800 Pure hypercholesterolemia, unspecified: Secondary | ICD-10-CM | POA: Diagnosis not present

## 2021-05-06 LAB — LIPID PANEL
Cholesterol: 168 mg/dL (ref 0–200)
HDL: 64 mg/dL (ref >39.00)
LDL Cholesterol: 67 mg/dL (ref 0–99)
NonHDL: 104.3
Total CHOL/HDL Ratio: 3
Triglycerides: 187 mg/dL — ABNORMAL HIGH (ref 0.0–149.0)
VLDL: 37.4 mg/dL (ref 0.0–40.0)

## 2021-05-06 LAB — COMPREHENSIVE METABOLIC PANEL
ALT: 13 U/L (ref 0–35)
AST: 14 U/L (ref 0–37)
Albumin: 4.4 g/dL (ref 3.5–5.2)
Alkaline Phosphatase: 54 U/L (ref 39–117)
BUN: 19 mg/dL (ref 6–23)
CO2: 26 mEq/L (ref 19–32)
Calcium: 9.8 mg/dL (ref 8.4–10.5)
Chloride: 104 mEq/L (ref 96–112)
Creatinine, Ser: 0.93 mg/dL (ref 0.40–1.20)
GFR: 60.13 mL/min (ref >60.00)
Glucose, Bld: 91 mg/dL (ref 70–99)
Potassium: 4.4 mEq/L (ref 3.5–5.1)
Sodium: 138 mEq/L (ref 135–145)
Total Bilirubin: 0.5 mg/dL (ref 0.2–1.2)
Total Protein: 6.3 g/dL (ref 6.0–8.3)

## 2021-05-07 NOTE — Progress Notes (Signed)
? ? ? ?Chief Complaint:  ? ?OBESITY ?Lori Mooney is here to discuss her progress with her obesity treatment plan along with follow-up of her obesity related diagnoses. Lori Mooney is on the Category 2 Plan and states she is following her eating plan approximately 80% of the time. Lori Mooney states she is doing low impact exercise for 45 minutes 2 times per week. ? ?Today's visit was #: 19 ?Starting weight: 203 lbs ?Starting date: 01/30/2020 ?Today's weight: 192 lbs ?Today's date: 04/29/2021 ?Total lbs lost to date: 11 lbs ?Total lbs lost since last in-office visit: 0 ? ?Interim History:  ?Lori Mooney says Ozempic was cost prohibitive.  Restarted metformin at last office visit on 03/25/2021 - tolerating well. ? ?Subjective:  ? ?1. Prediabetes ?Lori Mooney says Ozempic was cost prohibitive.  Restarted metformin at last office visit on 03/25/2021. ?She denies abd cramping or diarrhea. ? ?2. Osteopenia, unspecified location ?On 04/15/2021 - DEXA: ?RECOMMENDATION: ?1. All patients should optimize calcium and vitamin D intake. ?2. Consider FDA-approved medical therapies in postmenopausal women ?and men aged 82 years and older, based on the following: ?a. A hip or vertebral (clinical or morphometric) fracture. ?b. T-score = -2.5 at the femoral neck or spine after appropriate ?evaluation to exclude secondary causes. ?c. Low bone mass (T-score between -1.0 and -2.5 at the femoral neck ?or spine) and a 10-year probability of a hip fracture = 3% or a ?10-year probability of a major osteoporosis-related fracture = 20% ?based on the [place]-adapted WHO algorithm. ?d. Clinician judgment and/or patient preferences may indicate ?treatment for people with 10-year fracture probabilities above or ?below these levels. ? ? ?Assessment/Plan:  ? ?1. Prediabetes ?Refill metformin 500 mg daily. ? ?- Refill metFORMIN (GLUCOPHAGE) 500 MG tablet; Take 1 tablet (500 mg total) by mouth daily with breakfast.  Dispense: 90 tablet; Refill: 0 ? ?2. Osteopenia, unspecified  location ?Continue OTC vitamin D3 1000 IU - 2 capsules daily. ?Start calcium carbonate 40%. ? ?3. Current BMI 35.1 ? ?Lori Mooney is currently in the action stage of change. As such, her goal is to continue with weight loss efforts. She has agreed to the Category 2 Plan.  ? ?Exercise goals:  As is. ? ?Behavioral modification strategies: increasing lean protein intake, decreasing simple carbohydrates, meal planning and cooking strategies, keeping healthy foods in the home, and planning for success. ? ?Lori Mooney has agreed to follow-up with our clinic in 4 weeks. She was informed of the importance of frequent follow-up visits to maximize her success with intensive lifestyle modifications for her multiple health conditions.  ? ?Objective:  ? ?Blood pressure 136/80, pulse 93, temperature 98.1 ?F (36.7 ?C), height '5\' 2"'$  (1.575 m), weight 192 lb (87.1 kg), SpO2 96 %. ?Body mass index is 35.12 kg/m?. ? ?General: Cooperative, alert, well developed, in no acute distress. ?HEENT: Conjunctivae and lids unremarkable. ?Cardiovascular: Regular rhythm.  ?Lungs: Normal work of breathing. ?Neurologic: No focal deficits.  ? ?Lab Results  ?Component Value Date  ? CREATININE 0.93 05/06/2021  ? BUN 19 05/06/2021  ? NA 138 05/06/2021  ? K 4.4 05/06/2021  ? CL 104 05/06/2021  ? CO2 26 05/06/2021  ? ?Lab Results  ?Component Value Date  ? ALT 13 05/06/2021  ? AST 14 05/06/2021  ? ALKPHOS 54 05/06/2021  ? BILITOT 0.5 05/06/2021  ? ?Lab Results  ?Component Value Date  ? HGBA1C 5.7 (H) 01/28/2021  ? HGBA1C 5.9 10/22/2020  ? HGBA1C 5.8 (H) 07/15/2020  ? HGBA1C 6.1 (H) 04/15/2020  ? HGBA1C 6.0 (H) 01/30/2020  ? ?  Lab Results  ?Component Value Date  ? INSULIN 26.6 (H) 01/28/2021  ? INSULIN 23.6 07/15/2020  ? INSULIN 20.7 01/30/2020  ? ?Lab Results  ?Component Value Date  ? TSH 2.590 01/30/2020  ? ?Lab Results  ?Component Value Date  ? CHOL 168 05/06/2021  ? HDL 64.00 05/06/2021  ? [place] 67 05/06/2021  ? LDLDIRECT 89.0 10/22/2020  ? TRIG 187.0 (H)  05/06/2021  ? CHOLHDL 3 05/06/2021  ? ?Lab Results  ?Component Value Date  ? VD25OH 52.0 01/28/2021  ? VD25OH 45.66 10/24/2020  ? VD25OH 43.7 07/15/2020  ? ?Lab Results  ?Component Value Date  ? WBC 6.0 10/22/2020  ? HGB 14.9 10/22/2020  ? HCT 45.0 10/22/2020  ? MCV 89.6 10/22/2020  ? PLT 273.0 10/22/2020  ? ?Obesity Behavioral Intervention:  ? ?Approximately 15 minutes were spent on the discussion below. ? ?ASK: ?We discussed the diagnosis of obesity with Lori Mooney today and Lori Mooney agreed to give [place] permission to discuss obesity behavioral modification therapy today. ? ?ASSESS: ?Lori Mooney has the diagnosis of obesity and her BMI today is 35.1. Lori Mooney is in the action stage of change.  ? ?ADVISE: ?Lori Mooney was educated on the multiple health risks of obesity as well as the benefit of weight loss to improve her health. She was advised of the need for long term treatment and the importance of lifestyle modifications to improve her current health and to decrease her risk of future health problems. ? ?AGREE: ?Multiple dietary modification options and treatment options were discussed and Lori Mooney agreed to follow the recommendations documented in the above note. ? ?ARRANGE: ?Lori Mooney was educated on the importance of frequent visits to treat obesity as outlined per CMS and USPSTF guidelines and agreed to schedule her next follow up appointment today. ? ?Attestation Statements:  ? ?Reviewed by clinician on day of visit: allergies, medications, problem list, medical history, surgical history, family history, social history, and previous encounter notes. ? ?I, Water quality scientist, CMA, am acting as Location manager for Mina Marble, NP. ? ?I have reviewed the above documentation for accuracy and completeness, and I agree with the above. -  Jayren Cease d. Trusten Hume, NP-C ?

## 2021-05-08 ENCOUNTER — Ambulatory Visit (INDEPENDENT_AMBULATORY_CARE_PROVIDER_SITE_OTHER): Payer: Medicare Other | Admitting: Family Medicine

## 2021-05-08 ENCOUNTER — Encounter: Payer: Self-pay | Admitting: Family Medicine

## 2021-05-08 ENCOUNTER — Ambulatory Visit: Payer: Medicare Other | Admitting: Family Medicine

## 2021-05-08 VITALS — BP 126/70 | HR 80 | Temp 98.1°F | Resp 16 | Ht 62.0 in | Wt 194.8 lb

## 2021-05-08 DIAGNOSIS — I10 Essential (primary) hypertension: Secondary | ICD-10-CM

## 2021-05-08 DIAGNOSIS — M549 Dorsalgia, unspecified: Secondary | ICD-10-CM

## 2021-05-08 DIAGNOSIS — R7303 Prediabetes: Secondary | ICD-10-CM | POA: Diagnosis not present

## 2021-05-08 DIAGNOSIS — M5430 Sciatica, unspecified side: Secondary | ICD-10-CM | POA: Diagnosis not present

## 2021-05-08 DIAGNOSIS — E7800 Pure hypercholesterolemia, unspecified: Secondary | ICD-10-CM | POA: Diagnosis not present

## 2021-05-08 MED ORDER — LISINOPRIL-HYDROCHLOROTHIAZIDE 10-12.5 MG PO TABS: 1.0000 | ORAL_TABLET | Freq: Every day | ORAL | 3 refills | Status: DC

## 2021-05-08 MED ORDER — ATORVASTATIN CALCIUM 20 MG PO TABS: 20.0000 mg | ORAL_TABLET | Freq: Every day | ORAL | 3 refills | Status: DC

## 2021-05-08 MED ORDER — METFORMIN HCL 500 MG PO TABS: 500.0000 mg | ORAL_TABLET | Freq: Every day | ORAL | 1 refills | Status: DC

## 2021-05-08 NOTE — Progress Notes (Signed)
? ?Subjective:  ?Patient ID: Lori Mooney, female    DOB: [birth year]  Age: 76 y.o. MRN: [id number] ? ?CC:  ?Chief Complaint  ?Patient presents with  ? Prediabetes  ?  Pt here for recheck, had recent labs  ? Hypertension  ?  Due for recheck notes she takes a half pill of losartan unless she feels like she needs a whole one.   ? Hyperlipidemia  ?  Pt due for recheck no concerns   ? ? ?HPI ?Lori Mooney presents for  ? ? ?Prediabetes: ?Additionally has been followed by [facility], appointment with Mina Marble on 04/29/2021. ?Metformin 500 mg daily.  ?Wt Readings from Last 3 Encounters:  ?05/08/21 194 lb 12.8 oz (88.4 kg)  ?04/29/21 192 lb (87.1 kg)  ?03/25/21 192 lb (87.1 kg)  ? ?Lab Results  ?Component Value Date  ? HGBA1C 5.7 (H) 01/28/2021  ? ?Wt Readings from Last 3 Encounters:  ?05/08/21 194 lb 12.8 oz (88.4 kg)  ?04/29/21 192 lb (87.1 kg)  ?03/25/21 192 lb (87.1 kg)  ? ?Hypertension: ?Lisinopril HCTZ 20/12.5 mg daily. Has some low readings at time, decreased to 1/2 pill of lisinopril/hct. Home readings: 94/84-115/71.  ?Half pill 4 days per week. Full pill this morning.  ? ?BP Readings from Last 3 Encounters:  ?05/08/21 126/70  ?04/29/21 136/80  ?03/25/21 127/80  ? ?Lab Results  ?Component Value Date  ? CREATININE 0.93 05/06/2021  ? ?Hyperlipidemia: ?Lipitor '20mg'$  qd.  ?Lab Results  ?Component Value Date  ? CHOL 168 05/06/2021  ? HDL 64.00 05/06/2021  ? [place] 67 05/06/2021  ? LDLDIRECT 89.0 10/22/2020  ? TRIG 187.0 (H) 05/06/2021  ? CHOLHDL 3 05/06/2021  ? ?Lab Results  ?Component Value Date  ? ALT 13 05/06/2021  ? AST 14 05/06/2021  ? ALKPHOS 54 05/06/2021  ? BILITOT 0.5 05/06/2021  ?Trig's decreased from 218 in 09/2020.  ?On calcium supplement.  ? ?Back pain, sciatica: ?History of spinal stenosis.  Back pain discussed in April 2021.  Treated with Tylenol twice per day at that time.  On chart review MRI lumbar spine in May 2019 with advanced multilevel degenerative disc disease.   Spinal stenosis advanced at L3-4, moderate to advanced at L4-5.  Moderate foraminal narrowing on the right at L4-5 and right more than left at L5-S1.  L2-3 left subarticular recess stenosis which could affected left L3 nerve root.  She is referred to orthopedics from Dr. Mitchel Honour at that time. ?Dr. Durward Fortes.  ?Sciatica down both sides. No bowel or bladder incontinence, no saddle anesthesia, no lower extremity weakness. ? ? ? ? ? ?History ?Patient Active Problem List  ? Diagnosis Date Noted  ? Status post colonoscopy 09/26/2020  ? Insulin resistance 02/28/2020  ? Other hyperlipidemia 02/14/2020  ? Nonspecific abnormal electrocardiogram (ECG) 02/14/2020  ? Essential hypertension 01/30/2020  ? Hypertriglyceridemia 01/30/2020  ? Other fatigue 01/30/2020  ? Prediabetes 01/30/2020  ? Osteoarthritis 01/30/2020  ? Vitamin D deficiency 01/30/2020  ? SOBOE (shortness of breath on exertion) 01/30/2020  ? H/O total knee replacement, left 08/24/2018  ? Osteoarthritis of spine with radiculopathy, lumbar region 06/25/2017  ? Lumbar pain 06/25/2017  ? Spinal stenosis of lumbar region without neurogenic claudication 06/25/2017  ? Sciatica of right side 06/19/2017  ? GERD (gastroesophageal reflux disease) 09/19/2013  ? Vertigo 03/02/2012  ? Hypercholesterolemia 10/08/2010  ? Benign hypertensive heart disease without heart failure 10/08/2010  ? Class 2 severe obesity with serious comorbidity and body mass index (BMI) of  37.0 to 37.9 in adult [facility]) 10/08/2010  ? ?Past Medical History:  ?Diagnosis Date  ? Allergy   ? Arthritis   ? history of arthritis in the fingers  ? Cataract   ? Constipation   ? DDD (degenerative disc disease), lumbar   ? Exogenous obesity   ? GERD (gastroesophageal reflux disease)   ? Heartburn   ? Hypercholesterolemia   ? Hyperlipidemia   ? hypercholesterolemia  ? Hypertension   ? Hypertriglyceridemia   ? Osteoarthritis   ? Palpitations   ? Prediabetes   ? SOB (shortness of breath) on exertion   ? Swelling of both  lower extremities   ? ?Past Surgical History:  ?Procedure Laterality Date  ? CATARACT EXTRACTION W/ INTRAOCULAR LENS IMPLANT Left   ? DILITATION & CURRETTAGE/HYSTROSCOPY WITH VERSAPOINT RESECTION N/A 08/25/2012  ? Procedure: DILATATION & CURETTAGE/HYSTEROSCOPY WITH VERSAPOINT RESECTION;  Surgeon: Princess Bruins, MD;  Location: [facility];  Service: Gynecology;  Laterality: N/A;  ? EYE SURGERY    ? LUMBAR EPIDURAL INJECTION  08/04/2017  ? TONSILLECTOMY    ? 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  ? Procedure: TOTAL KNEE ARTHROPLASTY;  Surgeon: [name], MD;  Location: [facility];  Service: Orthopedics;  Laterality: Left;  157mn  ? TUBAL LIGATION    ? ?Allergies  ?Allergen Reactions  ? Shellfish Allergy Hives  ? Strawberry Extract Hives  ? ?Prior to Admission medications   ?Medication Sig Start Date End Date Taking? Authorizing Provider  ?acetaminophen (TYLENOL) 325 MG tablet Take 650 mg by mouth every 6 (six) hours as needed.   Yes [provider]  ?atorvastatin (LIPITOR) 20 MG tablet Take 1 tablet by mouth once daily 11/20/20  Yes GWendie Agreste MD  ?Calcium Citrate 150 MG CAPS Take by mouth.   Yes [provider]  ?Cholecalciferol (VITAMIN D-3) 1000 UNITS CAPS Take 1,000 Units by mouth daily.   Yes [provider]  ?famotidine (PEPCID) 10 MG tablet Take 10 mg by mouth at bedtime.    Yes [provider]  ?lisinopril-hydrochlorothiazide (ZESTORETIC) 20-12.5 MG tablet Take 1 tablet by mouth once daily ?Patient taking differently: Pt takes 1/2 tab qd 11/20/20  Yes GWendie Agreste MD  ?loratadine (CLARITIN) 10 MG tablet Take 10 mg by mouth daily as needed for allergies.    Yes [provider]  ?magnesium 30 MG tablet Take 250 mg by mouth daily.    Yes [provider]  ?meloxicam (MOBIC) 7.5 MG tablet Take 1 tablet (7.5 mg total) by mouth daily as needed for pain. ?Patient taking differently: Take 7.5 mg by mouth daily as needed for pain. PRN 04/19/20  Yes  GWendie Agreste MD  ?metFORMIN (GLUCOPHAGE) 500 MG tablet Take 1 tablet (500 mg total) by mouth daily with breakfast. 04/29/21  Yes Danford, KBerna Spare NP  ?Biotin w/ Vitamins C & E (HAIR/SKIN/NAILS PO) Take by mouth. Natures Bounty ?Patient not taking: Reported on 05/08/2021    [provider]  ? ?Social History  ? ?Socioeconomic History  ? Marital status: Married  ?  Spouse name: JYerania Chamorro ? Number of children: 2  ? Years of education: Not on file  ? Highest education level: Not on file  ?Occupational History  ? Occupation: Retired RTherapist, sports ?Tobacco Use  ? Smoking status: Never  ? Smokeless tobacco: Never  ?Vaping Use  ? Vaping Use: Never used  ?Substance and Sexual Activity  ? Alcohol use: Yes  ?  Alcohol/week: 1.0 - 2.0 standard drink  ?  Types: 1 - 2 Standard drinks or equivalent per week  ? Drug use: No  ? Sexual activity: Yes  ?Other Topics Concern  ? Not on file  ?Social History Narrative  ? Married. Education: [employer].  ? ?Social Determinants of Health  ? ?Financial Resource Strain: Low Risk   ? Difficulty of Paying Living Expenses: Not hard at all  ?Food Insecurity: No Food Insecurity  ? Worried About Charity fundraiser in the Last Year: Never true  ? Ran Out of Food in the Last Year: Never true  ?Transportation Needs: No Transportation Needs  ? Lack of Transportation (Medical): No  ? Lack of Transportation (Non-Medical): No  ?Physical Activity: Sufficiently Active  ? Days of Exercise per Week: 3 days  ? Minutes of Exercise per Session: 60 min  ?Stress: No Stress Concern Present  ? Feeling of Stress : Not at all  ?Social Connections: Moderately Integrated  ? Frequency of Communication with Friends and Family: Twice a week  ? Frequency of Social Gatherings with Friends and Family: Twice a week  ? Attends Religious Services: 1 to 4 times per year  ? Active Member of Clubs or Organizations: No  ? Attends Archivist Meetings: Never  ? Marital Status: Married  ?Intimate Partner Violence: Not At  Risk  ? Fear of Current or Ex-Partner: No  ? Emotionally Abused: No  ? Physically Abused: No  ? Sexually Abused: No  ? ? ?Review of Systems  ?Constitutional:  Negative for fatigue and unexpected weight change

## 2021-05-08 NOTE — Patient Instructions (Addendum)
New dose of lisinopril combo. Keep a record of your blood pressures outside of the office and if too high or low - follow up and we can adjust meds further.  ?Cholesterol labs look good. No changes in lipitor for now.  ?I will refer you to back specialist. Tylenol is ok if needed for back pain.  ?Return to the clinic or go to the nearest emergency room if any of your symptoms worsen or new symptoms occur. ? ? ?

## 2021-05-09 DIAGNOSIS — M8580 Other specified disorders of bone density and structure, unspecified site: Secondary | ICD-10-CM | POA: Insufficient documentation

## 2021-05-14 DIAGNOSIS — M545 Low back pain, unspecified: Secondary | ICD-10-CM | POA: Diagnosis not present

## 2021-05-24 DIAGNOSIS — U071 COVID-19: Secondary | ICD-10-CM | POA: Diagnosis not present

## 2021-05-27 ENCOUNTER — Encounter (INDEPENDENT_AMBULATORY_CARE_PROVIDER_SITE_OTHER): Payer: Self-pay | Admitting: Adult Health

## 2021-05-27 ENCOUNTER — Ambulatory Visit (INDEPENDENT_AMBULATORY_CARE_PROVIDER_SITE_OTHER): Payer: Medicare Other | Admitting: Adult Health

## 2021-05-27 VITALS — BP 119/73 | HR 77 | Temp 97.7°F | Ht 62.0 in | Wt 192.0 lb

## 2021-05-27 DIAGNOSIS — M549 Dorsalgia, unspecified: Secondary | ICD-10-CM

## 2021-05-27 DIAGNOSIS — M5430 Sciatica, unspecified side: Secondary | ICD-10-CM | POA: Diagnosis not present

## 2021-05-27 DIAGNOSIS — Z6837 Body mass index (BMI) 37.0-37.9, adult: Secondary | ICD-10-CM | POA: Diagnosis not present

## 2021-05-27 DIAGNOSIS — E66812 Obesity, class 2: Secondary | ICD-10-CM

## 2021-06-05 NOTE — Progress Notes (Signed)
? ? ? ?Chief Complaint:  ? ?OBESITY ?Lori Mooney is here to discuss her progress with her obesity treatment plan along with follow-up of her obesity related diagnoses. Lori Mooney is on the Category 2 Plan and states she is following her eating plan approximately 80% of the time. Lori Mooney states she is doing aerobics 45 minutes 2 times per week. ? ?Today's visit was #: 20 ?Starting weight: 203 lbs ?Starting date: 01/30/2020 ?Today's weight: 192 lbs ?Today's date: 05/27/2021 ?Total lbs lost to date: 11 ?Total lbs lost since last in-office visit: 0 ? ?Interim History:  ?Lori Mooney recently celebrated her mother's 97th birthday in [place]. ? ?05/08/2021 PCP Notes: ?Back pain, sciatica: ?History of spinal stenosis.  Back pain discussed in April 2021.  Treated with Tylenol twice per day at that time.  On chart review MRI lumbar spine in May 2019 with advanced multilevel degenerative disc disease.  Spinal stenosis advanced at L3-4, moderate to advanced at L4-5.  Moderate foraminal narrowing on the right at L4-5 and right more than left at L5-S1.  L2-3 left subarticular recess stenosis which could affected left L3 nerve root.  She is referred to orthopedics from Dr. Mitchel Honour at that time. ?Dr. Durward Fortes.  ?Sciatica down both sides. No bowel or bladder incontinence, no saddle anesthesia, no lower extremity weakness. ? ?She completed Prednisone tape on 05/25/21, her current pain level 0/10. ? ?Subjective:  ? ?1. Back pain with sciatica ?05/08/2021 PCP Notes: ?Back pain, sciatica: ?History of spinal stenosis.  Back pain discussed in April 2021.  Treated with Tylenol twice per day at that time.  On chart review MRI lumbar spine in May 2019 with advanced multilevel degenerative disc disease.  Spinal stenosis advanced at L3-4, moderate to advanced at L4-5.  Moderate foraminal narrowing on the right at L4-5 and right more than left at L5-S1.  L2-3 left subarticular recess stenosis which could affected left L3 nerve root.  She is referred to orthopedics  from Dr. Mitchel Honour at that time. ?Dr. Durward Fortes.  ?Sciatica down both sides. No bowel or bladder incontinence, no saddle anesthesia, no lower extremity weakness. ? ?She completed Prednisone tape on 05/25/21, her current pain level 0/10. ? ?Assessment/Plan:  ? ?1. Back pain with sciatica ?Lori Mooney is to continue activity as tolerated. ? ?2. Current BMI 35.1 ?Lori Mooney is currently in the action stage of change. As such, her goal is to continue with weight loss efforts. She has agreed to the Category 2 Plan.  ? ?Exercise goals: As is. ? ?Check fasting - insulin/A1c,  Lori Mooney. Lori Mooney aware to arrive 30 minutes early. ? ?Behavioral modification strategies: increasing lean protein intake, decreasing simple carbohydrates, meal planning and cooking strategies, keeping healthy foods in the home, and planning for success. ? ?Lori Mooney has agreed to follow-up with our clinic in 4 weeks. She was informed of the importance of frequent follow-up visits to maximize her success with intensive lifestyle modifications for her multiple health conditions.  ? ?Objective:  ? ?Blood pressure 119/73, pulse 77, temperature 97.7 ?F (36.5 ?C), height '5\' 2"'$  (1.575 m), weight 192 lb (87.1 kg), SpO2 98 %. ?Body mass index is 35.12 kg/m?. ? ?General: Cooperative, alert, well developed, in no acute distress. ?HEENT: Conjunctivae and lids unremarkable. ?Cardiovascular: Regular rhythm.  ?Lungs: Normal work of breathing. ?Neurologic: No focal deficits.  ? ?Lab Results  ?Component Value Date  ? CREATININE 0.93 05/06/2021  ? BUN 19 05/06/2021  ? NA 138 05/06/2021  ? K 4.4 05/06/2021  ? CL 104 05/06/2021  ? CO2 26 05/06/2021  ? ?  Lab Results  ?Component Value Date  ? ALT 13 05/06/2021  ? AST 14 05/06/2021  ? ALKPHOS 54 05/06/2021  ? BILITOT 0.5 05/06/2021  ? ?Lab Results  ?Component Value Date  ? HGBA1C 5.7 (H) 01/28/2021  ? HGBA1C 5.9 10/22/2020  ? HGBA1C 5.8 (H) 07/15/2020  ? HGBA1C 6.1 (H) 04/15/2020  ? HGBA1C 6.0 (H) 01/30/2020  ? ?Lab Results  ?Component Value  Date  ? INSULIN 26.6 (H) 01/28/2021  ? INSULIN 23.6 07/15/2020  ? INSULIN 20.7 01/30/2020  ? ?Lab Results  ?Component Value Date  ? TSH 2.590 01/30/2020  ? ?Lab Results  ?Component Value Date  ? CHOL 168 05/06/2021  ? HDL 64.00 05/06/2021  ? Lori Mooney 67 05/06/2021  ? LDLDIRECT 89.0 10/22/2020  ? TRIG 187.0 (H) 05/06/2021  ? CHOLHDL 3 05/06/2021  ? ?Lab Results  ?Component Value Date  ? VD25OH 52.0 01/28/2021  ? VD25OH 45.66 10/24/2020  ? VD25OH 43.7 07/15/2020  ? ?Lab Results  ?Component Value Date  ? WBC 6.0 10/22/2020  ? HGB 14.9 10/22/2020  ? HCT 45.0 10/22/2020  ? MCV 89.6 10/22/2020  ? PLT 273.0 10/22/2020  ? ?No results found for: IRON, TIBC, FERRITIN ? ?Obesity Behavioral Intervention:  ? ?Approximately 15 minutes were spent on the discussion below. ? ?ASK: ?We discussed the diagnosis of obesity with Lori Mooney today and Lori Mooney agreed to give [place] permission to discuss obesity behavioral modification therapy today. ? ?ASSESS: ?Lori Mooney has the diagnosis of obesity and her BMI today is 35.1. Lori Mooney is in the action stage of change.  ? ?ADVISE: ?Lori Mooney was educated on the multiple health risks of obesity as well as the benefit of weight loss to improve her health. She was advised of the need for long term treatment and the importance of lifestyle modifications to improve her current health and to decrease her risk of future health problems. ? ?AGREE: ?Multiple dietary modification options and treatment options were discussed and Lori Mooney agreed to follow the recommendations documented in the above note. ? ?ARRANGE: ?Lori Mooney was educated on the importance of frequent visits to treat obesity as outlined per CMS and USPSTF guidelines and agreed to schedule her next follow up appointment today. ? ?Attestation Statements:  ? ?Reviewed by clinician on day of visit: allergies, medications, problem list, medical history, surgical history, family history, social history, and previous encounter notes. ? ?I, Brendell Tyus, RMA, am  acting as transcriptionist for Mina Marble, NP. ? ?I have reviewed the above documentation for accuracy and completeness, and I agree with the above. -  Hawthorne Day d. Dannilynn Gallina, NP-C ?

## 2021-06-27 ENCOUNTER — Other Ambulatory Visit: Payer: Medicare Other

## 2021-07-01 ENCOUNTER — Other Ambulatory Visit (INDEPENDENT_AMBULATORY_CARE_PROVIDER_SITE_OTHER): Payer: Self-pay | Admitting: Adult Health

## 2021-07-01 ENCOUNTER — Ambulatory Visit (INDEPENDENT_AMBULATORY_CARE_PROVIDER_SITE_OTHER): Payer: Medicare Other | Admitting: Adult Health

## 2021-07-01 ENCOUNTER — Encounter (INDEPENDENT_AMBULATORY_CARE_PROVIDER_SITE_OTHER): Payer: Self-pay | Admitting: Adult Health

## 2021-07-01 VITALS — BP 104/70 | HR 87 | Temp 98.1°F | Ht 62.0 in | Wt 192.0 lb

## 2021-07-01 DIAGNOSIS — R7303 Prediabetes: Secondary | ICD-10-CM

## 2021-07-01 DIAGNOSIS — R0602 Shortness of breath: Secondary | ICD-10-CM

## 2021-07-01 DIAGNOSIS — Z6835 Body mass index (BMI) 35.0-35.9, adult: Secondary | ICD-10-CM | POA: Diagnosis not present

## 2021-07-01 DIAGNOSIS — E669 Obesity, unspecified: Secondary | ICD-10-CM

## 2021-07-01 DIAGNOSIS — Z7984 Long term (current) use of oral hypoglycemic drugs: Secondary | ICD-10-CM

## 2021-07-01 DIAGNOSIS — E559 Vitamin D deficiency, unspecified: Secondary | ICD-10-CM | POA: Diagnosis not present

## 2021-07-02 ENCOUNTER — Other Ambulatory Visit (INDEPENDENT_AMBULATORY_CARE_PROVIDER_SITE_OTHER): Payer: Self-pay | Admitting: Adult Health

## 2021-07-02 ENCOUNTER — Encounter (INDEPENDENT_AMBULATORY_CARE_PROVIDER_SITE_OTHER): Payer: Self-pay | Admitting: Adult Health

## 2021-07-02 DIAGNOSIS — R7303 Prediabetes: Secondary | ICD-10-CM

## 2021-07-02 LAB — COMPREHENSIVE METABOLIC PANEL
ALT: 14 IU/L (ref 0–32)
AST: 14 IU/L (ref 0–40)
Albumin/Globulin Ratio: 2.4 — ABNORMAL HIGH (ref 1.2–2.2)
Albumin: 4.6 g/dL (ref 3.7–4.7)
Alkaline Phosphatase: 62 IU/L (ref 44–121)
BUN/Creatinine Ratio: 17 (ref 12–28)
BUN: 16 mg/dL (ref 8–27)
Bilirubin Total: 0.3 mg/dL (ref 0.0–1.2)
CO2: 18 mmol/L — ABNORMAL LOW (ref 20–29)
Calcium: 10 mg/dL (ref 8.7–10.3)
Chloride: 103 mmol/L (ref 96–106)
Creatinine, Ser: 0.93 mg/dL (ref 0.57–1.00)
Globulin, Total: 1.9 g/dL (ref 1.5–4.5)
Glucose: 98 mg/dL (ref 70–99)
Potassium: 4.5 mmol/L (ref 3.5–5.2)
Sodium: 141 mmol/L (ref 134–144)
Total Protein: 6.5 g/dL (ref 6.0–8.5)
eGFR: 64 mL/min/{1.73_m2} (ref >59)

## 2021-07-02 LAB — INSULIN, RANDOM: INSULIN: 20.4 u[IU]/mL (ref 2.6–24.9)

## 2021-07-02 LAB — HEMOGLOBIN A1C
Est. average glucose Bld gHb Est-mCnc: 120 mg/dL
Hgb A1c MFr Bld: 5.8 % — ABNORMAL HIGH (ref 4.8–5.6)

## 2021-07-02 LAB — VITAMIN B12: Vitamin B-12: 535 pg/mL (ref 232–1245)

## 2021-07-02 LAB — VITAMIN D 25 HYDROXY (VIT D DEFICIENCY, FRACTURES): Vit D, 25-Hydroxy: 50.2 ng/mL (ref 30.0–100.0)

## 2021-07-02 MED ORDER — METFORMIN HCL 500 MG PO TABS: ORAL_TABLET | ORAL | 0 refills | Status: DC

## 2021-07-02 NOTE — Telephone Encounter (Signed)
FYI

## 2021-07-03 ENCOUNTER — Encounter (INDEPENDENT_AMBULATORY_CARE_PROVIDER_SITE_OTHER): Payer: Self-pay | Admitting: Adult Health

## 2021-07-03 NOTE — Telephone Encounter (Signed)
Please advise 

## 2021-07-06 DIAGNOSIS — R0602 Shortness of breath: Secondary | ICD-10-CM | POA: Insufficient documentation

## 2021-07-06 NOTE — Progress Notes (Unsigned)
Chief Complaint:   OBESITY Lori Mooney is here to discuss her progress with her obesity treatment plan along with follow-up of her obesity related diagnoses. Lori Mooney is on the Category 2 Plan and states she is following her eating plan approximately 70% of the time. Lori Mooney states she is doing low impact 45 minutes 2 times per week.  Today's visit was #: 21 Starting weight: 203 lbs Starting date: 01/30/2020 Today's weight: 192 lbs Today's date: 07/01/2021 Total lbs lost to date: 11 lbs Total lbs lost since last in-office visit: 0   Interim History: 01/30/2020 RMR 1306, 07/15/20, RMR 1759, RMR today 1325.  Her 76 year old mother passed away.  Her parents were married 62 years.   Subjective:   1. Vitamin D deficiency She is on OTC Vitamin D3, 1,000 IU daily and calcium citrate.    2. Prediabetes Lori Mooney restarted Metformin 500 mg daily, 03/25/2021. She reports afternoon polyphagia.    3. SOB (shortness of breath) on exertion ***  Assessment/Plan:   1. Vitamin D deficiency Check labs.  - VITAMIN D 25 Hydroxy (Vit-D Deficiency, Fractures)  2. Prediabetes Check labs, my chart Lori Mooney after labs.  Refill Metformin, If insulin >15, then increase metformin. - Comprehensive metabolic panel; Future - Hemoglobin A1c - Insulin, random - Vitamin B12 - Comprehensive metabolic panel  3. SOB (shortness of breath) on exertion Check IC.   4. Obesity, current BMI 35.2 Lori Mooney is currently in the action stage of change. As such, her goal is to continue with weight loss efforts. She has agreed to the Category 1 Plan.   Exercise goals:  As is.   Behavioral modification strategies: increasing lean protein intake, decreasing simple carbohydrates, meal planning and cooking strategies, keeping healthy foods in the home, and planning for success.  Lori Mooney has agreed to follow-up with our clinic in 4 weeks. She was informed of the importance of frequent follow-up visits to maximize her success  with intensive lifestyle modifications for her multiple health conditions.   Objective:   Blood pressure 104/70, pulse 87, temperature 98.1 F (36.7 C), height '5\' 2"'$  (1.575 m), weight 192 lb (87.1 kg), SpO2 95 %. Body mass index is 35.12 kg/m.  General: Cooperative, alert, well developed, in no acute distress. HEENT: Conjunctivae and lids unremarkable. Cardiovascular: Regular rhythm.  Lungs: Normal work of breathing. Neurologic: No focal deficits.   Lab Results  Component Value Date   CREATININE 0.93 07/01/2021   BUN 16 07/01/2021   NA 141 07/01/2021   K 4.5 07/01/2021   CL 103 07/01/2021   CO2 18 (L) 07/01/2021   Lab Results  Component Value Date   ALT 14 07/01/2021   AST 14 07/01/2021   ALKPHOS 62 07/01/2021   BILITOT 0.3 07/01/2021   Lab Results  Component Value Date   HGBA1C 5.8 (H) 07/01/2021   HGBA1C 5.7 (H) 01/28/2021   HGBA1C 5.9 10/22/2020   HGBA1C 5.8 (H) 07/15/2020   HGBA1C 6.1 (H) 04/15/2020   Lab Results  Component Value Date   INSULIN 20.4 07/01/2021   INSULIN 26.6 (H) 01/28/2021   INSULIN 23.6 07/15/2020   INSULIN 20.7 01/30/2020   Lab Results  Component Value Date   TSH 2.590 01/30/2020   Lab Results  Component Value Date   CHOL 168 05/06/2021   HDL 64.00 05/06/2021   LDLCALC 67 05/06/2021   LDLDIRECT 89.0 10/22/2020   TRIG 187.0 (H) 05/06/2021   CHOLHDL 3 05/06/2021   Lab Results  Component Value Date  VD25OH 50.2 07/01/2021   VD25OH 52.0 01/28/2021   VD25OH 45.66 10/24/2020   Lab Results  Component Value Date   WBC 6.0 10/22/2020   HGB 14.9 10/22/2020   HCT 45.0 10/22/2020   MCV 89.6 10/22/2020   PLT 273.0 10/22/2020   No results found for: "IRON", "TIBC", "FERRITIN"  Obesity Behavioral Intervention:   Approximately 15 minutes were spent on the discussion below.  ASK: We discussed the diagnosis of obesity with Lori Mooney today and Lori Mooney agreed to give [place] permission to discuss obesity behavioral modification therapy  today.  ASSESS: Lori Mooney has the diagnosis of obesity and her BMI today is 35.2. Lori Mooney is in the action stage of change.   ADVISE: Lori Mooney was educated on the multiple health risks of obesity as well as the benefit of weight loss to improve her health. She was advised of the need for long term treatment and the importance of lifestyle modifications to improve her current health and to decrease her risk of future health problems.  AGREE: Multiple dietary modification options and treatment options were discussed and Lori Mooney agreed to follow the recommendations documented in the above note.  ARRANGE: Lori Mooney was educated on the importance of frequent visits to treat obesity as outlined per CMS and USPSTF guidelines and agreed to schedule her next follow up appointment today.  Attestation Statements:   Reviewed by clinician on day of visit: allergies, medications, problem list, medical history, surgical history, family history, social history, and previous encounter notes.  I, Davy Pique, RMA, am acting as Location manager for Lori Marble, NP.  I have reviewed the above documentation for accuracy and completeness, and I agree with the above. -  ***

## 2021-07-17 ENCOUNTER — Other Ambulatory Visit: Payer: Self-pay | Admitting: Critical Care Medicine

## 2021-07-17 DIAGNOSIS — Z1231 Encounter for screening mammogram for malignant neoplasm of breast: Secondary | ICD-10-CM

## 2021-08-03 ENCOUNTER — Other Ambulatory Visit (INDEPENDENT_AMBULATORY_CARE_PROVIDER_SITE_OTHER): Payer: Self-pay | Admitting: Adult Health

## 2021-08-03 DIAGNOSIS — R7303 Prediabetes: Secondary | ICD-10-CM

## 2021-08-07 ENCOUNTER — Ambulatory Visit (INDEPENDENT_AMBULATORY_CARE_PROVIDER_SITE_OTHER): Payer: Medicare Other | Admitting: Adult Health

## 2021-08-07 ENCOUNTER — Encounter (INDEPENDENT_AMBULATORY_CARE_PROVIDER_SITE_OTHER): Payer: Self-pay | Admitting: Adult Health

## 2021-08-07 VITALS — BP 111/78 | HR 98 | Temp 97.9°F | Ht 62.0 in | Wt 188.0 lb

## 2021-08-07 DIAGNOSIS — Z6837 Body mass index (BMI) 37.0-37.9, adult: Secondary | ICD-10-CM

## 2021-08-07 DIAGNOSIS — Z6834 Body mass index (BMI) 34.0-34.9, adult: Secondary | ICD-10-CM | POA: Diagnosis not present

## 2021-08-07 DIAGNOSIS — E669 Obesity, unspecified: Secondary | ICD-10-CM

## 2021-08-07 DIAGNOSIS — R0602 Shortness of breath: Secondary | ICD-10-CM

## 2021-08-07 DIAGNOSIS — R7303 Prediabetes: Secondary | ICD-10-CM

## 2021-08-07 DIAGNOSIS — E559 Vitamin D deficiency, unspecified: Secondary | ICD-10-CM | POA: Diagnosis not present

## 2021-08-07 MED ORDER — METFORMIN HCL 500 MG PO TABS: ORAL_TABLET | ORAL | 0 refills | Status: DC

## 2021-08-11 NOTE — Progress Notes (Unsigned)
Chief Complaint:   OBESITY Lori Mooney is here to discuss her progress with her obesity treatment plan along with follow-up of her obesity related diagnoses. [name] is on the Category 1 Plan and states she is following her eating plan approximately 80% of the time. Chabeli states she is doing aerobics for 45-60 minutes 1 time per week.  Today's visit was #: 22 Starting weight: 203 lbs Starting date: 01/30/2020 Today's weight: 188 lbs Today's date: 08/07/2021 Total lbs lost to date: 15 Total lbs lost since last in-office visit: 4  Interim History: [name] is now taking metformin 500 mg +250 mg = 750 mg daily.  For ease, she is taking 1-1/2 tablet at breakfast.  She denies GI upset.  She is interested in a GLP-1 therapy.  We discussed risk/benefits at length.  Subjective:   1. Pre-diabetes On 07/01/2021, Lori Mooney's CMP-GFR***.  Blood sugars 98, A1c 5.8, and insulin level 20.4.  I discussed labs with the patient today.  2. Vitamin D deficiency On 07/01/2021, vitamin D level was 50.2, stable. Lori Mooney is on a OTC vitamin D3 1000 units daily.  I discussed labs with the patient today.  3. SOB (shortness of breath) on exertion ***  Assessment/Plan:   1. Pre-diabetes Tiyanna will continue metformin therapy, and we will refill for 1 month.  We will consider GLP-1 therapy and Fall 2023.  - metFORMIN (GLUCOPHAGE) 500 MG tablet; 1 tab with breakfast, 1/2 tab with lunch  Dispense: 60 tablet; Refill: 0  2. Vitamin D deficiency Lori Mooney will continue OTC vitamin D3 1000 units daily.  3. SOB (shortness of breath) on exertion Lori Mooney will continue her Category 1 meal plan.   4. Obesity, current BMI 34.5 Lori Mooney is currently in the action stage of change. As such, her goal is to continue with weight loss efforts. She has agreed to the Category 1 Plan.   Exercise goals: As is.   Behavioral modification strategies: increasing lean protein intake, decreasing simple carbohydrates, meal planning and cooking  strategies, keeping healthy foods in the home, and planning for success.  [name] has agreed to follow-up with our clinic in 4 weeks. She was informed of the importance of frequent follow-up visits to maximize her success with intensive lifestyle modifications for her multiple health conditions.   Objective:   Blood pressure 111/78, pulse 98, temperature 97.9 F (36.6 C), height '5\' 2"'$  (1.575 m), weight 188 lb (85.3 kg), SpO2 97 %. Body mass index is 34.39 kg/m.  General: Cooperative, alert, well developed, in no acute distress. HEENT: Conjunctivae and lids unremarkable. Cardiovascular: Regular rhythm.  Lungs: Normal work of breathing. Neurologic: No focal deficits.   Lab Results  Component Value Date   CREATININE 0.93 07/01/2021   BUN 16 07/01/2021   NA 141 07/01/2021   K 4.5 07/01/2021   CL 103 07/01/2021   CO2 18 (L) 07/01/2021   Lab Results  Component Value Date   ALT 14 07/01/2021   AST 14 07/01/2021   ALKPHOS 62 07/01/2021   BILITOT 0.3 07/01/2021   Lab Results  Component Value Date   HGBA1C 5.8 (H) 07/01/2021   HGBA1C 5.7 (H) 01/28/2021   HGBA1C 5.9 10/22/2020   HGBA1C 5.8 (H) 07/15/2020   HGBA1C 6.1 (H) 04/15/2020   Lab Results  Component Value Date   INSULIN 20.4 07/01/2021   INSULIN 26.6 (H) 01/28/2021   INSULIN 23.6 07/15/2020   INSULIN 20.7 01/30/2020   Lab Results  Component Value Date   TSH 2.590 01/30/2020  Lab Results  Component Value Date   CHOL 168 05/06/2021   HDL 64.00 05/06/2021   LDLCALC 67 05/06/2021   LDLDIRECT 89.0 10/22/2020   TRIG 187.0 (H) 05/06/2021   CHOLHDL 3 05/06/2021   Lab Results  Component Value Date   VD25OH 50.2 07/01/2021   VD25OH 52.0 01/28/2021   VD25OH 45.66 10/24/2020   Lab Results  Component Value Date   WBC 6.0 10/22/2020   HGB 14.9 10/22/2020   HCT 45.0 10/22/2020   MCV 89.6 10/22/2020   PLT 273.0 10/22/2020   No results found for: "IRON", "TIBC", "FERRITIN"  Attestation Statements:   Reviewed  by clinician on day of visit: allergies, medications, problem list, medical history, surgical history, family history, social history, and previous encounter notes.  Time spent on visit including pre-visit chart review and post-visit care and charting was 28 minutes.   Wilhemena Durie, am acting as transcriptionist for Mina Marble, NP.  I have reviewed the above documentation for accuracy and completeness, and I agree with the above. -  ***

## 2021-09-02 ENCOUNTER — Ambulatory Visit
Admission: RE | Admit: 2021-09-02 | Discharge: 2021-09-02 | Disposition: A | Discharge status: Home / Self Care | Payer: Medicare Other | Source: Ambulatory Visit | Attending: Critical Care Medicine | Admitting: Critical Care Medicine

## 2021-09-02 ENCOUNTER — Other Ambulatory Visit: Payer: Self-pay | Admitting: Family Medicine

## 2021-09-02 DIAGNOSIS — N644 Mastodynia: Secondary | ICD-10-CM

## 2021-09-02 DIAGNOSIS — Z1231 Encounter for screening mammogram for malignant neoplasm of breast: Secondary | ICD-10-CM

## 2021-09-03 ENCOUNTER — Encounter (INDEPENDENT_AMBULATORY_CARE_PROVIDER_SITE_OTHER): Payer: Self-pay

## 2021-09-04 ENCOUNTER — Ambulatory Visit (INDEPENDENT_AMBULATORY_CARE_PROVIDER_SITE_OTHER): Payer: Medicare Other | Admitting: Adult Health

## 2021-09-04 ENCOUNTER — Encounter (INDEPENDENT_AMBULATORY_CARE_PROVIDER_SITE_OTHER): Payer: Self-pay | Admitting: Adult Health

## 2021-09-04 VITALS — BP 135/84 | HR 95 | Temp 97.8°F | Ht 62.0 in | Wt 192.0 lb

## 2021-09-04 DIAGNOSIS — Z6835 Body mass index (BMI) 35.0-35.9, adult: Secondary | ICD-10-CM | POA: Diagnosis not present

## 2021-09-04 DIAGNOSIS — R7303 Prediabetes: Secondary | ICD-10-CM | POA: Diagnosis not present

## 2021-09-04 DIAGNOSIS — E669 Obesity, unspecified: Secondary | ICD-10-CM | POA: Diagnosis not present

## 2021-09-11 NOTE — Progress Notes (Signed)
Chief Complaint:   OBESITY Lori Mooney is here to discuss her progress with her obesity treatment plan along with follow-up of her obesity related diagnoses. Lori Mooney is on the Category 1 Plan and states she is following her eating plan approximately 80% of the time. Lori Mooney states she is doing low impact aerobics for 45-60 minutes 2 times per week.  Today's visit was #: 23 Starting weight: 203 lbs Starting date: 01/30/2020 Today's weight: 192 lbs Today's date: 09/04/2021 Total lbs lost to date: 11 Total lbs lost since last in-office visit: 0  Interim History:  Lori Mooney recently celebrated her 47th wedding anniversary at [employer] with her husband. She enjoyed a lovely protein rich meal with her husband.  Subjective:   1. Pre-diabetes Lori Mooney is currently taking metformin 500 mg once- 1 tab with breakfast, 1/2 tablet with lunch.   She denies decrease in polyphagia with current dosing. Again, we discussed risk and benefits of Ozempic therapy.  Assessment/Plan:   1. Pre-diabetes Lori Mooney will remain on metformin with no change in.  She is to call her insurance company and inquire if Ozempic covered for prediabetes.  2. Current BMI 35.1 Lori Mooney is currently in the action stage of change. As such, her goal is to continue with weight loss efforts. She has agreed to the Category 1 Plan.   Exercise goals: Aerobics, increase daily walking.  Behavioral modification strategies: increasing lean protein intake, decreasing simple carbohydrates, increasing vegetables, increasing water intake, meal planning and cooking strategies, keeping healthy foods in the home, and planning for success.  Lori Mooney has agreed to follow-up with our clinic in 4 weeks. She was informed of the importance of frequent follow-up visits to maximize her success with intensive lifestyle modifications for her multiple health conditions.   Objective:   Blood pressure 135/84, pulse 95, temperature 97.8 F (36.6 C), height  '5\' 2"'$  (1.575 m), weight 192 lb (87.1 kg), SpO2 98 %. Body mass index is 35.12 kg/m.  General: Cooperative, alert, well developed, in no acute distress. HEENT: Conjunctivae and lids unremarkable. Cardiovascular: Regular rhythm.  Lungs: Normal work of breathing. Neurologic: No focal deficits.   Lab Results  Component Value Date   CREATININE 0.93 07/01/2021   BUN 16 07/01/2021   NA 141 07/01/2021   K 4.5 07/01/2021   CL 103 07/01/2021   CO2 18 (L) 07/01/2021   Lab Results  Component Value Date   ALT 14 07/01/2021   AST 14 07/01/2021   ALKPHOS 62 07/01/2021   BILITOT 0.3 07/01/2021   Lab Results  Component Value Date   HGBA1C 5.8 (H) 07/01/2021   HGBA1C 5.7 (H) 01/28/2021   HGBA1C 5.9 10/22/2020   HGBA1C 5.8 (H) 07/15/2020   HGBA1C 6.1 (H) 04/15/2020   Lab Results  Component Value Date   INSULIN 20.4 07/01/2021   INSULIN 26.6 (H) 01/28/2021   INSULIN 23.6 07/15/2020   INSULIN 20.7 01/30/2020   Lab Results  Component Value Date   TSH 2.590 01/30/2020   Lab Results  Component Value Date   CHOL 168 05/06/2021   HDL 64.00 05/06/2021   LDLCALC 67 05/06/2021   LDLDIRECT 89.0 10/22/2020   TRIG 187.0 (H) 05/06/2021   CHOLHDL 3 05/06/2021   Lab Results  Component Value Date   VD25OH 50.2 07/01/2021   VD25OH 52.0 01/28/2021   VD25OH 45.66 10/24/2020   Lab Results  Component Value Date   WBC 6.0 10/22/2020   HGB 14.9 10/22/2020   HCT 45.0 10/22/2020   MCV 89.6 10/22/2020  PLT 273.0 10/22/2020   No results found for: "IRON", "TIBC", "FERRITIN"  Attestation Statements:   Reviewed by clinician on day of visit: allergies, medications, problem list, medical history, surgical history, family history, social history, and previous encounter notes.   Lori Mooney, am acting as transcriptionist for Lori Marble, NP.  I have reviewed the above documentation for accuracy and completeness, and I agree with the above. -  Lori Mooney d. Lori Postle, NP-C

## 2021-09-25 ENCOUNTER — Ambulatory Visit
Admission: RE | Admit: 2021-09-25 | Discharge: 2021-09-25 | Disposition: A | Discharge status: Home / Self Care | Payer: Medicare Other | Source: Ambulatory Visit | Attending: Family Medicine | Admitting: Family Medicine

## 2021-09-25 ENCOUNTER — Other Ambulatory Visit: Payer: Self-pay | Admitting: Family Medicine

## 2021-09-25 DIAGNOSIS — N6320 Unspecified lump in the left breast, unspecified quadrant: Secondary | ICD-10-CM

## 2021-09-25 DIAGNOSIS — N644 Mastodynia: Secondary | ICD-10-CM

## 2021-09-30 ENCOUNTER — Other Ambulatory Visit: Payer: Self-pay | Admitting: Family Medicine

## 2021-09-30 ENCOUNTER — Ambulatory Visit
Admission: RE | Admit: 2021-09-30 | Discharge: 2021-09-30 | Disposition: A | Discharge status: Home / Self Care | Payer: Medicare Other | Source: Ambulatory Visit | Attending: Family Medicine | Admitting: Family Medicine

## 2021-09-30 DIAGNOSIS — N6320 Unspecified lump in the left breast, unspecified quadrant: Secondary | ICD-10-CM

## 2021-09-30 DIAGNOSIS — D242 Benign neoplasm of left breast: Secondary | ICD-10-CM | POA: Diagnosis not present

## 2021-09-30 DIAGNOSIS — N6325 Unspecified lump in the left breast, overlapping quadrants: Secondary | ICD-10-CM | POA: Diagnosis not present

## 2021-10-02 ENCOUNTER — Encounter (INDEPENDENT_AMBULATORY_CARE_PROVIDER_SITE_OTHER): Payer: Self-pay | Admitting: Adult Health

## 2021-10-02 ENCOUNTER — Ambulatory Visit (INDEPENDENT_AMBULATORY_CARE_PROVIDER_SITE_OTHER): Payer: Medicare Other | Admitting: Adult Health

## 2021-10-02 VITALS — BP 133/82 | HR 84 | Temp 97.9°F | Ht 62.0 in | Wt 190.0 lb

## 2021-10-02 DIAGNOSIS — R7303 Prediabetes: Secondary | ICD-10-CM

## 2021-10-02 DIAGNOSIS — Z6834 Body mass index (BMI) 34.0-34.9, adult: Secondary | ICD-10-CM

## 2021-10-02 DIAGNOSIS — E669 Obesity, unspecified: Secondary | ICD-10-CM

## 2021-10-02 MED ORDER — METFORMIN HCL 500 MG PO TABS: ORAL_TABLET | ORAL | 0 refills | Status: DC

## 2021-10-04 NOTE — Progress Notes (Unsigned)
Chief Complaint:   OBESITY Lori Mooney is here to discuss her progress with her obesity treatment plan along with follow-up of her obesity related diagnoses. Lori Mooney is on the Category 1 Plan and states she is following her eating plan approximately 80% of the time. Lori Mooney states she is low impact aerobics 45-60 minutes 2 times per week.  Today's visit was #: 24 Starting weight: 203 lbs Starting date: 01/30/2020 Today's weight: 190 lbs Today's date: 10/02/2021 Total lbs lost to date: 13 lbs Total lbs lost since last in-office visit: 2 lbs  Interim History: Discussed various life stressors, would like to extend out follow up OVs to 12 weeks.   Subjective:   1. Pre-diabetes Lab Results  Component Value Date   HGBA1C 5.8 (H) 07/01/2021   HGBA1C 5.7 (H) 01/28/2021   HGBA1C 5.9 10/22/2020   07/01/21 CMP GFR- 64 Currently on metFORMIN (GLUCOPHAGE) 500 MG tablet; 1 tab with breakfast, 1/2 tab with lunch  She denies GI upset.  Assessment/Plan:   1. Pre-diabetes Refill - metFORMIN (GLUCOPHAGE) 500 MG tablet; 1 tab with breakfast, 1/2 tab with lunch  Dispense: 180 tablet; Refill: 0  2. Obesity, current BMI 34.8 Lori Mooney is currently in the action stage of change. As such, her goal is to continue with weight loss efforts. She has agreed to the Category 1 Plan.   Exercise goals:  as is.  Behavioral modification strategies: increasing lean protein intake, decreasing simple carbohydrates, meal planning and cooking strategies, keeping healthy foods in the home, and planning for success.  Lori Mooney has agreed to follow-up with our clinic in 12 weeks. She was informed of the importance of frequent follow-up visits to maximize her success with intensive lifestyle modifications for her multiple health conditions.   Objective:   Blood pressure 133/82, pulse 84, temperature 97.9 F (36.6 C), height '5\' 2"'$  (1.575 m), weight 190 lb (86.2 kg), SpO2 96 %. Body mass index is 34.75 kg/m.  General:  Cooperative, alert, well developed, in no acute distress. HEENT: Conjunctivae and lids unremarkable. Cardiovascular: Regular rhythm.  Lungs: Normal work of breathing. Neurologic: No focal deficits.   Lab Results  Component Value Date   CREATININE 0.93 07/01/2021   BUN 16 07/01/2021   NA 141 07/01/2021   K 4.5 07/01/2021   CL 103 07/01/2021   CO2 18 (L) 07/01/2021   Lab Results  Component Value Date   ALT 14 07/01/2021   AST 14 07/01/2021   ALKPHOS 62 07/01/2021   BILITOT 0.3 07/01/2021   Lab Results  Component Value Date   HGBA1C 5.8 (H) 07/01/2021   HGBA1C 5.7 (H) 01/28/2021   HGBA1C 5.9 10/22/2020   HGBA1C 5.8 (H) 07/15/2020   HGBA1C 6.1 (H) 04/15/2020   Lab Results  Component Value Date   INSULIN 20.4 07/01/2021   INSULIN 26.6 (H) 01/28/2021   INSULIN 23.6 07/15/2020   INSULIN 20.7 01/30/2020   Lab Results  Component Value Date   TSH 2.590 01/30/2020   Lab Results  Component Value Date   CHOL 168 05/06/2021   HDL 64.00 05/06/2021   LDLCALC 67 05/06/2021   LDLDIRECT 89.0 10/22/2020   TRIG 187.0 (H) 05/06/2021   CHOLHDL 3 05/06/2021   Lab Results  Component Value Date   VD25OH 50.2 07/01/2021   VD25OH 52.0 01/28/2021   VD25OH 45.66 10/24/2020   Lab Results  Component Value Date   WBC 6.0 10/22/2020   HGB 14.9 10/22/2020   HCT 45.0 10/22/2020   MCV 89.6 10/22/2020  PLT 273.0 10/22/2020   No results found for: "IRON", "TIBC", "FERRITIN"  Obesity Behavioral Intervention:   Approximately 15 minutes were spent on the discussion below.  ASK: We discussed the diagnosis of obesity with Lori Mooney today and Lori Mooney agreed to give [place] permission to discuss obesity behavioral modification therapy today.  ASSESS: Lori Mooney has the diagnosis of obesity and her BMI today is 34.8. Lori Mooney is in the action stage of change.   ADVISE: Lori Mooney was educated on the multiple health risks of obesity as well as the benefit of weight loss to improve her health. She was  advised of the need for long term treatment and the importance of lifestyle modifications to improve her current health and to decrease her risk of future health problems.  AGREE: Multiple dietary modification options and treatment options were discussed and Lori Mooney agreed to follow the recommendations documented in the above note.  ARRANGE: Lori Mooney was educated on the importance of frequent visits to treat obesity as outlined per CMS and USPSTF guidelines and agreed to schedule her next follow up appointment today.  Attestation Statements:   Reviewed by clinician on day of visit: allergies, medications, problem list, medical history, surgical history, family history, social history, and previous encounter notes.  I, Davy Pique, RMA, am acting as Location manager for Mina Marble, NP.  I have reviewed the above documentation for accuracy and completeness, and I agree with the above. -  Katy d. Danford, NP-C

## 2021-10-09 DIAGNOSIS — H40003 Preglaucoma, unspecified, bilateral: Secondary | ICD-10-CM | POA: Diagnosis not present

## 2021-10-14 ENCOUNTER — Ambulatory Visit
Admission: RE | Admit: 2021-10-14 | Discharge: 2021-10-14 | Disposition: A | Discharge status: Home / Self Care | Payer: Medicare Other | Source: Ambulatory Visit | Attending: Family Medicine | Admitting: Family Medicine

## 2021-10-14 DIAGNOSIS — N6320 Unspecified lump in the left breast, unspecified quadrant: Secondary | ICD-10-CM

## 2021-10-14 DIAGNOSIS — R928 Other abnormal and inconclusive findings on diagnostic imaging of breast: Secondary | ICD-10-CM | POA: Diagnosis not present

## 2021-10-14 DIAGNOSIS — N6012 Diffuse cystic mastopathy of left breast: Secondary | ICD-10-CM | POA: Diagnosis not present

## 2021-10-21 ENCOUNTER — Ambulatory Visit: Payer: Medicare Other

## 2021-10-23 ENCOUNTER — Ambulatory Visit (INDEPENDENT_AMBULATORY_CARE_PROVIDER_SITE_OTHER): Payer: Medicare Other | Admitting: Family Medicine

## 2021-10-23 DIAGNOSIS — Z23 Encounter for immunization: Secondary | ICD-10-CM | POA: Diagnosis not present

## 2021-10-29 ENCOUNTER — Ambulatory Visit: Payer: Medicare Other

## 2021-10-29 NOTE — Progress Notes (Unsigned)
Pt presented to office for flu shot, pt tolerated injection

## 2021-11-13 ENCOUNTER — Encounter: Payer: Self-pay | Admitting: Family Medicine

## 2021-11-13 ENCOUNTER — Ambulatory Visit (INDEPENDENT_AMBULATORY_CARE_PROVIDER_SITE_OTHER): Payer: Medicare Other | Admitting: Family Medicine

## 2021-11-13 VITALS — BP 126/74 | HR 74 | Temp 98.3°F | Ht 62.0 in | Wt 198.8 lb

## 2021-11-13 DIAGNOSIS — R7303 Prediabetes: Secondary | ICD-10-CM | POA: Diagnosis not present

## 2021-11-13 DIAGNOSIS — I10 Essential (primary) hypertension: Secondary | ICD-10-CM

## 2021-11-13 DIAGNOSIS — E7800 Pure hypercholesterolemia, unspecified: Secondary | ICD-10-CM | POA: Diagnosis not present

## 2021-11-13 LAB — COMPREHENSIVE METABOLIC PANEL
ALT: 17 U/L (ref 0–35)
AST: 20 U/L (ref 0–37)
Albumin: 4.7 g/dL (ref 3.5–5.2)
Alkaline Phosphatase: 63 U/L (ref 39–117)
BUN: 16 mg/dL (ref 6–23)
CO2: 28 mEq/L (ref 19–32)
Calcium: 10.3 mg/dL (ref 8.4–10.5)
Chloride: 100 mEq/L (ref 96–112)
Creatinine, Ser: 1.09 mg/dL (ref 0.40–1.20)
GFR: 49.52 mL/min — ABNORMAL LOW (ref >60.00)
Glucose, Bld: 91 mg/dL (ref 70–99)
Potassium: 3.8 mEq/L (ref 3.5–5.1)
Sodium: 136 mEq/L (ref 135–145)
Total Bilirubin: 0.5 mg/dL (ref 0.2–1.2)
Total Protein: 7.4 g/dL (ref 6.0–8.3)

## 2021-11-13 LAB — LIPID PANEL
Cholesterol: 195 mg/dL (ref 0–200)
HDL: 64.7 mg/dL (ref >39.00)
NonHDL: 129.82
Total CHOL/HDL Ratio: 3
Triglycerides: 241 mg/dL — ABNORMAL HIGH (ref 0.0–149.0)
VLDL: 48.2 mg/dL — ABNORMAL HIGH (ref 0.0–40.0)

## 2021-11-13 LAB — HEMOGLOBIN A1C: Hgb A1c MFr Bld: 6.2 % (ref 4.6–6.5)

## 2021-11-13 LAB — LDL CHOLESTEROL, DIRECT: Direct LDL: 96 mg/dL

## 2021-11-13 NOTE — Progress Notes (Signed)
Subjective:  Patient ID: Lori Mooney, female    DOB: 1945-09-04  Age: 76 y.o. MRN: [id number]  CC:  Chief Complaint  Patient presents with   Hyperlipidemia   Hypertension   Prediabetes    HPI Lori Mooney presents for  Follow up.  Had medicare wellness exam 02/06/21.   Prediabetes: Followed at healthy weight and wellness for obesity with last visit September 7.  Note reviewed, following eating plan 80% of time and low impact aerobics 45 to 60 minutes 2 days/week.  Starting weight 203, weight at last visit 190.  Takes metformin 500 mg every morning, 250 mg with lunch without gastrointestinal upset.  Continued on same dose at her September visit. Follow up in December planned.  Taking metformin '500mg'$  in am - forgets lunchtime dose. No GI side effects Some weight gain after illness earlier this year and with breast abnormality concerns.  Lab Results  Component Value Date   HGBA1C 5.8 (H) 07/01/2021   Wt Readings from Last 3 Encounters:  11/13/21 198 lb 12.8 oz (90.2 kg)  10/02/21 190 lb (86.2 kg)  09/04/21 192 lb (87.1 kg)   Hyperlipidemia: Lipitor 20 mg daily.  Has had some elevated triglycerides previously, up to 218 in September 2022.  Improved earlier this year.  No new myalgias or side effects of Lipitor. Lab Results  Component Value Date   CHOL 168 05/06/2021   HDL 64.00 05/06/2021   LDLCALC 67 05/06/2021   LDLDIRECT 89.0 10/22/2020   TRIG 187.0 (H) 05/06/2021   CHOLHDL 3 05/06/2021   Lab Results  Component Value Date   ALT 14 07/01/2021   AST 14 07/01/2021   ALKPHOS 62 07/01/2021   BILITOT 0.3 07/01/2021   Hypertension: Lisinopril HCTZ 20/12.5 mg -had decreased to half pill at last visit due to some lower home readings.  Adjusted to 10/12.5 mg commendation dose. Returned to '20mg'$  dose past week as blood pressures were increasing - 130's. No recent lows. About a month ago readings were a little lower.   BP Readings from Last 3 Encounters:  11/13/21  126/74  10/02/21 133/82  09/04/21 135/84   Lab Results  Component Value Date   CREATININE 0.93 07/01/2021   Back pain, spinal stenosis, sciatica Discussed at her April visit.  Referred to spine specialist.  It appears she was seen at [facility] with Dr. Gladstone Lighter on April 19th.  Note reviewed.  Suspected significant spinal stenosis.  Option to take prednisone Dosepak.  Meloxicam 7.5 mg.  Possible need for MRI some time.  Did take prednisone - helped.  Rare meloxicam - about once per week.   HM: Covid booster - declines  at this time. Recent infection in September. Shingrix recommend at her pharmacy.   History Patient Active Problem List   Diagnosis Date Noted   SOB (shortness of breath) on exertion 07/06/2021   Osteopenia 05/09/2021   Status post colonoscopy 09/26/2020   Insulin resistance 02/28/2020   Other hyperlipidemia 02/14/2020   Nonspecific abnormal electrocardiogram (ECG) 02/14/2020   Essential hypertension 01/30/2020   Hypertriglyceridemia 01/30/2020   Other fatigue 01/30/2020   Prediabetes 01/30/2020   Osteoarthritis 01/30/2020   Vitamin D deficiency 01/30/2020   SOBOE (shortness of breath on exertion) 01/30/2020   H/O total knee replacement, left 08/24/2018   Osteoarthritis of spine with radiculopathy, lumbar region 06/25/2017   Back pain with sciatica 06/25/2017   Spinal stenosis of lumbar region without neurogenic claudication 06/25/2017   Sciatica of right side 06/19/2017   GERD (  gastroesophageal reflux disease) 09/19/2013   Vertigo 03/02/2012   Hypercholesterolemia 10/08/2010   Benign hypertensive heart disease without heart failure 10/08/2010   Class 2 severe obesity with serious comorbidity and body mass index (BMI) of 37.0 to 37.9 in adult [facility]) 10/08/2010   Past Medical History:  Diagnosis Date   Allergy    Arthritis    history of arthritis in the fingers   Cataract    Constipation    DDD (degenerative disc disease), lumbar    Exogenous obesity     GERD (gastroesophageal reflux disease)    Heartburn    Hypercholesterolemia    Hyperlipidemia    hypercholesterolemia   Hypertension    Hypertriglyceridemia    Osteoarthritis    Palpitations    Prediabetes    SOB (shortness of breath) on exertion    Swelling of both lower extremities    Past Surgical History:  Procedure Laterality Date   CATARACT EXTRACTION W/ INTRAOCULAR LENS IMPLANT Left    DILITATION & CURRETTAGE/HYSTROSCOPY WITH VERSAPOINT RESECTION N/A 08/25/2012   Procedure: DILATATION & CURETTAGE/HYSTEROSCOPY WITH VERSAPOINT RESECTION;  Surgeon: Princess Bruins, MD;  Location: [facility];  Service: Gynecology;  Laterality: N/A;   EYE SURGERY     LUMBAR EPIDURAL INJECTION  08/04/2017   TONSILLECTOMY     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   Procedure: TOTAL KNEE ARTHROPLASTY;  Surgeon: [name], MD;  Location: [facility];  Service: Orthopedics;  Laterality: Left;  147mn   TUBAL LIGATION     Allergies  Allergen Reactions   Shellfish Allergy Hives   Strawberry Extract Hives   Prior to Admission medications   Medication Sig Start Date End Date Taking? Authorizing Provider  acetaminophen (TYLENOL) 325 MG tablet Take 650 mg by mouth every 6 (six) hours as needed.   Yes [provider]  atorvastatin (LIPITOR) 20 MG tablet Take 1 tablet (20 mg total) by mouth daily. 05/08/21  Yes GWendie Agreste MD  Biotin w/ Vitamins C & E (HAIR/SKIN/NAILS PO) Take by mouth. Natures Bounty   Yes [provider]  Cholecalciferol (VITAMIN D-3) 1000 UNITS CAPS Take 1,000 Units by mouth daily.   Yes [provider]  famotidine (PEPCID) 10 MG tablet Take 10 mg by mouth at bedtime.    Yes [provider]  lisinopril-hydrochlorothiazide (ZESTORETIC) 10-12.5 MG tablet Take 1 tablet by mouth daily. 05/08/21  Yes GWendie Agreste MD  loratadine (CLARITIN) 10 MG tablet Take 10 mg by mouth daily as needed for allergies.    Yes [provider]  magnesium  30 MG tablet Take 250 mg by mouth daily.    Yes [provider]  meloxicam (MOBIC) 7.5 MG tablet Take 1 tablet (7.5 mg total) by mouth daily as needed for pain. Patient taking differently: Take 7.5 mg by mouth daily as needed for pain. PRN 04/19/20  Yes GWendie Agreste MD  metFORMIN (GLUCOPHAGE) 500 MG tablet 1 tab with breakfast, 1/2 tab with lunch 10/02/21  Yes Danford, KValetta FullerD, NP  Calcium Citrate 150 MG CAPS Take by mouth.    [provider]   Social History   Socioeconomic History   Marital status: Married    Spouse name: Lori Mooney  Number of children: 2   Years of education: Not on file   Highest education level: Not on file  Occupational History   Occupation: Retired RTherapist, sports Tobacco Use   Smoking status: Never   Smokeless tobacco: Never  Vaping Use   Vaping Use:  Never used  Substance and Sexual Activity   Alcohol use: Yes    Alcohol/week: 1.0 - 2.0 standard drink of alcohol    Types: 1 - 2 Standard drinks or equivalent per week   Drug use: No   Sexual activity: Yes  Other Topics Concern   Not on file  Social History Narrative   Married. Education: [employer].   Social Determinants of Health   Financial Resource Strain: Low Risk  (02/06/2021)   Overall Financial Resource Strain (CARDIA)    Difficulty of Paying Living Expenses: Not hard at all  Food Insecurity: No Food Insecurity (02/06/2021)   Hunger Vital Sign    Worried About Running Out of Food in the Last Year: Never true    Ran Out of Food in the Last Year: Never true  Transportation Needs: No Transportation Needs (02/06/2021)   PRAPARE - Hydrologist (Medical): No    Lack of Transportation (Non-Medical): No  Physical Activity: Sufficiently Active (02/06/2021)   Exercise Vital Sign    Days of Exercise per Week: 3 days    Minutes of Exercise per Session: 60 min  Stress: No Stress Concern Present (02/06/2021)   [place]    Feeling of Stress : Not at all  Social Connections: Moderately Integrated (02/06/2021)   Social Connection and Isolation Panel [NHANES]    Frequency of Communication with Friends and Family: Twice a week    Frequency of Social Gatherings with Friends and Family: Twice a week    Attends Religious Services: 1 to 4 times per year    Active Member of [employer] or Organizations: No    Attends Archivist Meetings: Never    Marital Status: Married  Human resources officer Violence: Not At Risk (02/06/2021)   Humiliation, Afraid, Rape, and Kick questionnaire    Fear of Current or Ex-Partner: No    Emotionally Abused: No    Physically Abused: No    Sexually Abused: No    Review of Systems  Constitutional:  Negative for fatigue and unexpected weight change.  Respiratory:  Negative for chest tightness and shortness of breath.   Cardiovascular:  Negative for chest pain, palpitations and leg swelling.  Gastrointestinal:  Negative for abdominal pain and blood in stool.  Neurological:  Negative for dizziness, syncope, light-headedness and headaches.     Objective:   Vitals:   11/13/21 0753  BP: 126/74  Pulse: 74  Temp: 98.3 F (36.8 C)  TempSrc: Oral  SpO2: 96%  Weight: 198 lb 12.8 oz (90.2 kg)  Height: '5\' 2"'$  (1.575 m)     Physical Exam Vitals reviewed.  Constitutional:      Appearance: Normal appearance. She is well-developed.  HENT:     Head: Normocephalic and atraumatic.  Eyes:     Conjunctiva/sclera: Conjunctivae normal.     Pupils: Pupils are equal, round, and reactive to light.  Neck:     Vascular: No carotid bruit.  Cardiovascular:     Rate and Rhythm: Normal rate and regular rhythm.     Heart sounds: Normal heart sounds.  Pulmonary:     Effort: Pulmonary effort is normal.     Breath sounds: Normal breath sounds.  Abdominal:     Palpations: Abdomen is soft. There is no pulsatile mass.     Tenderness: There is no abdominal tenderness.   Musculoskeletal:     Right lower leg: No edema.     Left lower  leg: No edema.  Skin:    General: Skin is warm and dry.  Neurological:     Mental Status: She is alert and oriented to person, place, and time.  Psychiatric:        Mood and Affect: Mood normal.        Behavior: Behavior normal.     Assessment & Plan:  Lori Mooney is a 76 y.o. female . Pre-diabetes - Plan: Hemoglobin A1c  -Check updated A1c.  Continue follow-up with healthy weight and wellness.  Plans increase activity/exercise for improved weight.  Essential hypertension - Plan: Comprehensive metabolic panel  -Stable in office, continue 20/12.5 mg combo for now, has meds at home.  Monitor home readings and if lower readings again, especially as weight improves would recommend decreasing to the 10/12.5 mg combo pill.  She will call me when refill needed of either.  Check labs as above.  Hypercholesterolemia - Plan: Comprehensive metabolic panel, Lipid panel  -Tolerating current dose Lipitor, check labs.  Health maintenance, not due for COVID booster yet.  Shingles vaccine recommended at pharmacy with potential side effects discussed.  No orders of the defined types were placed in this encounter.  Patient Instructions  If blood pressure remains in the 120s over 70s - okay to remain on the same dose of blood pressure medication, the 20 mg dose for now.  If you notice the blood pressure starts to decrease again, return to the 10 mg combo dose and let me know when you need a refill for either.  As exercise improves and weight improves, I suspect you will be able to change to lower dose again.   No other med changes at this time.  Take care!        Signed,   Merri Ray, MD [place], [facility] 11/13/21 8:39 AM

## 2021-11-13 NOTE — Patient Instructions (Addendum)
If blood pressure remains in the 120s over 70s - okay to remain on the same dose of blood pressure medication, the 20 mg dose for now.  If you notice the blood pressure starts to decrease again, return to the 10 mg combo dose and let me know when you need a refill for either.  As exercise improves and weight improves, I suspect you will be able to change to lower dose again.   No other med changes at this time.  Take care!

## 2021-12-03 ENCOUNTER — Other Ambulatory Visit: Payer: Self-pay | Admitting: Family Medicine

## 2021-12-03 DIAGNOSIS — M5432 Sciatica, left side: Secondary | ICD-10-CM

## 2021-12-03 DIAGNOSIS — M5136 Other intervertebral disc degeneration, lumbar region: Secondary | ICD-10-CM

## 2021-12-03 NOTE — Telephone Encounter (Signed)
Is this ok to refill ? Last Refill was 03/2020 pt was seen in  October

## 2021-12-03 NOTE — Telephone Encounter (Signed)
Meloxicam discussed in October, refill ordered.

## 2021-12-05 ENCOUNTER — Other Ambulatory Visit: Payer: Self-pay | Admitting: Family Medicine

## 2021-12-05 DIAGNOSIS — I10 Essential (primary) hypertension: Secondary | ICD-10-CM

## 2021-12-30 ENCOUNTER — Ambulatory Visit (INDEPENDENT_AMBULATORY_CARE_PROVIDER_SITE_OTHER): Payer: Medicare Other | Admitting: Physician Assistant

## 2021-12-30 ENCOUNTER — Encounter (INDEPENDENT_AMBULATORY_CARE_PROVIDER_SITE_OTHER): Payer: Self-pay | Admitting: Physician Assistant

## 2021-12-30 VITALS — BP 135/76 | HR 90 | Temp 98.0°F | Ht 62.0 in | Wt 191.0 lb

## 2021-12-30 DIAGNOSIS — R7303 Prediabetes: Secondary | ICD-10-CM | POA: Diagnosis not present

## 2021-12-30 DIAGNOSIS — E669 Obesity, unspecified: Secondary | ICD-10-CM

## 2021-12-30 DIAGNOSIS — E7800 Pure hypercholesterolemia, unspecified: Secondary | ICD-10-CM

## 2021-12-30 DIAGNOSIS — Z6835 Body mass index (BMI) 35.0-35.9, adult: Secondary | ICD-10-CM | POA: Diagnosis not present

## 2022-01-12 NOTE — Progress Notes (Unsigned)
Chief Complaint:   OBESITY Lori Mooney is here to discuss her progress with her obesity treatment plan along with follow-up of her obesity related diagnoses. [name] is on the Category 1 Plan and states she is following her eating plan approximately 70% of the time. January states she is doing Aerobics 45 minutes 2 times per week.  Today's visit was #: 25 Starting weight: 203 lbs Starting date: 01/30/2020 Today's weight: 191 lbs Today's date: 12/29/2021 Total lbs lost to date: 12 lbs Total lbs lost since last in-office visit: 0  Interim History: Lori Mooney is struggling to focus on eating plan right now- Dealing with moms death.  Recent visit to see her father who is 46!  Reports some cravings, in the late afternoon while preparing for dinner.  Discussed change to journaling again with my fitness pal.  Subjective:   1. Pre-diabetes Labs discussed during visit today. A1c at 6.2 on 11/13/2021/insulin at 20.4 on 07/01/21.  Discussed taking metformin 500 mg every a.m. and been trying to take 250 mg at least 4 hours later at lunchtime to try to help decrease late afternoon hunger/snacking.  2. Hypercholesterolemia Labs discussed during visit today.  Taking Lipitor with no side effects.  Reviewed labs obtained by PCP.  HDL/LDCT at goal, trig still increased.  Assessment/Plan:   1. Pre-diabetes Continue metformin intake at lunch as well and has continued with eating plan and exercise.  Will follow-up with labs in 3 months.  2. Hypercholesterolemia Continue Lipitor, decrease simple carbs with eating plan and exercise.  Will follow-up with labs in 3 months.  3. Obesity, current BMI 35.0 Lori Mooney is currently in the action stage of change. As such, her goal is to continue with weight loss efforts. She has agreed to the Category 1 Plan and keeping a food journal and adhering to recommended goals of 1000-1100 calories and 80+ grams of protein daily.   Exercise goals: As is. Chair Yoga.  Behavioral  modification strategies: increasing lean protein intake, decreasing simple carbohydrates, meal planning and cooking strategies, and keeping a strict food journal.  [name] has agreed to follow-up with our clinic in 12 weeks. She was informed of the importance of frequent follow-up visits to maximize her success with intensive lifestyle modifications for her multiple health conditions.   Objective:   Blood pressure 135/76, pulse 90, temperature 98 F (36.7 C), height '5\' 2"'$  (1.575 m), weight 191 lb (86.6 kg), SpO2 99 %. Body mass index is 34.93 kg/m.  General: Cooperative, alert, well developed, in no acute distress. HEENT: Conjunctivae and lids unremarkable. Cardiovascular: Regular rhythm.  Lungs: Normal work of breathing. Neurologic: No focal deficits.   Lab Results  Component Value Date   CREATININE 1.09 11/13/2021   BUN 16 11/13/2021   NA 136 11/13/2021   K 3.8 11/13/2021   CL 100 11/13/2021   CO2 28 11/13/2021   Lab Results  Component Value Date   ALT 17 11/13/2021   AST 20 11/13/2021   ALKPHOS 63 11/13/2021   BILITOT 0.5 11/13/2021   Lab Results  Component Value Date   HGBA1C 6.2 11/13/2021   HGBA1C 5.8 (H) 07/01/2021   HGBA1C 5.7 (H) 01/28/2021   HGBA1C 5.9 10/22/2020   HGBA1C 5.8 (H) 07/15/2020   Lab Results  Component Value Date   INSULIN 20.4 07/01/2021   INSULIN 26.6 (H) 01/28/2021   INSULIN 23.6 07/15/2020   INSULIN 20.7 01/30/2020   Lab Results  Component Value Date   TSH 2.590 01/30/2020   Lab  Results  Component Value Date   CHOL 195 11/13/2021   HDL 64.70 11/13/2021   LDLCALC 67 05/06/2021   LDLDIRECT 96.0 11/13/2021   TRIG 241.0 (H) 11/13/2021   CHOLHDL 3 11/13/2021   Lab Results  Component Value Date   VD25OH 50.2 07/01/2021   VD25OH 52.0 01/28/2021   VD25OH 45.66 10/24/2020   Lab Results  Component Value Date   WBC 6.0 10/22/2020   HGB 14.9 10/22/2020   HCT 45.0 10/22/2020   MCV 89.6 10/22/2020   PLT 273.0 10/22/2020   No  results found for: "IRON", "TIBC", "FERRITIN"  Attestation Statements:   Reviewed by clinician on day of visit: allergies, medications, problem list, medical history, surgical history, family history, social history, and previous encounter notes.  I, Brendell Tyus, am acting as transcriptionist for [employer], PA.  I have reviewed the above documentation for accuracy and completeness, and I agree with the above. -  ***

## 2022-03-17 ENCOUNTER — Ambulatory Visit (INDEPENDENT_AMBULATORY_CARE_PROVIDER_SITE_OTHER): Payer: Medicare Other

## 2022-03-17 VITALS — Ht 62.0 in | Wt 195.0 lb

## 2022-03-17 DIAGNOSIS — Z0000 Encounter for general adult medical examination without abnormal findings: Secondary | ICD-10-CM

## 2022-03-17 NOTE — Progress Notes (Signed)
Subjective:   Lori Mooney is a 78 y.o. female who presents for Medicare Annual (Subsequent) preventive examination.  Review of Systems    Virtual Visit via Telephone Note  I connected with  Lori Mooney on 03/17/22 at  8:30 AM EST by telephone and verified that I am speaking with the correct person using two identifiers.  Location: Patient:Home Provider: Office Persons participating in the virtual visit: Lori Mooney   I discussed the limitations, risks, security and privacy concerns of performing an evaluation and management service by telephone and the availability of in person appointments. The patient expressed understanding and agreed to proceed.  Interactive audio and video telecommunications were attempted between this nurse and patient, however failed, due to patient having technical difficulties OR patient did not have access to video capability.  We continued and completed visit with audio only.  Some vital signs may be absent or patient reported.   Lori Peaches, LPN  Cardiac Risk Factors include: advanced age (>34mn, >>44women);hypertension;obesity (BMI >30kg/m2)     Objective:    Today's Vitals   03/17/22 0839  Weight: 195 lb (88.5 kg)  Height: 5' 2"$  (1.575 m)   Body mass index is 35.67 kg/m.     03/17/2022    8:50 AM 02/06/2021   11:25 AM 01/11/2020   10:04 AM 08/24/2018    5:44 AM 08/23/2018   10:19 AM 10/19/2017    2:18 PM 07/20/2017    1:38 PM  Advanced Directives  Does Patient Have a Medical Advance Directive? Yes Yes Yes Yes Yes Yes Yes  Type of AParamedicof ALake DeltaLiving will HAdrianLiving will Living will HHarbineLiving will HHardinLiving will  [place] Does patient want to make changes to medical advance directive?    No - Patient declined   No - Patient declined  Copy of HFort Huntin  Chart? No - copy requested No - copy requested     No - copy requested    Current Medications (verified) Outpatient Encounter Medications as of 03/17/2022  Medication Sig   acetaminophen (TYLENOL) 325 MG tablet Take 650 mg by mouth every 6 (six) hours as needed.   atorvastatin (LIPITOR) 20 MG tablet Take 1 tablet (20 mg total) by mouth daily.   Biotin w/ Vitamins C & E (HAIR/SKIN/NAILS PO) Take by mouth. Natures Bounty   Cholecalciferol (VITAMIN D-3) 1000 UNITS CAPS Take 1,000 Units by mouth daily.   famotidine (PEPCID) 10 MG tablet Take 10 mg by mouth at bedtime.    lisinopril-hydrochlorothiazide (ZESTORETIC) 10-12.5 MG tablet Take 1 tablet by mouth daily.   loratadine (CLARITIN) 10 MG tablet Take 10 mg by mouth daily as needed for allergies.    magnesium 30 MG tablet Take 250 mg by mouth daily.    meloxicam (MOBIC) 7.5 MG tablet TAKE 1 TABLET BY MOUTH ONCE DAILY AS NEEDED FOR PAIN   metFORMIN (GLUCOPHAGE) 500 MG tablet 1 tab with breakfast, 1/2 tab with lunch   [DISCONTINUED] Calcium Citrate 150 MG CAPS Take by mouth.   No facility-administered encounter medications on file as of 03/17/2022.    Allergies (verified) Shellfish allergy and Strawberry extract   History: Past Medical History:  Diagnosis Date   Allergy    Arthritis    history of arthritis in the fingers   Cataract    Constipation    DDD (degenerative disc disease), lumbar  Exogenous obesity    GERD (gastroesophageal reflux disease)    Heartburn    Hypercholesterolemia    Hyperlipidemia    hypercholesterolemia   Hypertension    Hypertriglyceridemia    Osteoarthritis    Palpitations    Prediabetes    SOB (shortness of breath) on exertion    Swelling of both lower extremities    Past Surgical History:  Procedure Laterality Date   CATARACT EXTRACTION W/ INTRAOCULAR LENS IMPLANT Left    DILITATION & CURRETTAGE/HYSTROSCOPY WITH VERSAPOINT RESECTION N/A 08/25/2012   Procedure: DILATATION &  CURETTAGE/HYSTEROSCOPY WITH VERSAPOINT RESECTION;  Surgeon: Princess Bruins, MD;  Location: [facility];  Service: Gynecology;  Laterality: N/A;   EYE SURGERY     LUMBAR EPIDURAL INJECTION  08/04/2017   TONSILLECTOMY     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   Procedure: TOTAL KNEE ARTHROPLASTY;  Surgeon: [name], MD;  Location: [facility];  Service: Orthopedics;  Laterality: Left;  173mn   TUBAL LIGATION     Family History  Problem Relation Age of Onset   Hyperlipidemia Mother    Heart disease Mother    Hypertension Mother    Hypertension Father    Hyperlipidemia Father    Thyroid disease Father    Hyperlipidemia Sister    Hypertension Sister    Social History   Socioeconomic History   Marital status: Married    Spouse name: Lori Mooney  Number of children: 2   Years of education: Not on file   Highest education level: Not on file  Occupational History   Occupation: Retired RTherapist, sports Tobacco Use   Smoking status: Never   Smokeless tobacco: Never  Vaping Use   Vaping Use: Never used  Substance and Sexual Activity   Alcohol use: Yes    Alcohol/week: 1.0 - 2.0 standard drink of alcohol    Types: 1 - 2 Standard drinks or equivalent per week   Drug use: No   Sexual activity: Yes  Other Topics Concern   Not on file  Social History Narrative   Married. Education: [employer]   Social Determinants of Health   Financial Resource Strain: Low Risk  (03/17/2022)   Overall Financial Resource Strain (CARDIA)    Difficulty of Paying Living Expenses: Not hard at all  Food Insecurity: No Food Insecurity (03/17/2022)   Hunger Vital Sign    Worried About Running Out of Food in the Last Year: Never true    Ran Out of Food in the Last Year: Never true  Transportation Needs: No Transportation Needs (03/17/2022)   PRAPARE - THydrologist(Medical): No    Lack of Transportation (Non-Medical): No  Physical Activity: Sufficiently Active (03/17/2022)   Exercise Vital  Sign    Days of Exercise per Week: 3 days    Minutes of Exercise per Session: 50 min  Stress: No Stress Concern Present (03/17/2022)   FH[place]   Feeling of Stress : Not at all  Social Connections: [place](03/17/2022)   Social Connection and Isolation Panel [NHANES]    Frequency of Communication with Friends and Family: More than three times a week    Frequency of Social Gatherings with Friends and Family: More than three times a week    Attends Religious Services: More than 4 times per year    Active Member of [employer] Organizations: Yes    Attends CArchivistMeetings: More than 4 times per  year    Marital Status: Married    Tobacco Counseling Counseling given: Not Answered   Clinical Intake:  Pre-visit preparation completed: Yes  Pain : No/denies pain     BMI - recorded: 35.67 Nutritional Status: BMI > 30  Obese Nutritional Risks: None Diabetes: No  How often do you need to have someone help you when you read instructions, pamphlets, or other written materials from your doctor or pharmacy?: 1 - Never  Diabetic?  No  Interpreter Needed?: No  Information entered by :: Rolene Arbour LPN   Activities of Daily Living    03/17/2022    8:47 AM 03/13/2022   10:16 AM  In your present state of health, do you have any difficulty performing the following activities:  Hearing? 0 0  Vision? 0 0  Difficulty concentrating or making decisions? 0 0  Walking or climbing stairs? 1 1  Comment Rt knee replacement   Dressing or bathing? 0 0  Doing errands, shopping? 0 0  Preparing Food and eating ? N N  Using the Toilet? N N  In the past six months, have you accidently leaked urine? N Y  Do you have problems with loss of bowel control? N N  Managing your Medications? N N  Managing your Finances? N N  Housekeeping or managing your Housekeeping? N N    Patient Care Team: [name], MD as PCP - General (Family Medicine) Rutherford Guys, MD as Consulting Physician (Ophthalmology) Jerline Pain, MD as Consulting Physician (Cardiology) Ronald Lobo, MD as Consulting Physician (Gastroenterology) Particia Nearing, MD as Attending Physician (Dermatology)  Indicate any recent Medical Services you may have received from other than [facility] providers in the past year (date may be approximate).     Assessment:   This is a routine wellness examination for [place].  Hearing/Vision screen Hearing Screening - Comments:: Denies hearing difficulties   Vision Screening - Comments:: Wears rx glasses - up to date with routine eye exams with  Dr Gershon Crane  Dietary issues and exercise activities discussed: Exercise limited by: None identified   Goals Addressed               This Visit's Progress     Increase physical activity (pt-stated)        Lose weight       Depression Screen    03/17/2022    8:44 AM 11/13/2021    8:00 AM 05/08/2021    8:59 AM 02/06/2021   11:35 AM 02/06/2021   11:26 AM 02/06/2021   11:23 AM 10/24/2020    9:59 AM  PHQ 2/9 Scores  PHQ - 2 Score 0 0 0 0 0 0 0  PHQ- 9 Score   0        Fall Risk    03/17/2022    8:48 AM 03/13/2022   10:16 AM 03/01/2022    3:25 PM 11/13/2021    8:00 AM 05/08/2021    8:59 AM  Fall Risk   Falls in the past year? 0 0 0 0 0  Number falls in past yr: 0 0 0 0 0  Injury with Fall? 0 0 0 0 0  Risk for fall due to : No Fall Risks   No Fall Risks No Fall Risks  Follow up Falls prevention discussed   Falls evaluation completed Falls evaluation completed    FALL RISK PREVENTION PERTAINING TO THE HOME:  Any stairs in or around the home? Yes  If so, are there  any without handrails? No  Home free of loose throw rugs in walkways, pet beds, electrical cords, etc? Yes  Adequate lighting in your home to reduce risk of falls? Yes   ASSISTIVE DEVICES UTILIZED TO PREVENT FALLS:  Life alert? No  Use of a cane, walker or w/c? No   Grab bars in the bathroom? Yes  Shower chair or bench in shower? Yes  Elevated toilet seat or a handicapped toilet? Yes   TIMED UP AND GO:  Was the test performed? No . Audio Visit  Cognitive Function:        03/17/2022    8:50 AM 01/11/2020   10:04 AM 10/19/2017    3:04 PM  6CIT Screen  What Year? 0 points 0 points 0 points  What month? 0 points 0 points 0 points  What time? 0 points 0 points 0 points  Count back from 20 0 points 0 points 0 points  Months in reverse 0 points 2 points 0 points  Repeat phrase 0 points 0 points 0 points  Total Score 0 points 2 points 0 points    Immunizations Immunization History  Administered Date(s) Administered   Fluad Quad(high Dose 65+) 10/24/2020, 10/23/2021   Influenza Nasal 10/16/2017   Influenza Whole 11/07/2011   Influenza,inj,Quad PF,6+ Mos 10/28/2012   Influenza-Unspecified 10/26/2013, 10/15/2014, 11/10/2015, 10/19/2016, 11/05/2019   PFIZER(Purple Top)SARS-COV-2 Vaccination 03/04/2019, 03/29/2019, 12/09/2019, 11/26/2020   Pneumococcal Conjugate-13 10/30/2014   Pneumococcal Polysaccharide-23 07/20/2016   Td 03/20/2009   Td (Adult), 2 Lf Tetanus Toxid, Preservative Free 03/20/2009   Tdap 11/24/2019   Zoster Recombinat (Shingrix) 12/04/2021    TDAP status: Up to date  Flu Vaccine status: Up to date  Pneumococcal vaccine status: Up to date  Covid-19 vaccine status: Completed vaccines  Qualifies for Shingles Vaccine? Yes   Zostavax completed Yes   Shingrix Completed?: Yes  Screening Tests Health Maintenance  Topic Date Due   COVID-19 Vaccine (5 - 2023-24 season) 04/02/2022 (Originally 09/26/2021)   Medicare Annual Wellness (AWV)  03/18/2023   DTaP/Tdap/Td (3 - Td or Tdap) 11/23/2029   Pneumonia Vaccine 36+ Years old  Completed   INFLUENZA VACCINE  Completed   DEXA SCAN  Completed   Hepatitis C Screening  Completed   HPV VACCINES  Aged Out   COLONOSCOPY (Pts 45-78yr Insurance coverage will need to be confirmed)   Discontinued   Zoster Vaccines- Shingrix  Discontinued    Health Maintenance  There are no preventive care reminders to display for this patient.   Colorectal cancer screening: No longer required.   Mammogram status: No longer required due to Age.  Bone Density status: Completed 04/15/21. Results reflect: Bone density results: OSTEOPOROSIS. Repeat every   years.  Lung Cancer Screening: (Low Dose CT Chest recommended if Age 77-80years, 30 pack-year currently smoking OR have quit w/in 15years.) does not qualify.     Additional Screening:  Hepatitis C Screening: does qualify; Completed 06/20/15  Vision Screening: Recommended annual ophthalmology exams for early detection of glaucoma and other disorders of the eye. Is the patient up to date with their annual eye exam?  Yes  Who is the provider or what is the name of the office in which the patient attends annual eye exams? Dr SGershon CraneIf pt is not established with a provider, would they like to be referred to a provider to establish care? No .   Dental Screening: Recommended annual dental exams for proper oral hygiene  Community Resource Referral / Chronic Care Management:  CRR required this visit?  No   CCM required this visit?  No      Plan:     I have personally reviewed and noted the following in the patient's chart:   Medical and social history Use of alcohol, tobacco or illicit drugs  Current medications and supplements including opioid prescriptions. Patient is not currently taking opioid prescriptions. Functional ability and status Nutritional status Physical activity Advanced directives List of other physicians Hospitalizations, surgeries, and ER visits in previous 12 months Vitals Screenings to include cognitive, depression, and falls Referrals and appointments  In addition, I have reviewed and discussed with patient certain preventive protocols, quality metrics, and best practice recommendations. A written  personalized care plan for preventive services as well as general preventive health recommendations were provided to patient.     Lori Peaches, LPN   [id number]   Nurse Notes: None

## 2022-03-17 NOTE — Patient Instructions (Addendum)
Lori Mooney , Thank you for taking time to come for your Medicare Wellness Visit. I appreciate your ongoing commitment to your health goals. Please review the following plan we discussed and let me know if I can assist you in the future.   These are the goals we discussed:  Goals       Increase physical activity (pt-stated)      Lose weight      Weight (lb) < 200 lb (90.7 kg)        This is a list of the screening recommended for you and due dates:  Health Maintenance  Topic Date Due   COVID-19 Vaccine (5 - 2023-24 season) 04/02/2022*   Medicare Annual Wellness Visit  03/18/2023   DTaP/Tdap/Td vaccine (3 - Td or Tdap) 11/23/2029   Pneumonia Vaccine  Completed   Flu Shot  Completed   DEXA scan (bone density measurement)  Completed   Hepatitis C Screening: USPSTF Recommendation to screen - Ages 25-79 yo.  Completed   HPV Vaccine  Aged Out   Colon Cancer Screening  Discontinued   Zoster (Shingles) Vaccine  Discontinued  *Topic was postponed. The date shown is not the original due date.    Advanced directives: Please bring a copy of your health care power of attorney and living will to the office to be added to your chart at your convenience.   Conditions/risks identified: None  Next appointment: Follow up in one year for your annual wellness visit     Preventive Care 65 Years and Older, Female Preventive care refers to lifestyle choices and visits with your health care provider that can promote health and wellness. What does preventive care include? A yearly physical exam. This is also called an annual well check. Dental exams once or twice a year. Routine eye exams. Ask your health care provider how often you should have your eyes checked. Personal lifestyle choices, including: Daily care of your teeth and gums. Regular physical activity. Eating a healthy diet. Avoiding tobacco and drug use. Limiting alcohol use. Practicing safe sex. Taking low-dose aspirin every  day. Taking vitamin and mineral supplements as recommended by your health care provider. What happens during an annual well check? The services and screenings done by your health care provider during your annual well check will depend on your age, overall health, lifestyle risk factors, and family history of disease. Counseling  Your health care provider may ask you questions about your: Alcohol use. Tobacco use. Drug use. Emotional well-being. Home and relationship well-being. Sexual activity. Eating habits. History of falls. Memory and ability to understand (cognition). Work and work Statistician. Reproductive health. Screening  You may have the following tests or measurements: Height, weight, and BMI. Blood pressure. Lipid and cholesterol levels. These may be checked every 5 years, or more frequently if you are over 65 years old. Skin check. Lung cancer screening. You may have this screening every year starting at age 51 if you have a 30-pack-year history of smoking and currently smoke or have quit within the past 15 years. Fecal occult blood test (FOBT) of the stool. You may have this test every year starting at age 30. Flexible sigmoidoscopy or colonoscopy. You may have a sigmoidoscopy every 5 years or a colonoscopy every 10 years starting at age 50. Hepatitis C blood test. Hepatitis B blood test. Sexually transmitted disease (STD) testing. Diabetes screening. This is done by checking your blood sugar (glucose) after you have not eaten for a while (fasting). You may  have this done every 1-3 years. Bone density scan. This is done to screen for osteoporosis. You may have this done starting at age 17. Mammogram. This may be done every 1-2 years. Talk to your health care provider about how often you should have regular mammograms. Talk with your health care provider about your test results, treatment options, and if necessary, the need for more tests. Vaccines  Your health care  provider may recommend certain vaccines, such as: Influenza vaccine. This is recommended every year. Tetanus, diphtheria, and acellular pertussis (Tdap, Td) vaccine. You may need a Td booster every 10 years. Zoster vaccine. You may need this after age 55. Pneumococcal 13-valent conjugate (PCV13) vaccine. One dose is recommended after age 16. Pneumococcal polysaccharide (PPSV23) vaccine. One dose is recommended after age 8. Talk to your health care provider about which screenings and vaccines you need and how often you need them. This information is not intended to replace advice given to you by your health care provider. Make sure you discuss any questions you have with your health care provider. Document Released: 02/08/2015 Document Revised: 10/02/2015 Document Reviewed: 11/13/2014 Elsevier Interactive Patient Education  2017 [place] Prevention in the Home Falls can cause injuries. They can happen to people of all ages. There are many things you can do to make your home safe and to help prevent falls. What can I do on the outside of my home? Regularly fix the edges of walkways and driveways and fix any cracks. Remove anything that might make you trip as you walk through a door, such as a raised step or threshold. Trim any bushes or trees on the path to your home. Use bright outdoor lighting. Clear any walking paths of anything that might make someone trip, such as rocks or tools. Regularly check to see if handrails are loose or broken. Make sure that both sides of any steps have handrails. Any raised decks and porches should have guardrails on the edges. Have any leaves, snow, or ice cleared regularly. Use sand or salt on walking paths during winter. Clean up any spills in your garage right away. This includes oil or grease spills. What can I do in the bathroom? Use night lights. Install grab bars by the toilet and in the tub and shower. Do not use towel bars as grab  bars. Use non-skid mats or decals in the tub or shower. If you need to sit down in the shower, use a plastic, non-slip stool. Keep the floor dry. Clean up any water that spills on the floor as soon as it happens. Remove soap buildup in the tub or shower regularly. Attach bath mats securely with double-sided non-slip rug tape. Do not have throw rugs and other things on the floor that can make you trip. What can I do in the bedroom? Use night lights. Make sure that you have a light by your bed that is easy to reach. Do not use any sheets or blankets that are too big for your bed. They should not hang down onto the floor. Have a firm chair that has side arms. You can use this for support while you get dressed. Do not have throw rugs and other things on the floor that can make you trip. What can I do in the kitchen? Clean up any spills right away. Avoid walking on wet floors. Keep items that you use a lot in easy-to-reach places. If you need to reach something above you, use a strong step  stool that has a grab bar. Keep electrical cords out of the way. Do not use floor polish or wax that makes floors slippery. If you must use wax, use non-skid floor wax. Do not have throw rugs and other things on the floor that can make you trip. What can I do with my stairs? Do not leave any items on the stairs. Make sure that there are handrails on both sides of the stairs and use them. Fix handrails that are broken or loose. Make sure that handrails are as long as the stairways. Check any carpeting to make sure that it is firmly attached to the stairs. Fix any carpet that is loose or worn. Avoid having throw rugs at the top or bottom of the stairs. If you do have throw rugs, attach them to the floor with carpet tape. Make sure that you have a light switch at the top of the stairs and the bottom of the stairs. If you do not have them, ask someone to add them for you. What else can I do to help prevent  falls? Wear shoes that: Do not have high heels. Have rubber bottoms. Are comfortable and fit you well. Are closed at the toe. Do not wear sandals. If you use a stepladder: Make sure that it is fully opened. Do not climb a closed stepladder. Make sure that both sides of the stepladder are locked into place. Ask someone to hold it for you, if possible. Clearly mark and make sure that you can see: Any grab bars or handrails. First and last steps. Where the edge of each step is. Use tools that help you move around (mobility aids) if they are needed. These include: Canes. Walkers. Scooters. Crutches. Turn on the lights when you go into a dark area. Replace any light bulbs as soon as they burn out. Set up your furniture so you have a clear path. Avoid moving your furniture around. If any of your floors are uneven, fix them. If there are any pets around you, be aware of where they are. Review your medicines with your doctor. Some medicines can make you feel dizzy. This can increase your chance of falling. Ask your doctor what other things that you can do to help prevent falls. This information is not intended to replace advice given to you by your health care provider. Make sure you discuss any questions you have with your health care provider. Document Released: 11/08/2008 Document Revised: 06/20/2015 Document Reviewed: 02/16/2014 Elsevier Interactive Patient Education  2017 [employer].

## 2022-03-24 ENCOUNTER — Ambulatory Visit (INDEPENDENT_AMBULATORY_CARE_PROVIDER_SITE_OTHER): Payer: Medicare Other | Admitting: Adult Health

## 2022-03-24 ENCOUNTER — Encounter (INDEPENDENT_AMBULATORY_CARE_PROVIDER_SITE_OTHER): Payer: Self-pay | Admitting: Adult Health

## 2022-03-24 VITALS — BP 120/70 | HR 101 | Temp 98.1°F | Ht 62.0 in | Wt 190.0 lb

## 2022-03-24 DIAGNOSIS — R7303 Prediabetes: Secondary | ICD-10-CM

## 2022-03-24 DIAGNOSIS — I10 Essential (primary) hypertension: Secondary | ICD-10-CM | POA: Diagnosis not present

## 2022-03-24 DIAGNOSIS — E669 Obesity, unspecified: Secondary | ICD-10-CM | POA: Diagnosis not present

## 2022-03-24 DIAGNOSIS — E559 Vitamin D deficiency, unspecified: Secondary | ICD-10-CM

## 2022-03-24 DIAGNOSIS — E66812 Obesity, class 2: Secondary | ICD-10-CM

## 2022-03-24 DIAGNOSIS — Z6834 Body mass index (BMI) 34.0-34.9, adult: Secondary | ICD-10-CM

## 2022-03-24 NOTE — Progress Notes (Addendum)
Chief Complaint:   OBESITY Lori Mooney is here to discuss her progress with her obesity treatment plan along with follow-up of her obesity related diagnoses. Lori Mooney is on the Category 1 Plan and states she is following her eating plan approximately 70-80% of the time.  Lori Mooney states she is participating in Aerobics 45 minutes 2 times per week.  Today's visit was #: 8 Starting weight: 203 lbs Starting date: 01/30/2020 Today's weight: 190 lbs Today's date: 03/24/2022 Total lbs lost to date: 13 Total lbs lost since last in-office visit: - 1 lb  Interim History:  Lori Mooney is being seen Q[facility] at [place] in office appt at [place] was 12/30/21 with Shawn Rayburn, PA-C  She has remained on Metformin therapy- however only one '500mg'$  tab QD. She stopped afternoon '250mg'$  dose due to low supply of Rx.  He father is stable in skilled nursing home- he will turn 100 in Nov 2024.  Her mother-in-law (also 96 years old) is in Hospice care- [facility]. Her husband is struggling with the decline oh his mother.  Of Notes- 07/01/2021 RMR 1325 Recommend checking IC at next OV  Subjective:   1. Vitamin D deficiency  Latest Reference Range & Units 07/01/21 10:15  Vitamin D, 25-Hydroxy 30.0 - 100.0 ng/mL 50.2  She is currently on OTC Vit D3 1000 IU QD  2. Pre-diabetes Lab Results  Component Value Date   HGBA1C 6.2 11/13/2021   HGBA1C 5.8 (H) 07/01/2021   HGBA1C 5.7 (H) 01/28/2021  She has been taking only taking 1 tab Metformin '500mg'$  with breakfast- due to low Rx supply she stopped afternoon '250mg'$  dose. She denies GI upset. She is unsure if Metformin helped decrease sugar/CHO cravings.  3. Essential hypertension BP at goal at OV PCP/Dr. Carlota Raspberry manages Lisinopril/HCTZ 10/12.'5mg'$  QD Last CMP 10/23- GFR fell to 49                             Serum Creat 1.09 Her mother had end stage CKD She estimates to drink 6-8 glasses water/day  Assessment/Plan:   1. Vitamin D deficiency Check Labs - VITAMIN  D 25 Hydroxy (Vit-D Deficiency, Fractures)  2. Pre-diabetes Check Labs Will Mychart pt with results and RF as appropriate. Firmly recommend staying on Metformin if A1c >6.0  - Hemoglobin A1c - Insulin, random - Vitamin B12  3. Essential hypertension Check Labs - Comprehensive metabolic panel Avoid Nephrotoxic substances Remain well hydrated.  4. Obesity, current BMI 34.9   Lori Mooney is currently in the action stage of change. As such, her goal is to continue with weight loss efforts. She has agreed to the Category 1 Plan.   Exercise goals: Older adults should follow the adult guidelines. When older adults cannot meet the adult guidelines, they should be as physically active as their abilities and conditions will allow.   Behavioral modification strategies: increasing lean protein intake, decreasing simple carbohydrates, increasing vegetables, increasing water intake, meal planning and cooking strategies, keeping healthy foods in the home, and planning for success.  Lori Mooney has agreed to follow-up with our clinic in 8 weeks. She was informed of the importance of frequent follow-up visits to maximize her success with intensive lifestyle modifications for her multiple health conditions.   Lori Mooney was informed we would discuss her lab results at her next visit unless there is a critical issue that needs to be addressed sooner. Lori Mooney agreed to keep her next visit at the agreed  upon time to discuss these results.  Check IC at next OV.  Pt aware to be be fasting and arrive 30 mins early prior to OV.  Objective:   Blood pressure 120/70, pulse (!) 101, temperature 98.1 F (36.7 C), height '5\' 2"'$  (1.575 m), weight 190 lb (86.2 kg), SpO2 98 %. Body mass index is 34.75 kg/m.  General: Cooperative, alert, well developed, in no acute distress. HEENT: Conjunctivae and lids unremarkable. Cardiovascular: Regular rhythm.  Lungs: Normal work of breathing. Neurologic: No focal deficits.   Lab  Results  Component Value Date   CREATININE 1.09 11/13/2021   BUN 16 11/13/2021   NA 136 11/13/2021   K 3.8 11/13/2021   CL 100 11/13/2021   CO2 28 11/13/2021   Lab Results  Component Value Date   ALT 17 11/13/2021   AST 20 11/13/2021   ALKPHOS 63 11/13/2021   BILITOT 0.5 11/13/2021   Lab Results  Component Value Date   HGBA1C 6.2 11/13/2021   HGBA1C 5.8 (H) 07/01/2021   HGBA1C 5.7 (H) 01/28/2021   HGBA1C 5.9 10/22/2020   HGBA1C 5.8 (H) 07/15/2020   Lab Results  Component Value Date   INSULIN 20.4 07/01/2021   INSULIN 26.6 (H) 01/28/2021   INSULIN 23.6 07/15/2020   INSULIN 20.7 01/30/2020   Lab Results  Component Value Date   TSH 2.590 01/30/2020   Lab Results  Component Value Date   CHOL 195 11/13/2021   HDL 64.70 11/13/2021   LDLCALC 67 05/06/2021   LDLDIRECT 96.0 11/13/2021   TRIG 241.0 (H) 11/13/2021   CHOLHDL 3 11/13/2021   Lab Results  Component Value Date   VD25OH 50.2 07/01/2021   VD25OH 52.0 01/28/2021   VD25OH 45.66 10/24/2020   Lab Results  Component Value Date   WBC 6.0 10/22/2020   HGB 14.9 10/22/2020   HCT 45.0 10/22/2020   MCV 89.6 10/22/2020   PLT 273.0 10/22/2020   No results found for: "IRON", "TIBC", "FERRITIN"  Attestation Statements:   Reviewed by clinician on day of visit: allergies, medications, problem list, medical history, surgical history, family history, social history, and previous encounter notes.  I have reviewed the above documentation for accuracy and completeness, and I agree with the above. -  Swain Acree d. Fotini Lemus, NP-C

## 2022-03-25 ENCOUNTER — Encounter (INDEPENDENT_AMBULATORY_CARE_PROVIDER_SITE_OTHER): Payer: Self-pay | Admitting: Adult Health

## 2022-03-25 ENCOUNTER — Other Ambulatory Visit (INDEPENDENT_AMBULATORY_CARE_PROVIDER_SITE_OTHER): Payer: Self-pay | Admitting: Adult Health

## 2022-03-25 DIAGNOSIS — R7303 Prediabetes: Secondary | ICD-10-CM

## 2022-03-25 LAB — VITAMIN B12: Vitamin B-12: 523 pg/mL (ref 232–1245)

## 2022-03-25 LAB — COMPREHENSIVE METABOLIC PANEL
ALT: 17 IU/L (ref 0–32)
AST: 15 IU/L (ref 0–40)
Albumin/Globulin Ratio: 2.6 — ABNORMAL HIGH (ref 1.2–2.2)
Albumin: 4.9 g/dL — ABNORMAL HIGH (ref 3.8–4.8)
Alkaline Phosphatase: 75 IU/L (ref 44–121)
BUN/Creatinine Ratio: 20 (ref 12–28)
BUN: 19 mg/dL (ref 8–27)
Bilirubin Total: 0.6 mg/dL (ref 0.0–1.2)
CO2: 21 mmol/L (ref 20–29)
Calcium: 10.2 mg/dL (ref 8.7–10.3)
Chloride: 101 mmol/L (ref 96–106)
Creatinine, Ser: 0.94 mg/dL (ref 0.57–1.00)
Globulin, Total: 1.9 g/dL (ref 1.5–4.5)
Glucose: 88 mg/dL (ref 70–99)
Potassium: 4.5 mmol/L (ref 3.5–5.2)
Sodium: 140 mmol/L (ref 134–144)
Total Protein: 6.8 g/dL (ref 6.0–8.5)
eGFR: 63 mL/min/{1.73_m2} (ref >59)

## 2022-03-25 LAB — INSULIN, RANDOM: INSULIN: 20.5 u[IU]/mL (ref 2.6–24.9)

## 2022-03-25 LAB — HEMOGLOBIN A1C
Est. average glucose Bld gHb Est-mCnc: 120 mg/dL
Hgb A1c MFr Bld: 5.8 % — ABNORMAL HIGH (ref 4.8–5.6)

## 2022-03-25 LAB — VITAMIN D 25 HYDROXY (VIT D DEFICIENCY, FRACTURES): Vit D, 25-Hydroxy: 63.2 ng/mL (ref 30.0–100.0)

## 2022-03-25 MED ORDER — METFORMIN HCL 500 MG PO TABS: ORAL_TABLET | ORAL | 0 refills | Status: DC

## 2022-04-09 DIAGNOSIS — H35372 Puckering of macula, left eye: Secondary | ICD-10-CM | POA: Diagnosis not present

## 2022-04-09 DIAGNOSIS — H40003 Preglaucoma, unspecified, bilateral: Secondary | ICD-10-CM | POA: Diagnosis not present

## 2022-04-09 DIAGNOSIS — H25811 Combined forms of age-related cataract, right eye: Secondary | ICD-10-CM | POA: Diagnosis not present

## 2022-04-09 DIAGNOSIS — Z961 Presence of intraocular lens: Secondary | ICD-10-CM | POA: Diagnosis not present

## 2022-04-21 ENCOUNTER — Ambulatory Visit (INDEPENDENT_AMBULATORY_CARE_PROVIDER_SITE_OTHER): Payer: Medicare Other | Admitting: Adult Health

## 2022-04-21 ENCOUNTER — Encounter (INDEPENDENT_AMBULATORY_CARE_PROVIDER_SITE_OTHER): Payer: Self-pay | Admitting: Adult Health

## 2022-04-21 VITALS — BP 111/75 | HR 92 | Temp 97.7°F | Ht 62.0 in | Wt 190.0 lb

## 2022-04-21 DIAGNOSIS — R7303 Prediabetes: Secondary | ICD-10-CM

## 2022-04-21 DIAGNOSIS — Z6834 Body mass index (BMI) 34.0-34.9, adult: Secondary | ICD-10-CM

## 2022-04-21 DIAGNOSIS — E669 Obesity, unspecified: Secondary | ICD-10-CM | POA: Diagnosis not present

## 2022-04-21 DIAGNOSIS — E559 Vitamin D deficiency, unspecified: Secondary | ICD-10-CM | POA: Diagnosis not present

## 2022-04-21 DIAGNOSIS — R0602 Shortness of breath: Secondary | ICD-10-CM

## 2022-04-21 DIAGNOSIS — I10 Essential (primary) hypertension: Secondary | ICD-10-CM | POA: Diagnosis not present

## 2022-04-21 MED ORDER — METFORMIN HCL 500 MG PO TABS: ORAL_TABLET | ORAL | 0 refills | Status: DC

## 2022-04-21 NOTE — Progress Notes (Signed)
WEIGHT SUMMARY AND BIOMETRICS  Vitals Temp: 97.7 F (36.5 C) BP: 111/75 Pulse Rate: 92 SpO2: 97 %   Anthropometric Measurements Height: 5\' 2"  (1.575 m) Weight: 190 lb (86.2 kg) BMI (Calculated): 34.74 Weight at Last Visit: 190 Weight Lost Since Last Visit: 13 Weight Gained Since Last Visit: 0 Starting Weight: 203   Body Composition  Body Fat %: 47.1 % Fat Mass (lbs): 89.4 lbs Muscle Mass (lbs): 95.4 lbs Total Body Water (lbs): 72 lbs Visceral Fat Rating : 15   Other Clinical Data RMR: 1382 Fasting: yes Today's Visit #: 27 Starting Date: 01/30/20    Chief Complaint:   OBESITY Lori Mooney is here to discuss her progress with her obesity treatment plan. She is on the the Category 1 Plan and states she is following her eating plan approximately 70 % of the time.  She states she is exercising Low Impact Aerobics 45 minutes 2 times per week.   Interim History:  Her 4 year old mother in law passed peacefully away last wee. Her husband is adjusting to the loss. She and her husband will spend [place] up [place] with her father- who is maintaining his health.  01/30/2020 RMR 1306 07/15/2020 RMR 1759 07/01/2021 RMR 1325 04/21/2022 RMR 1382- slower than expected, however increased by 57 calories from last IC check.  Subjective:   1. Pre-diabetes Discussed Labs-A1c improved!  Latest Reference Range & Units 03/24/22 08:02  Glucose 70 - 99 mg/dL 88  Hemoglobin A1C 4.8 - 5.6 % 5.8 (H)  Est. average glucose Bld gHb Est-mCnc mg/dL 120  INSULIN 2.6 - 24.9 uIU/mL 20.5  (H): Data is abnormally high 03/24/22 CMP GFR: >60 She is tolerating daily Metformin 500mg  QAM  2. Essential hypertension Discussed Labs 03/24/22 CMP: Electrolytes and Kidney fx stable BP at goal at OV  3. Vitamin D deficiency Discussed Labs  Latest Reference Range & Units 03/24/22 08:02  Vitamin D, 25-Hydroxy 30.0 - 100.0 ng/mL 63.2  She is on daily OTC Vit D 3 1,000 IU  4. SOB (shortness of breath)  on exertion Discussed Labs She endorses dyspnea with extreme exertion, denies CP. 01/30/2020 RMR 1306 07/15/2020 RMR 1759 07/01/2021 RMR 1325 04/21/2022 RMR 1382- slower than expected, however increased by 57 calories from last IC check.   Assessment/Plan:   1. Pre-diabetes Refill - metFORMIN (GLUCOPHAGE) 500 MG tablet; 1 tab with breakfast  Dispense: 60 tablet; Refill: 0  2. Essential hypertension Continue current antihypertensive therapy  3. Vitamin D deficiency Continue current OTC Vit D Supplementation  4. SOB (shortness of breath) on exertion Check IC  To continue weight loss follow Cat 1 or 1000 cal/80 g protein per day To maintain weight follow 1300-1400 cal/100 g protein per day  5. Obesity, current BMI 34.8  Lori Mooney is currently in the action stage of change. As such, her goal is to continue with weight loss efforts. She has agreed to the Category 1 Plan.   Exercise goals: Older adults should follow the adult guidelines. When older adults cannot meet the adult guidelines, they should be as physically active as their abilities and conditions will allow.  Older adults should do exercises that maintain or improve balance if they are at risk of falling.  Older adults with chronic conditions should understand whether and how their conditions affect their ability to do regular physical activity safely.  Behavioral modification strategies: increasing lean protein intake, decreasing simple carbohydrates, increasing vegetables, increasing water intake, better snacking choices, planning for success, and keeping  a strict food journal.  Lori Mooney has agreed to follow-up with our clinic in 8 weeks. She was informed of the importance of frequent follow-up visits to maximize her success with intensive lifestyle modifications for her multiple health conditions.   Objective:   Blood pressure 111/75, pulse 92, temperature 97.7 F (36.5 C), height 5\' 2"  (1.575 m), weight 190 lb (86.2 kg), SpO2 97  %. Body mass index is 34.75 kg/m.  General: Cooperative, alert, well developed, in no acute distress. HEENT: Conjunctivae and lids unremarkable. Cardiovascular: Regular rhythm.  Lungs: Normal work of breathing. Neurologic: No focal deficits.   Lab Results  Component Value Date   CREATININE 0.94 03/24/2022   BUN 19 03/24/2022   NA 140 03/24/2022   K 4.5 03/24/2022   CL 101 03/24/2022   CO2 21 03/24/2022   Lab Results  Component Value Date   ALT 17 03/24/2022   AST 15 03/24/2022   ALKPHOS 75 03/24/2022   BILITOT 0.6 03/24/2022   Lab Results  Component Value Date   HGBA1C 5.8 (H) 03/24/2022   HGBA1C 6.2 11/13/2021   HGBA1C 5.8 (H) 07/01/2021   HGBA1C 5.7 (H) 01/28/2021   HGBA1C 5.9 10/22/2020   Lab Results  Component Value Date   INSULIN 20.5 03/24/2022   INSULIN 20.4 07/01/2021   INSULIN 26.6 (H) 01/28/2021   INSULIN 23.6 07/15/2020   INSULIN 20.7 01/30/2020   Lab Results  Component Value Date   TSH 2.590 01/30/2020   Lab Results  Component Value Date   CHOL 195 11/13/2021   HDL 64.70 11/13/2021   LDLCALC 67 05/06/2021   LDLDIRECT 96.0 11/13/2021   TRIG 241.0 (H) 11/13/2021   CHOLHDL 3 11/13/2021   Lab Results  Component Value Date   VD25OH 63.2 03/24/2022   VD25OH 50.2 07/01/2021   VD25OH 52.0 01/28/2021   Lab Results  Component Value Date   WBC 6.0 10/22/2020   HGB 14.9 10/22/2020   HCT 45.0 10/22/2020   MCV 89.6 10/22/2020   PLT 273.0 10/22/2020   No results found for: "IRON", "TIBC", "FERRITIN"  Attestation Statements:   Reviewed by clinician on day of visit: allergies, medications, problem list, medical history, surgical history, family history, social history, and previous encounter notes.  I have reviewed the above documentation for accuracy and completeness, and I agree with the above. -  Raissa Dam d. Rylinn Linzy, NP-C

## 2022-04-30 DIAGNOSIS — L918 Other hypertrophic disorders of the skin: Secondary | ICD-10-CM | POA: Diagnosis not present

## 2022-04-30 DIAGNOSIS — L821 Other seborrheic keratosis: Secondary | ICD-10-CM | POA: Diagnosis not present

## 2022-04-30 DIAGNOSIS — L570 Actinic keratosis: Secondary | ICD-10-CM | POA: Diagnosis not present

## 2022-04-30 DIAGNOSIS — D0462 Carcinoma in situ of skin of left upper limb, including shoulder: Secondary | ICD-10-CM | POA: Diagnosis not present

## 2022-04-30 DIAGNOSIS — D485 Neoplasm of uncertain behavior of skin: Secondary | ICD-10-CM | POA: Diagnosis not present

## 2022-04-30 DIAGNOSIS — C44712 Basal cell carcinoma of skin of right lower limb, including hip: Secondary | ICD-10-CM | POA: Diagnosis not present

## 2022-04-30 DIAGNOSIS — Z85828 Personal history of other malignant neoplasm of skin: Secondary | ICD-10-CM | POA: Diagnosis not present

## 2022-05-15 ENCOUNTER — Ambulatory Visit (INDEPENDENT_AMBULATORY_CARE_PROVIDER_SITE_OTHER): Payer: Medicare Other | Admitting: Family Medicine

## 2022-05-15 ENCOUNTER — Encounter: Payer: Self-pay | Admitting: Family Medicine

## 2022-05-15 VITALS — BP 118/74 | HR 84 | Temp 98.4°F | Ht 62.0 in | Wt 194.8 lb

## 2022-05-15 DIAGNOSIS — G8929 Other chronic pain: Secondary | ICD-10-CM | POA: Diagnosis not present

## 2022-05-15 DIAGNOSIS — R7303 Prediabetes: Secondary | ICD-10-CM

## 2022-05-15 DIAGNOSIS — Z96652 Presence of left artificial knee joint: Secondary | ICD-10-CM | POA: Diagnosis not present

## 2022-05-15 DIAGNOSIS — E7800 Pure hypercholesterolemia, unspecified: Secondary | ICD-10-CM | POA: Diagnosis not present

## 2022-05-15 DIAGNOSIS — M25562 Pain in left knee: Secondary | ICD-10-CM

## 2022-05-15 DIAGNOSIS — I10 Essential (primary) hypertension: Secondary | ICD-10-CM | POA: Diagnosis not present

## 2022-05-15 LAB — LIPID PANEL
Cholesterol: 182 mg/dL (ref 0–200)
HDL: 63.7 mg/dL (ref >39.00)
LDL Cholesterol: 82 mg/dL (ref 0–99)
NonHDL: 117.82
Total CHOL/HDL Ratio: 3
Triglycerides: 181 mg/dL — ABNORMAL HIGH (ref 0.0–149.0)
VLDL: 36.2 mg/dL (ref 0.0–40.0)

## 2022-05-15 MED ORDER — LISINOPRIL-HYDROCHLOROTHIAZIDE 10-12.5 MG PO TABS: 1.0000 | ORAL_TABLET | Freq: Every day | ORAL | 3 refills | Status: DC

## 2022-05-15 MED ORDER — ATORVASTATIN CALCIUM 20 MG PO TABS: 20.0000 mg | ORAL_TABLET | Freq: Every day | ORAL | 3 refills | Status: DC

## 2022-05-15 NOTE — Patient Instructions (Signed)
Thanks for coming in today.  I will refer you to Ortho to discuss options for your knee.  No med changes today.  I will let you know if any concerns on labs.  Take care

## 2022-05-15 NOTE — Progress Notes (Signed)
Subjective:  Patient ID: Lori Mooney, female    DOB: Jun 24, 1945  Age: 77 y.o. MRN: [id number]  CC:  Chief Complaint  Patient presents with   Hypertension    Pt states  no concerns  Pt fasting    HPI Lori Mooney presents for   Hypertension: Also followed by healthy weight and wellness, BP 111/75 at March 26 visit.  A1c was improving.  Lisinopril HCT 10/12.5 mg daily.  Various med dosages previously with change in control, up to 20 mg of lisinopril.  Currently taking 10/12.5mg , no new med side effects Home readings:100s/60-70, sometimes up to 130. No lightheadedness/dizziness.   BP Readings from Last 3 Encounters:  05/15/22 118/74  04/21/22 111/75  03/24/22 120/70   Lab Results  Component Value Date   CREATININE 0.94 03/24/2022   Hyperlipidemia: Lipitor 20 mg daily, no new myalgias/side effects.  Lab Results  Component Value Date   CHOL 195 11/13/2021   HDL 64.70 11/13/2021   LDLCALC 67 05/06/2021   LDLDIRECT 96.0 11/13/2021   TRIG 241.0 (H) 11/13/2021   CHOLHDL 3 11/13/2021   Lab Results  Component Value Date   ALT 17 03/24/2022   AST 15 03/24/2022   ALKPHOS 75 03/24/2022   BILITOT 0.6 03/24/2022   Prediabetes: On metformin, followed by healthy weight and wellness.  Recent A1c improved.  Some stress with mother in law passed a month ago, cousin in past week. Feels like handling stress ok.  Lab Results  Component Value Date   HGBA1C 5.8 (H) 03/24/2022   Wt Readings from Last 3 Encounters:  05/15/22 194 lb 12.8 oz (88.4 kg)  04/21/22 190 lb (86.2 kg)  03/24/22 190 lb (86.2 kg)    L knee numbness: History of left total knee replacement in July 2020, Dr. Darrelyn Hillock. Persistent numbness/pain, more stiff. Aerobics 2 times per week. Has not seen ortho recently.  Tylenol BID.   History Patient Active Problem List   Diagnosis Date Noted   Osteopenia 05/09/2021   Status post colonoscopy 09/26/2020   Insulin resistance 02/28/2020   Other  hyperlipidemia 02/14/2020   Essential hypertension 01/30/2020   Hypertriglyceridemia 01/30/2020   Prediabetes 01/30/2020   Osteoarthritis 01/30/2020   Vitamin D deficiency 01/30/2020   H/O total knee replacement, left 08/24/2018   Osteoarthritis of spine with radiculopathy, lumbar region 06/25/2017   Back pain with sciatica 06/25/2017   Spinal stenosis of lumbar region without neurogenic claudication 06/25/2017   GERD (gastroesophageal reflux disease) 09/19/2013   Vertigo 03/02/2012   Hypercholesterolemia 10/08/2010   Benign hypertensive heart disease without heart failure 10/08/2010   Class 2 severe obesity with serious comorbidity and body mass index (BMI) of 37.0 to 37.9 in adult 10/08/2010   Past Medical History:  Diagnosis Date   Allergy    Arthritis    history of arthritis in the fingers   Cataract    Constipation    DDD (degenerative disc disease), lumbar    Exogenous obesity    GERD (gastroesophageal reflux disease)    Heartburn    Hypercholesterolemia    Hyperlipidemia    hypercholesterolemia   Hypertension    Hypertriglyceridemia    Osteoarthritis    Palpitations    Prediabetes    SOB (shortness of breath) on exertion    Swelling of both lower extremities    Past Surgical History:  Procedure Laterality Date   CATARACT EXTRACTION W/ INTRAOCULAR LENS IMPLANT Left    DILITATION & CURRETTAGE/HYSTROSCOPY WITH VERSAPOINT RESECTION N/A 08/25/2012  Procedure: DILATATION & CURETTAGE/HYSTEROSCOPY WITH VERSAPOINT RESECTION;  Surgeon: Genia Del, MD;  Location: [facility];  Service: Gynecology;  Laterality: N/A;   EYE SURGERY     LUMBAR EPIDURAL INJECTION  08/04/2017   TONSILLECTOMY     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   Procedure: TOTAL KNEE ARTHROPLASTY;  Surgeon: Ranee Gosselin, MD;  Location: [facility];  Service: Orthopedics;  Laterality: Left;    TUBAL LIGATION     Allergies  Allergen Reactions   Shellfish Allergy Hives   Strawberry Extract Hives    Prior to Admission medications   Medication Sig Start Date End Date Taking? Authorizing Provider  acetaminophen (TYLENOL) 325 MG tablet Take 650 mg by mouth every 6 (six) hours as needed.   Yes [provider]  atorvastatin (LIPITOR) 20 MG tablet Take 1 tablet (20 mg total) by mouth daily. 05/08/21  Yes Shade Flood, MD  Biotin w/ Vitamins C & E (HAIR/SKIN/NAILS PO) Take by mouth. Natures Bounty   Yes [provider]  Cholecalciferol (VITAMIN D-3) 1000 UNITS CAPS Take 1,000 Units by mouth daily.   Yes [provider]  famotidine (PEPCID) 10 MG tablet Take 10 mg by mouth at bedtime.    Yes [provider]  famotidine (PEPCID) 10 MG tablet 10 mg daily. 09/15/18  Yes [provider]  lisinopril-hydrochlorothiazide (ZESTORETIC) 10-12.5 MG tablet Take 1 tablet by mouth daily. 05/08/21  Yes Shade Flood, MD  loratadine (CLARITIN) 10 MG tablet Take 10 mg by mouth daily as needed for allergies.    Yes [provider]  magnesium 30 MG tablet Take 250 mg by mouth daily.    Yes [provider]  meloxicam (MOBIC) 7.5 MG tablet TAKE 1 TABLET BY MOUTH ONCE DAILY AS NEEDED FOR PAIN 12/03/21  Yes Shade Flood, MD  metFORMIN (GLUCOPHAGE) 500 MG tablet 1 tab with breakfast 04/21/22  Yes Danford, Jinny Blossom, NP   Social History   Socioeconomic History   Marital status: Married    Spouse name: Kanai Hilger   Number of children: 2   Years of education: Not on file   Highest education level: Bachelor's degree (e.g., BA, AB, BS)  Occupational History   Occupation: Retired Charity fundraiser  Tobacco Use   Smoking status: Never   Smokeless tobacco: Never  Vaping Use   Vaping Use: Never used  Substance and Sexual Activity   Alcohol use: Yes    Alcohol/week: 1.0 - 2.0 standard drink of alcohol    Types: 1 - 2 Standard drinks or equivalent per week   Drug use: No   Sexual activity: Yes  Other Topics Concern   Not on file  Social History Narrative    Married. Education: [employer].   Social Determinants of Health   Financial Resource Strain: Low Risk  (05/11/2022)   Overall Financial Resource Strain (CARDIA)    Difficulty of Paying Living Expenses: Not hard at all  Food Insecurity: No Food Insecurity (05/11/2022)   Hunger Vital Sign    Worried About Running Out of Food in the Last Year: Never true    Ran Out of Food in the Last Year: Never true  Transportation Needs: No Transportation Needs (05/11/2022)   PRAPARE - Administrator, Civil Service (Medical): No    Lack of Transportation (Non-Medical): No  Physical Activity: Sufficiently Active (05/11/2022)   Exercise Vital Sign    Days of Exercise per Week: 3 days    Minutes of Exercise per Session: 50  min  Stress: No Stress Concern Present (05/11/2022)   [employer] of Occupational Health - Occupational Stress Questionnaire    Feeling of Stress : Not at all  Social Connections: Socially Integrated (05/11/2022)   Social Connection and Isolation Panel [NHANES]    Frequency of Communication with Friends and Family: Three times a week    Frequency of Social Gatherings with Friends and Family: Once a week    Attends Religious Services: More than 4 times per year    Active Member of [employer] or Organizations: Yes    Attends Engineer, structural: More than 4 times per year    Marital Status: Married  Catering manager Violence: Not At Risk (03/17/2022)   Humiliation, Afraid, Rape, and Kick questionnaire    Fear of Current or Ex-Partner: No    Emotionally Abused: No    Physically Abused: No    Sexually Abused: No    Review of Systems   Objective:   Vitals:   05/15/22 0816  BP: 118/74  Pulse: 84  Temp: 98.4 F (36.9 C)  TempSrc: Temporal  SpO2: 96%  Weight: 194 lb 12.8 oz (88.4 kg)  Height: 5\' 2"  (1.575 m)     Physical Exam Vitals reviewed.  Constitutional:      Appearance: Normal appearance. She is well-developed.  HENT:     Head: Normocephalic and  atraumatic.  Eyes:     Conjunctiva/sclera: Conjunctivae normal.     Pupils: Pupils are equal, round, and reactive to light.  Neck:     Vascular: No carotid bruit.  Cardiovascular:     Rate and Rhythm: Normal rate and regular rhythm.     Heart sounds: Normal heart sounds.  Pulmonary:     Effort: Pulmonary effort is normal.     Breath sounds: Normal breath sounds.  Abdominal:     Palpations: Abdomen is soft. There is no pulsatile mass.     Tenderness: There is no abdominal tenderness.  Musculoskeletal:     Right lower leg: No edema.     Left lower leg: No edema.     Comments: Left knee, well-healed anterior scar.  Full extension, flexion to approximately 90 degrees.  No focal tenderness.  Skin:    General: Skin is warm and dry.  Neurological:     Mental Status: She is alert and oriented to person, place, and time.  Psychiatric:        Mood and Affect: Mood normal.        Behavior: Behavior normal.     Assessment & Plan:  Bryauna Byrum is a 77 y.o. female . Essential hypertension  -  Stable, tolerating current regimen. Medications refilled. Labs pending as above.  Recent CMP noted.  Prediabetes  -Followed by healthy weight wellness, recent A1c noted.  Continue to watch diet, activity.  Hypercholesterolemia - Plan: Lipid panel  -Tolerating Lipitor, continue same.  Check updated labs and adjust regimen accordingly.  Chronic pain of left knee - Plan: Ambulatory referral to Orthopedic Surgery History of left knee replacement - Plan: Ambulatory referral to Orthopedic Surgery  -Persistent numbness, discomfort, decreased motion, feels like losing some motion.  Plans on increased activity/travel, will refer to orthopedics to evaluate options, possible physical therapy.  Meds ordered this encounter  Medications   atorvastatin (LIPITOR) 20 MG tablet    Sig: Take 1 tablet (20 mg total) by mouth daily.    Dispense:  90 tablet    Refill:  3   lisinopril-hydrochlorothiazide  (ZESTORETIC) 10-12.5  MG tablet    Sig: Take 1 tablet by mouth daily.    Dispense:  90 tablet    Refill:  3   Patient Instructions  Thanks for coming in today.  I will refer you to Ortho to discuss options for your knee.  No med changes today.  I will let you know if any concerns on labs.  Take care    Signed,   Meredith Staggers, MD [facility], [facility] 05/15/22 9:14 AM

## 2022-05-18 ENCOUNTER — Encounter: Payer: Self-pay | Admitting: Family Medicine

## 2022-05-26 DIAGNOSIS — M25562 Pain in left knee: Secondary | ICD-10-CM | POA: Diagnosis not present

## 2022-06-18 ENCOUNTER — Encounter (INDEPENDENT_AMBULATORY_CARE_PROVIDER_SITE_OTHER): Payer: Self-pay | Admitting: Adult Health

## 2022-06-18 ENCOUNTER — Ambulatory Visit (INDEPENDENT_AMBULATORY_CARE_PROVIDER_SITE_OTHER): Payer: Medicare Other | Admitting: Adult Health

## 2022-06-18 VITALS — BP 125/81 | HR 86 | Temp 97.6°F | Ht 62.0 in | Wt 191.0 lb

## 2022-06-18 DIAGNOSIS — E7800 Pure hypercholesterolemia, unspecified: Secondary | ICD-10-CM | POA: Diagnosis not present

## 2022-06-18 DIAGNOSIS — E669 Obesity, unspecified: Secondary | ICD-10-CM

## 2022-06-18 DIAGNOSIS — Z6834 Body mass index (BMI) 34.0-34.9, adult: Secondary | ICD-10-CM | POA: Diagnosis not present

## 2022-06-18 DIAGNOSIS — R7303 Prediabetes: Secondary | ICD-10-CM

## 2022-06-18 MED ORDER — METFORMIN HCL 500 MG PO TABS: ORAL_TABLET | ORAL | 0 refills | Status: DC

## 2022-06-18 NOTE — Progress Notes (Signed)
WEIGHT SUMMARY AND BIOMETRICS  Vitals Temp: 97.6 F (36.4 C) BP: 125/81 Pulse Rate: 86 SpO2: 96 %   Anthropometric Measurements Height: 5\' 2"  (1.575 m) Weight: 191 lb (86.6 kg) BMI (Calculated): 34.93 Weight at Last Visit: 190 lb Weight Lost Since Last Visit: 0 lb Weight Gained Since Last Visit: 1 lb Starting Weight: 203 lb   Body Composition  Body Fat %: 47.7 % Fat Mass (lbs): 91.2 lbs Muscle Mass (lbs): 94.8 lbs Total Body Water (lbs): 74.8 lbs Visceral Fat Rating : 15   Other Clinical Data Fasting: Yes Labs: No Today's Visit #: 28 Starting Date: 01/30/20    Chief Complaint:   OBESITY Lori Mooney is here to discuss her progress with her obesity treatment plan. She is on the the Category 1 Plan and states she is following her eating plan approximately 80 % of the time.  She states she is exercising Low Impact Exercise Classes at [facility] 60 minutes 2 times per week.   Interim History:  05/15/2022 OV with PCP/Dr. Neva Seat- Labs completed and no changes to current medications  Hunger/appetite-She denies polyphagia  Exercise-Low Impact Exercise Classes at [facility] 60 minutes 2 times per week.  Current weight 191 lbs She would like to loss down to 177 lbs by Oct 2024, which is 14 lb weight loss and equivalent to 2.8 lbs weight loss per month.  Subjective:   1. Hypercholesterolemia Lipid Panel     Component Value Date/Time   CHOL 182 05/15/2022 0916   CHOL 200 (H) 07/15/2020 0842   TRIG 181.0 (H) 05/15/2022 0916   HDL 63.70 05/15/2022 0916   HDL 59 07/15/2020 0842   CHOLHDL 3 05/15/2022 0916   VLDL 36.2 05/15/2022 0916   LDLCALC 82 05/15/2022 0916   LDLCALC 104 (H) 07/15/2020 0842   LDLDIRECT 96.0 11/13/2021 0843   LABVLDL 37 07/15/2020 0842   Tgs reduced from 241 in Oct 2023 PCP managed daily Lipitor 20mg  She denies myalgias   2. Pre-diabetes Lab Results  Component Value Date   HGBA1C 5.8 (H) 03/24/2022   HGBA1C 6.2  11/13/2021   HGBA1C 5.8 (H) 07/01/2021  She is currently on daily Metformin 500mg - denies GI upset. She denies family hx of MEN 2 or MTC She denies personal hx of pancreatitis. Discussed risks/benefits of oral GLP-1 Therapy.  Research revealed Rybelsus would be cost prohibitive, prefers to remain on Metformin therapy.  Assessment/Plan:   1. Hypercholesterolemia Continue to limit saturated fat and increase cardiovascular exercise  2. Pre-diabetes Refill - metFORMIN (GLUCOPHAGE) 500 MG tablet; 1 tab with breakfast  Dispense: 90 tablet; Refill: 0  3. Obesity, current BMI 34.9  Crystalee is currently in the action stage of change. As such, her goal is to continue with weight loss efforts. She has agreed to the Category 1 Plan. 1000 cal/75g protein per day  Exercise goals: Older adults should follow the adult guidelines. When older adults cannot meet the adult guidelines, they should be as physically active as their abilities and conditions will allow.  Older adults should do exercises that maintain or improve balance if they are at risk of falling.   Behavioral modification strategies: increasing lean protein intake, decreasing simple carbohydrates, increasing vegetables, increasing water intake, no skipping meals, meal planning and cooking strategies, and planning for success.  Ellizabeth has agreed to follow-up with our clinic in 8 weeks. She was informed of the importance of frequent follow-up visits to maximize her success with intensive lifestyle modifications for  her multiple health conditions.   Objective:   Blood pressure 125/81, pulse 86, temperature 97.6 F (36.4 C), height 5\' 2"  (1.575 m), weight 191 lb (86.6 kg), SpO2 96 %. Body mass index is 34.93 kg/m.  General: Cooperative, alert, well developed, in no acute distress. HEENT: Conjunctivae and lids unremarkable. Cardiovascular: Regular rhythm.  Lungs: Normal work of breathing. Neurologic: No focal deficits.   Lab Results   Component Value Date   CREATININE 0.94 03/24/2022   BUN 19 03/24/2022   NA 140 03/24/2022   K 4.5 03/24/2022   CL 101 03/24/2022   CO2 21 03/24/2022   Lab Results  Component Value Date   ALT 17 03/24/2022   AST 15 03/24/2022   ALKPHOS 75 03/24/2022   BILITOT 0.6 03/24/2022   Lab Results  Component Value Date   HGBA1C 5.8 (H) 03/24/2022   HGBA1C 6.2 11/13/2021   HGBA1C 5.8 (H) 07/01/2021   HGBA1C 5.7 (H) 01/28/2021   HGBA1C 5.9 10/22/2020   Lab Results  Component Value Date   INSULIN 20.5 03/24/2022   INSULIN 20.4 07/01/2021   INSULIN 26.6 (H) 01/28/2021   INSULIN 23.6 07/15/2020   INSULIN 20.7 01/30/2020   Lab Results  Component Value Date   TSH 2.590 01/30/2020   Lab Results  Component Value Date   CHOL 182 05/15/2022   HDL 63.70 05/15/2022   LDLCALC 82 05/15/2022   LDLDIRECT 96.0 11/13/2021   TRIG 181.0 (H) 05/15/2022   CHOLHDL 3 05/15/2022   Lab Results  Component Value Date   VD25OH 63.2 03/24/2022   VD25OH 50.2 07/01/2021   VD25OH 52.0 01/28/2021   Lab Results  Component Value Date   WBC 6.0 10/22/2020   HGB 14.9 10/22/2020   HCT 45.0 10/22/2020   MCV 89.6 10/22/2020   PLT 273.0 10/22/2020   No results found for: "IRON", "TIBC", "FERRITIN"  Attestation Statements:   Reviewed by clinician on day of visit: allergies, medications, problem list, medical history, surgical history, family history, social history, and previous encounter notes.  I have reviewed the above documentation for accuracy and completeness, and I agree with the above. -  Chelsia Serres d. Michel Eskelson, NP-C

## 2022-08-12 ENCOUNTER — Other Ambulatory Visit: Payer: Self-pay | Admitting: Family Medicine

## 2022-08-12 ENCOUNTER — Encounter: Payer: Self-pay | Admitting: Family Medicine

## 2022-08-12 DIAGNOSIS — Z1231 Encounter for screening mammogram for malignant neoplasm of breast: Secondary | ICD-10-CM

## 2022-08-18 ENCOUNTER — Encounter (INDEPENDENT_AMBULATORY_CARE_PROVIDER_SITE_OTHER): Payer: Self-pay | Admitting: Adult Health

## 2022-08-18 ENCOUNTER — Ambulatory Visit (INDEPENDENT_AMBULATORY_CARE_PROVIDER_SITE_OTHER): Payer: Medicare Other | Admitting: Adult Health

## 2022-08-18 VITALS — BP 117/77 | HR 83 | Temp 98.0°F | Ht 62.0 in | Wt 192.0 lb

## 2022-08-18 DIAGNOSIS — Z6835 Body mass index (BMI) 35.0-35.9, adult: Secondary | ICD-10-CM | POA: Diagnosis not present

## 2022-08-18 DIAGNOSIS — R0602 Shortness of breath: Secondary | ICD-10-CM | POA: Diagnosis not present

## 2022-08-18 DIAGNOSIS — I10 Essential (primary) hypertension: Secondary | ICD-10-CM

## 2022-08-18 DIAGNOSIS — E559 Vitamin D deficiency, unspecified: Secondary | ICD-10-CM | POA: Diagnosis not present

## 2022-08-18 DIAGNOSIS — E669 Obesity, unspecified: Secondary | ICD-10-CM | POA: Diagnosis not present

## 2022-08-18 DIAGNOSIS — R7303 Prediabetes: Secondary | ICD-10-CM

## 2022-08-18 MED ORDER — WEGOVY 0.25 MG/0.5ML ~~LOC~~ SOAJ: 0.2500 mg | SUBCUTANEOUS | 0 refills | Status: DC

## 2022-08-18 NOTE — Progress Notes (Signed)
WEIGHT SUMMARY AND BIOMETRICS  Vitals Temp: 98 F (36.7 C) BP: 117/77 Pulse Rate: 83 SpO2: 97 %   Anthropometric Measurements Height: 5\' 2"  (1.575 m) Weight: 192 lb (87.1 kg) BMI (Calculated): 35.11 Weight at Last Visit: 191lb Weight Lost Since Last Visit: 0 Weight Gained Since Last Visit: 1lb Starting Weight: 203lb Total Weight Loss (lbs): 11 lb (4.99 kg)   Body Composition  Body Fat %: 47.8 % Fat Mass (lbs): 91.8 lbs Muscle Mass (lbs): 95.2 lbs Total Body Water (lbs): 73.6 lbs Visceral Fat Rating : 15   Other Clinical Data Fasting: Yes Labs: No Today's Visit #: 39 Starting Date: 01/30/20    Chief Complaint:   OBESITY Lori Mooney is here to discuss her progress with her obesity treatment plan. She is on the the Category 1 Plan and keeping a food journal and adhering to recommended goals of 1000-1100 calories and 85 protein and states she is following her eating plan approximately 80 % of the time. She states she is exercising Aerobics 45 minutes 3 times per week.   Interim History:  Reviewed Bioempedence results with pt: Muscle Mass: +0.4 lbs Adipose Mass: +0.6 lbs  She and her husband will celebrate 46 years of marriage  Her father will turn 100 on 11/30/2022!!!  Subjective:   1. SOB (shortness of breath) on exertion She endorses dyspnea with extreme exertion, denise CP  07/01/21 07:00  RMR 1325    04/21/22 07:00  RMR 1382   2. Essential hypertension BP at goal at OV She denies CP with exertion  3. Vitamin D deficiency  Latest Reference Range & Units 03/24/22 08:02  Vitamin D, 25-Hydroxy 30.0 - 100.0 ng/mL 63.2    4. Pre-diabetes Lab Results  Component Value Date   HGBA1C 5.8 (H) 03/24/2022   HGBA1C 6.2 11/13/2021   HGBA1C 5.8 (H) 07/01/2021   Lipid Panel     Component Value Date/Time   CHOL 182 05/15/2022 0916   CHOL 200 (H) 07/15/2020 0842   TRIG 181.0 (H) 05/15/2022 0916   HDL 63.70 05/15/2022 0916   HDL 59 07/15/2020 0842    CHOLHDL 3 05/15/2022 0916   VLDL 36.2 05/15/2022 0916   LDLCALC 82 05/15/2022 0916   LDLCALC 104 (H) 07/15/2020 0842   LDLDIRECT 96.0 11/13/2021 0843   LABVLDL 37 07/15/2020 0842   She is on daily Metformin 500mg  every day and Atorvastatin 20mg  Obesity + prediabets + HLD+ rec GLP-1 therapy She denies family hx of MNE 2 or MTC She denies personal hx pancreatitis  Assessment/Plan:   1. SOB (shortness of breath) on exertion Check IC in Oct 2024  2. Essential hypertension Check Labs - Comprehensive metabolic panel  3. Vitamin D deficiency Check Labs - VITAMIN D 25 Hydroxy (Vit-D Deficiency, Fractures)  4. Pre-diabetes Check Labs - Hemoglobin A1c - Insulin, random - Vitamin B12 Start Labs  6. Obesity, current BMI 35.13 Start Wegovy Semaglutide-Weight Management (WEGOVY) 0.25 MG/0.5ML SOAJ Inject 0.25 mg into the skin once a week. Dispense: 2 mL, Refills: 0 ordered   Lori Mooney is currently in the action stage of change. As such, her goal is to continue with weight loss efforts. She has agreed to the Category 1 Plan and keeping a food journal and adhering to recommended goals of 1000-1100 calories and 85 protein.   Exercise goals: Older adults should follow the adult guidelines. When older adults cannot meet the adult guidelines, they should be as physically active as their abilities and conditions will allow.  Older adults should do exercises that maintain or improve balance if they are at risk of falling.  Older adults with chronic conditions should understand whether and how their conditions affect their ability to do regular physical activity safely.  Behavioral modification strategies: increasing lean protein intake, decreasing simple carbohydrates, increasing vegetables, increasing water intake, no skipping meals, meal planning and cooking strategies, better snacking choices, planning for success, and keeping a strict food journal.  Lori Mooney has agreed to follow-up with our clinic  in 4 weeks. She was informed of the importance of frequent follow-up visits to maximize her success with intensive lifestyle modifications for her multiple health conditions.   Lori Mooney was informed we would discuss her lab results at her next visit unless there is a critical issue that needs to be addressed sooner. Lori Mooney agreed to keep her next visit at the agreed upon time to discuss these results.  Objective:   Blood pressure 117/77, pulse 83, temperature 98 F (36.7 C), height 5\' 2"  (1.575 m), weight 192 lb (87.1 kg), SpO2 97%. Body mass index is 35.12 kg/m.  General: Cooperative, alert, well developed, in no acute distress. HEENT: Conjunctivae and lids unremarkable. Cardiovascular: Regular rhythm.  Lungs: Normal work of breathing. Neurologic: No focal deficits.   Lab Results  Component Value Date   CREATININE 0.94 03/24/2022   BUN 19 03/24/2022   NA 140 03/24/2022   K 4.5 03/24/2022   CL 101 03/24/2022   CO2 21 03/24/2022   Lab Results  Component Value Date   ALT 17 03/24/2022   AST 15 03/24/2022   ALKPHOS 75 03/24/2022   BILITOT 0.6 03/24/2022   Lab Results  Component Value Date   HGBA1C 5.8 (H) 03/24/2022   HGBA1C 6.2 11/13/2021   HGBA1C 5.8 (H) 07/01/2021   HGBA1C 5.7 (H) 01/28/2021   HGBA1C 5.9 10/22/2020   Lab Results  Component Value Date   INSULIN 20.5 03/24/2022   INSULIN 20.4 07/01/2021   INSULIN 26.6 (H) 01/28/2021   INSULIN 23.6 07/15/2020   INSULIN 20.7 01/30/2020   Lab Results  Component Value Date   TSH 2.590 01/30/2020   Lab Results  Component Value Date   CHOL 182 05/15/2022   HDL 63.70 05/15/2022   LDLCALC 82 05/15/2022   LDLDIRECT 96.0 11/13/2021   TRIG 181.0 (H) 05/15/2022   CHOLHDL 3 05/15/2022   Lab Results  Component Value Date   VD25OH 63.2 03/24/2022   VD25OH 50.2 07/01/2021   VD25OH 52.0 01/28/2021   Lab Results  Component Value Date   WBC 6.0 10/22/2020   HGB 14.9 10/22/2020   HCT 45.0 10/22/2020   MCV 89.6  10/22/2020   PLT 273.0 10/22/2020   No results found for: "IRON", "TIBC", "FERRITIN"  Attestation Statements:   Reviewed by clinician on day of visit: allergies, medications, problem list, medical history, surgical history, family history, social history, and previous encounter notes.  I have reviewed the above documentation for accuracy and completeness, and I agree with the above. -  Omkar Stratmann d. Giovany Cosby, NP-C

## 2022-08-19 LAB — COMPREHENSIVE METABOLIC PANEL
ALT: 14 IU/L (ref 0–32)
BUN: 19 mg/dL (ref 8–27)
CO2: 22 mmol/L (ref 20–29)
Calcium: 10.3 mg/dL (ref 8.7–10.3)
Chloride: 102 mmol/L (ref 96–106)
Glucose: 87 mg/dL (ref 70–99)
Sodium: 141 mmol/L (ref 134–144)

## 2022-08-19 LAB — VITAMIN B12

## 2022-08-19 LAB — HEMOGLOBIN A1C: Est. average glucose Bld gHb Est-mCnc: 123 mg/dL

## 2022-08-19 LAB — INSULIN, RANDOM: INSULIN: 18.9 u[IU]/mL (ref 2.6–24.9)

## 2022-08-20 ENCOUNTER — Encounter (INDEPENDENT_AMBULATORY_CARE_PROVIDER_SITE_OTHER): Payer: Self-pay | Admitting: Adult Health

## 2022-08-20 LAB — COMPREHENSIVE METABOLIC PANEL
AST: 15 IU/L (ref 0–40)
Albumin: 4.8 g/dL (ref 3.8–4.8)
Alkaline Phosphatase: 79 IU/L (ref 44–121)
BUN/Creatinine Ratio: 26 (ref 12–28)
Bilirubin Total: 0.4 mg/dL (ref 0.0–1.2)
Creatinine, Ser: 0.72 mg/dL (ref 0.57–1.00)
Globulin, Total: 2.1 g/dL (ref 1.5–4.5)
Potassium: 4.6 mmol/L (ref 3.5–5.2)
Total Protein: 6.9 g/dL (ref 6.0–8.5)
eGFR: 87 mL/min/{1.73_m2} (ref >59)

## 2022-08-20 LAB — VITAMIN D 25 HYDROXY (VIT D DEFICIENCY, FRACTURES): Vit D, 25-Hydroxy: 53.4 ng/mL (ref 30.0–100.0)

## 2022-08-20 LAB — HEMOGLOBIN A1C: Hgb A1c MFr Bld: 5.9 % — ABNORMAL HIGH (ref 4.8–5.6)

## 2022-08-24 ENCOUNTER — Telehealth (INDEPENDENT_AMBULATORY_CARE_PROVIDER_SITE_OTHER): Payer: Self-pay | Admitting: Adult Health

## 2022-08-24 NOTE — Telephone Encounter (Signed)
PA for [facility] has been denied. Not covered under patients Medicare part D.

## 2022-09-17 ENCOUNTER — Ambulatory Visit (INDEPENDENT_AMBULATORY_CARE_PROVIDER_SITE_OTHER): Payer: Medicare Other | Admitting: Adult Health

## 2022-09-17 ENCOUNTER — Encounter (INDEPENDENT_AMBULATORY_CARE_PROVIDER_SITE_OTHER): Payer: Self-pay | Admitting: Adult Health

## 2022-09-17 VITALS — BP 103/70 | HR 79 | Temp 97.7°F | Ht 62.0 in | Wt 191.0 lb

## 2022-09-17 DIAGNOSIS — I10 Essential (primary) hypertension: Secondary | ICD-10-CM

## 2022-09-17 DIAGNOSIS — E7800 Pure hypercholesterolemia, unspecified: Secondary | ICD-10-CM | POA: Diagnosis not present

## 2022-09-17 DIAGNOSIS — M5432 Sciatica, left side: Secondary | ICD-10-CM | POA: Diagnosis not present

## 2022-09-17 DIAGNOSIS — E669 Obesity, unspecified: Secondary | ICD-10-CM

## 2022-09-17 DIAGNOSIS — E559 Vitamin D deficiency, unspecified: Secondary | ICD-10-CM | POA: Diagnosis not present

## 2022-09-17 DIAGNOSIS — R7303 Prediabetes: Secondary | ICD-10-CM

## 2022-09-17 DIAGNOSIS — Z6834 Body mass index (BMI) 34.0-34.9, adult: Secondary | ICD-10-CM | POA: Diagnosis not present

## 2022-09-17 MED ORDER — MELOXICAM 7.5 MG PO TABS: 7.5000 mg | ORAL_TABLET | Freq: Every day | ORAL | 0 refills | Status: AC

## 2022-09-17 MED ORDER — METFORMIN HCL 500 MG PO TABS: ORAL_TABLET | ORAL | 0 refills | Status: DC

## 2022-09-17 NOTE — Progress Notes (Signed)
WEIGHT SUMMARY AND BIOMETRICS  Vitals Temp: 97.7 F (36.5 C) BP: 103/70 Pulse Rate: 79 SpO2: 98 %   Anthropometric Measurements Height: 5\' 2"  (1.575 m) Weight: 191 lb (86.6 kg) BMI (Calculated): 34.93 Weight at Last Visit: 192lb Weight Lost Since Last Visit: 1lb Weight Gained Since Last Visit: 0 Starting Weight: 203lb Total Weight Loss (lbs): 12 lb (5.443 kg)   Body Composition  Body Fat %: 47.7 % Fat Mass (lbs): 91.4 lbs Muscle Mass (lbs): 95.2 lbs Total Body Water (lbs): 72.6 lbs Visceral Fat Rating : 15   Other Clinical Data Fasting: yes Labs: no Today's Visit #: 30 Starting Date: 01/30/20    Chief Complaint:   OBESITY Lori Mooney is here to discuss her progress with her obesity treatment plan. She is on the the Category 1 Plan and states she is following her eating plan approximately 50 % of the time. She states she is exercising Low Impact Aerobics 45 minutes 2 times per week.   Interim History:  Lori Mooney Rx denied- discussed how pt can initiate appeal process  3 weeks ago L sided sciatica pain developed- currently rated 4/10, described as aching, intermittent in nature. Pain localized over L ilium.  She is able to walk and participate in Low Impact Aerobics  Subjective:   1. Left sided sciatica Lori Mooney has hx of chronic back pain with L sided sciatica. 3 weeks ago L sided sciatica pain developed- currently rated 4/10, described as aching, intermittent in nature. Pain localized over L ilium. She denies acute injury/accident prior to onset of pain. She is using ice and OTC Acetaminiphen for pain control- only minimal relief. PCP has previously provided 90 day Rx of  Mobic 7.5mg    Latest Reference Range & Units 08/18/22 12:37  eGFR >59 mL/min/1.73 87   2. Hypercholesterolemia  Lipid Panel     Component Value Date/Time   CHOL 182 05/15/2022 0916   CHOL 200 (H) 07/15/2020 0842   TRIG 181.0 (H) 05/15/2022 0916   HDL 63.70 05/15/2022 0916    HDL 59 07/15/2020 0842   CHOLHDL 3 05/15/2022 0916   VLDL 36.2 05/15/2022 0916   LDLCALC 82 05/15/2022 0916   LDLCALC 104 (H) 07/15/2020 0842   LDLDIRECT 96.0 11/13/2021 0843   LABVLDL 37 07/15/2020 0842     3. Essential hypertension Discussed Labs BP at goal at OV She is on Lisinopril/hydrochlorothiazide 10/12.5mg  every day  08/18/2022 CMP- Electrolytes and Kidney fx both stable  4. Vitamin D deficiency Discussed Labs  Latest Reference Range & Units 08/18/22 12:37  Vitamin D, 25-Hydroxy 30.0 - 100.0 ng/mL 53.4  Vitamin B12 232 - 1,245 pg/mL 544   Levels stable She is on daily OTC Vit D3 1,000   5. Pre-diabetes Discussed Labs  Latest Reference Range & Units 08/18/22 12:37  Glucose 70 - 99 mg/dL 87  Hemoglobin W2N 4.8 - 5.6 % 5.9 (H)  Est. average glucose Bld gHb Est-mCnc mg/dL 562  INSULIN 2.6 - 13.0 uIU/mL 18.9  (H): Data is abnormally high  A1c worsened, Insulin level improved She is on daily Metformin 500mg  [employer] denied- discussed how to complete pt appeal process  Assessment/Plan:   1. Left sided sciatica ONE TIME REFILL  - meloxicam (MOBIC) 7.5 MG tablet; Take 1 tablet (7.5 mg total) by mouth daily.  Dispense: 30 tablet; Refill: 0  2. Hypercholesterolemia Continue daily statin therapy   3. Essential hypertension Remain well hydrated  4. Vitamin D deficiency Continue daily OTC Vit D3 1,000  5. Pre-diabetes Refill - metFORMIN (GLUCOPHAGE) 500 MG tablet; 1 tab with breakfast  Dispense: 90 tablet; Refill: 0  6. Obesity, current BMI 34.13 Appeal [id number] Denial- information provided to pt  Lori Mooney is currently in the action stage of change. As such, her goal is to continue with weight loss efforts. She has agreed to the Category 1 Plan.   Exercise goals: Older adults should follow the adult guidelines. When older adults cannot meet the adult guidelines, they should be as physically active as their abilities and conditions will allow.  Older adults should do  exercises that maintain or improve balance if they are at risk of falling.  Older adults should determine their level of effort for physical activity relative to their level of fitness.   Behavioral modification strategies: increasing lean protein intake, decreasing simple carbohydrates, increasing vegetables, increasing water intake, meal planning and cooking strategies, keeping healthy foods in the home, and planning for success.  Lori Mooney has agreed to follow-up with our clinic in 8 weeks. She was informed of the importance of frequent follow-up visits to maximize her success with intensive lifestyle modifications for her multiple health conditions.   Objective:   Blood pressure 103/70, pulse 79, temperature 97.7 F (36.5 C), height 5\' 2"  (1.575 m), weight 191 lb (86.6 kg), SpO2 98%. Body mass index is 34.93 kg/m.  General: Cooperative, alert, well developed, in no acute distress. HEENT: Conjunctivae and lids unremarkable. Cardiovascular: Regular rhythm.  Lungs: Normal work of breathing. Neurologic: No focal deficits.   Lab Results  Component Value Date   CREATININE 0.72 08/18/2022   BUN 19 08/18/2022   NA 141 08/18/2022   K 4.6 08/18/2022   CL 102 08/18/2022   CO2 22 08/18/2022   Lab Results  Component Value Date   ALT 14 08/18/2022   AST 15 08/18/2022   ALKPHOS 79 08/18/2022   BILITOT 0.4 08/18/2022   Lab Results  Component Value Date   HGBA1C 5.9 (H) 08/18/2022   HGBA1C 5.8 (H) 03/24/2022   HGBA1C 6.2 11/13/2021   HGBA1C 5.8 (H) 07/01/2021   HGBA1C 5.7 (H) 01/28/2021   Lab Results  Component Value Date   INSULIN 18.9 08/18/2022   INSULIN 20.5 03/24/2022   INSULIN 20.4 07/01/2021   INSULIN 26.6 (H) 01/28/2021   INSULIN 23.6 07/15/2020   Lab Results  Component Value Date   TSH 2.590 01/30/2020   Lab Results  Component Value Date   CHOL 182 05/15/2022   HDL 63.70 05/15/2022   LDLCALC 82 05/15/2022   LDLDIRECT 96.0 11/13/2021   TRIG 181.0 (H) 05/15/2022    CHOLHDL 3 05/15/2022   Lab Results  Component Value Date   VD25OH 53.4 08/18/2022   VD25OH 63.2 03/24/2022   VD25OH 50.2 07/01/2021   Lab Results  Component Value Date   WBC 6.0 10/22/2020   HGB 14.9 10/22/2020   HCT 45.0 10/22/2020   MCV 89.6 10/22/2020   PLT 273.0 10/22/2020   No results found for: "IRON", "TIBC", "FERRITIN"  Attestation Statements:   Reviewed by clinician on day of visit: allergies, medications, problem list, medical history, surgical history, family history, social history, and previous encounter notes.  I have reviewed the above documentation for accuracy and completeness, and I agree with the above. -  Lori Mooney d. Ariyah Sedlack, NP-C

## 2022-10-15 ENCOUNTER — Telehealth (INDEPENDENT_AMBULATORY_CARE_PROVIDER_SITE_OTHER): Payer: Self-pay

## 2022-10-15 NOTE — Telephone Encounter (Signed)
PA submitted for [facility], waiting on a determination. 7:07 10/15/22 KP

## 2022-10-15 NOTE — Telephone Encounter (Signed)
PA for [facility] received an immediate reply and it is as follows:   Information regarding your request [facility] is not able to process this request because the previous Prior Authorization Appeal Request was Denied, please contact [employer]. at [phone] or fax in expedited request to [phone] and standard request to [phone].

## 2022-10-20 ENCOUNTER — Ambulatory Visit
Admission: RE | Admit: 2022-10-20 | Discharge: 2022-10-20 | Disposition: A | Discharge status: Home / Self Care | Payer: Medicare Other | Source: Ambulatory Visit | Attending: Family Medicine | Admitting: Family Medicine

## 2022-10-20 DIAGNOSIS — Z1231 Encounter for screening mammogram for malignant neoplasm of breast: Secondary | ICD-10-CM | POA: Diagnosis not present

## 2022-10-22 ENCOUNTER — Ambulatory Visit (INDEPENDENT_AMBULATORY_CARE_PROVIDER_SITE_OTHER): Payer: Medicare Other | Admitting: Family Medicine

## 2022-10-22 ENCOUNTER — Other Ambulatory Visit: Payer: Self-pay | Admitting: Family Medicine

## 2022-10-22 ENCOUNTER — Ambulatory Visit
Admission: RE | Admit: 2022-10-22 | Discharge: 2022-10-22 | Disposition: A | Discharge status: Home / Self Care | Payer: Medicare Other | Source: Ambulatory Visit | Attending: Family Medicine | Admitting: Family Medicine

## 2022-10-22 DIAGNOSIS — R921 Mammographic calcification found on diagnostic imaging of breast: Secondary | ICD-10-CM | POA: Diagnosis not present

## 2022-10-22 DIAGNOSIS — Z23 Encounter for immunization: Secondary | ICD-10-CM | POA: Diagnosis not present

## 2022-10-22 DIAGNOSIS — R928 Other abnormal and inconclusive findings on diagnostic imaging of breast: Secondary | ICD-10-CM

## 2022-10-22 NOTE — Progress Notes (Signed)
Pt was given Influenza High Dose in left deltoid- No concerns

## 2022-10-23 ENCOUNTER — Other Ambulatory Visit: Payer: Self-pay | Admitting: Family Medicine

## 2022-10-23 DIAGNOSIS — R921 Mammographic calcification found on diagnostic imaging of breast: Secondary | ICD-10-CM

## 2022-11-10 ENCOUNTER — Ambulatory Visit (INDEPENDENT_AMBULATORY_CARE_PROVIDER_SITE_OTHER): Payer: Medicare Other | Admitting: Adult Health

## 2022-11-10 ENCOUNTER — Encounter (INDEPENDENT_AMBULATORY_CARE_PROVIDER_SITE_OTHER): Payer: Self-pay | Admitting: Adult Health

## 2022-11-10 VITALS — BP 115/75 | HR 75 | Temp 97.5°F | Ht 62.0 in | Wt 192.0 lb

## 2022-11-10 DIAGNOSIS — E559 Vitamin D deficiency, unspecified: Secondary | ICD-10-CM

## 2022-11-10 DIAGNOSIS — Z6835 Body mass index (BMI) 35.0-35.9, adult: Secondary | ICD-10-CM

## 2022-11-10 DIAGNOSIS — E669 Obesity, unspecified: Secondary | ICD-10-CM

## 2022-11-10 DIAGNOSIS — R0602 Shortness of breath: Secondary | ICD-10-CM

## 2022-11-10 DIAGNOSIS — R7303 Prediabetes: Secondary | ICD-10-CM | POA: Diagnosis not present

## 2022-11-10 NOTE — Progress Notes (Signed)
WEIGHT SUMMARY AND BIOMETRICS  Vitals Temp: (!) 97.5 F (36.4 C) BP: 115/75 Pulse Rate: 75 SpO2: 96 %   Anthropometric Measurements Height: 5\' 2"  (1.575 m) Weight: 192 lb (87.1 kg) BMI (Calculated): 35.11 Weight at Last Visit: 191lb Weight Lost Since Last Visit: 0 Weight Gained Since Last Visit: 1lb Starting Weight: 203lb Total Weight Loss (lbs): 11 lb (4.99 kg)   Body Composition  Body Fat %: 47.7 % Fat Mass (lbs): 91.6 lbs Muscle Mass (lbs): 95.4 lbs Total Body Water (lbs): 72.4 lbs Visceral Fat Rating : 16   Other Clinical Data RMR: 1138 Fasting: yes Labs: yes Today's Visit #: 31 Starting Date: 01/30/20    Chief Complaint:   OBESITY Lori Mooney is here to discuss her progress with her obesity treatment plan. She is on the the Category 1 Plan and states she is following her eating plan approximately 70 % of the time. She states she is exercising Low impact aerobics 45-60 minutes 2 times per week.   Interim History:  She celebrated her 62th birthday at [facility] Her father turns 100 on 11/30/2022! She anticipates 150 people will attend his birthday party!  Exercise-limited by chronic osteoarthritis issues.  Hydration-she estimates to drink 12 glasses of water/day  Subjective:   1. Vitamin D deficiency  Latest Reference Range & Units 03/24/22 08:02 08/18/22 12:37  Vitamin D, 25-Hydroxy 30.0 - 100.0 ng/mL 63.2 53.4   She is on daily OTC Vit D 3 1,000 international units  2. SOB (shortness of breath) on exertion She endorse dyspnea with extreme exertion- denies CP  01/30/20 09:21  RMR 1306  Waist Measurement  45 inches    07/15/20 07:34  RMR 1759     07/01/21 07:00  RMR 1325    04/21/22 07:00  RMR 1382    11/10/22 07:00  RMR 1138   Metabolism below goal and lowest rate ever.  3.Pre-diabetes Lab Results  Component Value Date   HGBA1C 5.9 (H) 08/18/2022   HGBA1C 5.8 (H) 03/24/2022   HGBA1C 6.2 11/13/2021    [place]eg[place] is cost  prohibitve She has remained on Metformin 500mg  at breakfast She denies GI upset  Assessment/Plan:   1. Vitamin D deficiency Check Labs - VITAMIN D 25 Hydroxy (Vit-D Deficiency, Fractures)  2. SOB (shortness of breath) on exertion Continue Cat 1 meal plan  3. Pre-diabetes Check Labs - Insulin, random  4. Obesity, current BMI 35.11  Lori Mooney is currently in the action stage of change. As such, her goal is to continue with weight loss efforts. She has agreed to the Category 1 Plan.   Exercise goals: Older adults should follow the adult guidelines. When older adults cannot meet the adult guidelines, they should be as physically active as their abilities and conditions will allow.  Older adults should do exercises that maintain or improve balance if they are at risk of falling.  Older adults should determine their level of effort for physical activity relative to their level of fitness.  Older adults with chronic conditions should understand whether and how their conditions affect their ability to do regular physical activity safely.  Behavioral modification strategies: increasing lean protein intake, decreasing simple carbohydrates, increasing vegetables, increasing water intake, no skipping meals, meal planning and cooking strategies, and planning for success.  Lori Mooney has agreed to follow-up with our clinic in 8 weeks. She was informed of the importance of frequent follow-up visits to maximize her success with intensive lifestyle modifications for her multiple health conditions.  Lori Mooney was informed we would discuss her lab results at her next visit unless there is a critical issue that needs to be addressed sooner. Lori Mooney agreed to keep her next visit at the agreed upon time to discuss these results.  Objective:   Blood pressure 115/75, pulse 75, temperature (!) 97.5 F (36.4 C), height 5\' 2"  (1.575 m), weight 192 lb (87.1 kg), SpO2 96%. Body mass index is 35.12 kg/m.  General:  Cooperative, alert, well developed, in no acute distress. HEENT: Conjunctivae and lids unremarkable. Cardiovascular: Regular rhythm.  Lungs: Normal work of breathing. Neurologic: No focal deficits.   Lab Results  Component Value Date   CREATININE 0.72 08/18/2022   BUN 19 08/18/2022   NA 141 08/18/2022   K 4.6 08/18/2022   CL 102 08/18/2022   CO2 22 08/18/2022   Lab Results  Component Value Date   ALT 14 08/18/2022   AST 15 08/18/2022   ALKPHOS 79 08/18/2022   BILITOT 0.4 08/18/2022   Lab Results  Component Value Date   HGBA1C 5.9 (H) 08/18/2022   HGBA1C 5.8 (H) 03/24/2022   HGBA1C 6.2 11/13/2021   HGBA1C 5.8 (H) 07/01/2021   HGBA1C 5.7 (H) 01/28/2021   Lab Results  Component Value Date   INSULIN 18.9 08/18/2022   INSULIN 20.5 03/24/2022   INSULIN 20.4 07/01/2021   INSULIN 26.6 (H) 01/28/2021   INSULIN 23.6 07/15/2020   Lab Results  Component Value Date   TSH 2.590 01/30/2020   Lab Results  Component Value Date   CHOL 182 05/15/2022   HDL 63.70 05/15/2022   LDLCALC 82 05/15/2022   LDLDIRECT 96.0 11/13/2021   TRIG 181.0 (H) 05/15/2022   CHOLHDL 3 05/15/2022   Lab Results  Component Value Date   VD25OH 53.4 08/18/2022   VD25OH 63.2 03/24/2022   VD25OH 50.2 07/01/2021   Lab Results  Component Value Date   WBC 6.0 10/22/2020   HGB 14.9 10/22/2020   HCT 45.0 10/22/2020   MCV 89.6 10/22/2020   PLT 273.0 10/22/2020   No results found for: "IRON", "TIBC", "FERRITIN"  Attestation Statements:   Reviewed by clinician on day of visit: allergies, medications, problem list, medical history, surgical history, family history, social history, and previous encounter notes.  I have reviewed the above documentation for accuracy and completeness, and I agree with the above. -  Mckinzi Eriksen d. Kordell Jafri, NP-C

## 2022-11-11 ENCOUNTER — Telehealth: Payer: Self-pay | Admitting: Family Medicine

## 2022-11-11 NOTE — Telephone Encounter (Signed)
Placed in the front of your sign folder, this is a STAT request

## 2022-11-11 NOTE — Telephone Encounter (Signed)
Mackenzie gave to [place] to sign and will be faxed right back

## 2022-11-11 NOTE — Telephone Encounter (Signed)
Paperwork completed and placed in fax bin at back nurse station

## 2022-11-12 LAB — VITAMIN D 25 HYDROXY (VIT D DEFICIENCY, FRACTURES): Vit D, 25-Hydroxy: 51.5 ng/mL (ref 30.0–100.0)

## 2022-11-12 LAB — INSULIN, RANDOM: INSULIN: 19.7 u[IU]/mL (ref 2.6–24.9)

## 2022-11-19 ENCOUNTER — Encounter: Payer: Self-pay | Admitting: Family Medicine

## 2022-11-19 ENCOUNTER — Ambulatory Visit: Payer: Medicare Other | Admitting: Family Medicine

## 2022-11-19 VITALS — BP 122/82 | HR 90 | Temp 98.3°F | Ht 62.0 in

## 2022-11-19 DIAGNOSIS — Z6837 Body mass index (BMI) 37.0-37.9, adult: Secondary | ICD-10-CM

## 2022-11-19 DIAGNOSIS — I10 Essential (primary) hypertension: Secondary | ICD-10-CM | POA: Diagnosis not present

## 2022-11-19 DIAGNOSIS — E66812 Obesity, class 2: Secondary | ICD-10-CM

## 2022-11-19 DIAGNOSIS — R7303 Prediabetes: Secondary | ICD-10-CM

## 2022-11-19 DIAGNOSIS — E7800 Pure hypercholesterolemia, unspecified: Secondary | ICD-10-CM

## 2022-11-19 LAB — COMPREHENSIVE METABOLIC PANEL
ALT: 13 U/L (ref 0–35)
AST: 16 U/L (ref 0–37)
Albumin: 4.7 g/dL (ref 3.5–5.2)
Alkaline Phosphatase: 62 U/L (ref 39–117)
BUN: 17 mg/dL (ref 6–23)
CO2: 28 meq/L (ref 19–32)
Calcium: 10.5 mg/dL (ref 8.4–10.5)
Chloride: 102 meq/L (ref 96–112)
Creatinine, Ser: 0.93 mg/dL (ref 0.40–1.20)
GFR: 59.48 mL/min — ABNORMAL LOW (ref >60.00)
Glucose, Bld: 90 mg/dL (ref 70–99)
Potassium: 4.1 meq/L (ref 3.5–5.1)
Sodium: 139 meq/L (ref 135–145)
Total Bilirubin: 0.6 mg/dL (ref 0.2–1.2)
Total Protein: 7.4 g/dL (ref 6.0–8.3)

## 2022-11-19 LAB — LIPID PANEL
Cholesterol: 182 mg/dL (ref 0–200)
HDL: 62.7 mg/dL (ref >39.00)
LDL Cholesterol: 76 mg/dL (ref 0–99)
NonHDL: 119.16
Total CHOL/HDL Ratio: 3
Triglycerides: 216 mg/dL — ABNORMAL HIGH (ref 0.0–149.0)
VLDL: 43.2 mg/dL — ABNORMAL HIGH (ref 0.0–40.0)

## 2022-11-19 LAB — HEMOGLOBIN A1C: Hgb A1c MFr Bld: 5.9 % (ref 4.6–6.5)

## 2022-11-19 NOTE — Progress Notes (Signed)
Subjective:  Patient ID: Lori Mooney, female    DOB: 06/15/1945  Age: 77 y.o. MRN: [id number]  CC:  Chief Complaint  Patient presents with   Medical Management of Chronic Issues    Blood work check up     HPI Lori Mooney presents for   Hypertension: Lisinopril HCTZ 10/12.5 mg daily. No new side effects.  Home readings: 117-123/80.  BP Readings from Last 3 Encounters:  11/19/22 122/82  11/10/22 115/75  09/17/22 103/70   Lab Results  Component Value Date   CREATININE 0.72 08/18/2022    Hyperlipidemia: Lipitor 20 mg daily without new myalgias or side effects.  Lab Results  Component Value Date   CHOL 182 05/15/2022   HDL 63.70 05/15/2022   LDLCALC 82 05/15/2022   LDLDIRECT 96.0 11/13/2021   TRIG 181.0 (H) 05/15/2022   CHOLHDL 3 05/15/2022   Lab Results  Component Value Date   ALT 14 08/18/2022   AST 15 08/18/2022   ALKPHOS 79 08/18/2022   BILITOT 0.4 08/18/2022    Prediabetes: Treated with metformin, followed by healthy weight and wellness.  Weight stable past few months.  No new side effects with medication.  Last visit with healthy weight and wellness on October 15. Plateau in weight. [facility] cost prohibitive.  Lab Results  Component Value Date   HGBA1C 5.9 (H) 08/18/2022   Wt Readings from Last 3 Encounters:  11/10/22 192 lb (87.1 kg)  09/17/22 191 lb (86.6 kg)  08/18/22 192 lb (87.1 kg)    History Patient Active Problem List   Diagnosis Date Noted   Osteopenia 05/09/2021   Status post colonoscopy 09/26/2020   Insulin resistance 02/28/2020   Other hyperlipidemia 02/14/2020   Essential hypertension 01/30/2020   Hypertriglyceridemia 01/30/2020   Prediabetes 01/30/2020   Osteoarthritis 01/30/2020   Vitamin D deficiency 01/30/2020   H/O total knee replacement, left 08/24/2018   Osteoarthritis of spine with radiculopathy, lumbar region 06/25/2017   Back pain with sciatica 06/25/2017   Spinal stenosis of lumbar region without  neurogenic claudication 06/25/2017   GERD (gastroesophageal reflux disease) 09/19/2013   Vertigo 03/02/2012   Hypercholesterolemia 10/08/2010   Benign hypertensive heart disease without heart failure 10/08/2010   Class 2 severe obesity with serious comorbidity and body mass index (BMI) of 37.0 to 37.9 in adult [facility]) 10/08/2010   Past Medical History:  Diagnosis Date   Allergy    Arthritis    history of arthritis in the fingers   Cataract    Constipation    DDD (degenerative disc disease), lumbar    Exogenous obesity    GERD (gastroesophageal reflux disease)    Heartburn    Hypercholesterolemia    Hyperlipidemia    hypercholesterolemia   Hypertension    Hypertriglyceridemia    Osteoarthritis    Palpitations    Prediabetes    SOB (shortness of breath) on exertion    Swelling of both lower extremities    Past Surgical History:  Procedure Laterality Date   CATARACT EXTRACTION W/ INTRAOCULAR LENS IMPLANT Left    DILITATION & CURRETTAGE/HYSTROSCOPY WITH VERSAPOINT RESECTION N/A 08/25/2012   Procedure: DILATATION & CURETTAGE/HYSTEROSCOPY WITH VERSAPOINT RESECTION;  Surgeon: Genia Del, MD;  Location: [facility];  Service: Gynecology;  Laterality: N/A;   EYE SURGERY     LUMBAR EPIDURAL INJECTION  08/04/2017   TONSILLECTOMY     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   Procedure: TOTAL KNEE ARTHROPLASTY;  Surgeon: Ranee Gosselin, MD;  Location: [facility];  Service: Orthopedics;  Laterality: Left;    TUBAL LIGATION     Allergies  Allergen Reactions   Shellfish Allergy Hives   Strawberry Extract Hives   Prior to Admission medications   Medication Sig Start Date End Date Taking? Authorizing Provider  acetaminophen (TYLENOL) 325 MG tablet Take 650 mg by mouth every 6 (six) hours as needed.   Yes [provider]  atorvastatin (LIPITOR) 20 MG tablet Take 1 tablet (20 mg total) by mouth daily. 05/15/22  Yes Shade Flood, MD  Biotin w/ Vitamins C & E  (HAIR/SKIN/NAILS PO) Take by mouth. Natures Bounty   Yes [provider]  Cholecalciferol (VITAMIN D-3) 1000 UNITS CAPS Take 1,000 Units by mouth daily.   Yes [provider]  famotidine (PEPCID) 20 MG tablet 10 mg daily. 09/15/18  Yes [provider]  lisinopril-hydrochlorothiazide (ZESTORETIC) 10-12.5 MG tablet Take 1 tablet by mouth daily. 05/15/22  Yes Shade Flood, MD  loratadine (CLARITIN) 10 MG tablet Take 10 mg by mouth daily as needed for allergies.    Yes [provider]  magnesium 30 MG tablet Take 250 mg by mouth daily.    Yes [provider]  meloxicam (MOBIC) 7.5 MG tablet Take 1 tablet (7.5 mg total) by mouth daily. 09/17/22  Yes Danford, Orpha Bur D, NP  metFORMIN (GLUCOPHAGE) 500 MG tablet 1 tab with breakfast 09/17/22  Yes Danford, Jinny Blossom, NP   Social History   Socioeconomic History   Marital status: Married    Spouse name: Lori Mooney   Number of children: 2   Years of education: Not on file   Highest education level: Bachelor's degree (e.g., BA, AB, BS)  Occupational History   Occupation: Retired Charity fundraiser  Tobacco Use   Smoking status: Never   Smokeless tobacco: Never  Vaping Use   Vaping status: Never Used  Substance and Sexual Activity   Alcohol use: Yes    Alcohol/week: 1.0 - 2.0 standard drink of alcohol    Types: 1 - 2 Standard drinks or equivalent per week   Drug use: No   Sexual activity: Yes  Other Topics Concern   Not on file  Social History Narrative   Married. Education: [employer].   Social Determinants of Health   Financial Resource Strain: Low Risk  (11/15/2022)   Overall Financial Resource Strain (CARDIA)    Difficulty of Paying Living Expenses: Not hard at all  Food Insecurity: No Food Insecurity (11/15/2022)   Hunger Vital Sign    Worried About Running Out of Food in the Last Year: Never true    Ran Out of Food in the Last Year: Never true  Transportation Needs: No Transportation Needs (11/15/2022)    PRAPARE - Administrator, Civil Service (Medical): No    Lack of Transportation (Non-Medical): No  Physical Activity: Sufficiently Active (11/15/2022)   Exercise Vital Sign    Days of Exercise per Week: 3 days    Minutes of Exercise per Session: 50 min  Stress: No Stress Concern Present (11/15/2022)   [employer] of Occupational Health - Occupational Stress Questionnaire    Feeling of Stress : Not at all  Social Connections: Socially Integrated (11/15/2022)   Social Connection and Isolation Panel [NHANES]    Frequency of Communication with Friends and Family: Three times a week    Frequency of Social Gatherings with Friends and Family: Three times a week    Attends Religious Services: More than 4 times per year    Active  Member of Clubs or Organizations: Yes    Attends Engineer, structural: More than 4 times per year    Marital Status: Married  Catering manager Violence: Not At Risk (03/17/2022)   Humiliation, Afraid, Rape, and Kick questionnaire    Fear of Current or Ex-Partner: No    Emotionally Abused: No    Physically Abused: No    Sexually Abused: No    Review of Systems  Constitutional:  Negative for fatigue and unexpected weight change.  Respiratory:  Negative for chest tightness and shortness of breath.   Cardiovascular:  Negative for chest pain, palpitations and leg swelling.  Gastrointestinal:  Negative for abdominal pain and blood in stool.  Neurological:  Negative for dizziness, syncope, light-headedness and headaches.     Objective:   Vitals:   11/19/22 0803  BP: 122/82  Pulse: 90  Temp: 98.3 F (36.8 C)  TempSrc: Oral  SpO2: 95%  Height: 5\' 2"  (1.575 m)  Body mass index is 35.12 kg/m.    Physical Exam Vitals reviewed.  Constitutional:      Appearance: Normal appearance. She is well-developed.  HENT:     Head: Normocephalic and atraumatic.  Eyes:     Conjunctiva/sclera: Conjunctivae normal.     Pupils: Pupils are equal,  round, and reactive to light.  Neck:     Vascular: No carotid bruit.  Cardiovascular:     Rate and Rhythm: Normal rate and regular rhythm.     Heart sounds: Normal heart sounds.  Pulmonary:     Effort: Pulmonary effort is normal.     Breath sounds: Normal breath sounds.  Abdominal:     Palpations: Abdomen is soft. There is no pulsatile mass.     Tenderness: There is no abdominal tenderness.  Musculoskeletal:     Right lower leg: No edema.     Left lower leg: No edema.  Skin:    General: Skin is warm and dry.  Neurological:     Mental Status: She is alert and oriented to person, place, and time.  Psychiatric:        Mood and Affect: Mood normal.        Behavior: Behavior normal.     Assessment & Plan:  Orlena Othman is a 77 y.o. female . Pre-diabetes - Plan: Hemoglobin A1c Obesity, current BMI 35.11  -Continue follow-up and treatment with healthy weight and wellness, tolerated metformin, continue same, check updated A1c.  Essential hypertension - Plan: Comprehensive metabolic panel  -Tolerating lisinopril HCTZ, continue same dose with labs as above.  Hypercholesterolemia - Plan: Lipid panel  -Tolerating statin, continue same.  Check updated labs.  Mild hypertriglyceridemia previously, but may be related to hyperglycemia/prediabetes.  No changes at this time.  HM:  Declines covid booster.   No orders of the defined types were placed in this encounter.  Patient Instructions  Thanks for coming in today.  No medication changes at this time.  If any concerns on labs I will let you know.  Have a great trip to [place] and take care.    Signed,   Meredith Staggers, MD [facility], [facility] 11/19/22 8:29 AM

## 2022-11-19 NOTE — Patient Instructions (Signed)
Thanks for coming in today.  No medication changes at this time.  If any concerns on labs I will let you know.  Have a great trip to [place] and take care.

## 2023-01-12 ENCOUNTER — Ambulatory Visit (INDEPENDENT_AMBULATORY_CARE_PROVIDER_SITE_OTHER): Payer: Medicare Other | Admitting: Adult Health

## 2023-01-12 ENCOUNTER — Encounter (INDEPENDENT_AMBULATORY_CARE_PROVIDER_SITE_OTHER): Payer: Self-pay | Admitting: Adult Health

## 2023-01-12 VITALS — BP 128/69 | HR 95 | Temp 98.3°F | Ht 62.0 in | Wt 190.0 lb

## 2023-01-12 DIAGNOSIS — E669 Obesity, unspecified: Secondary | ICD-10-CM

## 2023-01-12 DIAGNOSIS — I10 Essential (primary) hypertension: Secondary | ICD-10-CM | POA: Diagnosis not present

## 2023-01-12 DIAGNOSIS — R7303 Prediabetes: Secondary | ICD-10-CM

## 2023-01-12 DIAGNOSIS — E66812 Obesity, class 2: Secondary | ICD-10-CM

## 2023-01-12 DIAGNOSIS — E559 Vitamin D deficiency, unspecified: Secondary | ICD-10-CM

## 2023-01-12 DIAGNOSIS — E7800 Pure hypercholesterolemia, unspecified: Secondary | ICD-10-CM

## 2023-01-12 DIAGNOSIS — Z6834 Body mass index (BMI) 34.0-34.9, adult: Secondary | ICD-10-CM

## 2023-01-12 MED ORDER — METFORMIN HCL 500 MG PO TABS
ORAL_TABLET | ORAL | 0 refills | Status: DC
Start: 2023-01-12 — End: 2023-04-06

## 2023-01-12 NOTE — Progress Notes (Signed)
WEIGHT SUMMARY AND BIOMETRICS  Vitals Temp: 98.3 F (36.8 C) BP: 128/69 Pulse Rate: 95 SpO2: 96 %   Anthropometric Measurements Height: 5\' 2"  (1.575 m) Weight: 190 lb (86.2 kg) BMI (Calculated): 34.74 Weight at Last Visit: 192 lb Weight Lost Since Last Visit: 2 lb Weight Gained Since Last Visit: 0 Starting Weight: 203 lb Total Weight Loss (lbs): 13 lb (5.897 kg)   Body Composition  Body Fat %: 47.5 % Fat Mass (lbs): 90.4 lbs Muscle Mass (lbs): 94.8 lbs Total Body Water (lbs): 73.4 lbs Visceral Fat Rating : 15   Other Clinical Data Fasting: yes Labs: no Today's Visit #: 32 Starting Date: 01/30/20    Chief Complaint:   OBESITY Lori Mooney is here to discuss her progress with her obesity treatment plan. She is on the keeping a food journal and adhering to recommended goals of 1000 calories and 85-90 protein and states she is following her eating plan approximately 75 % of the time. She states she is exercising Low Impact Aerobics 45 minutes 2 times per week.   Interim History:  SInce last OV at [facility] on 11/10/2022 1) Celebrated her father's 89th birthday! 2) Celebrated Halloween and Thanksgiving  She is thrilled to have lost 2 lbs!   Exercise-Low impact Aerobics  Hydration-she estimated to drink at least 8 glasses of water/day She may have a one diet/caffiene free soda several times a day  Subjective:   1. Pre-diabetes Discussed Labs Lab Results  Component Value Date   HGBA1C 5.9 11/19/2022   HGBA1C 5.9 (H) 08/18/2022   HGBA1C 5.8 (H) 03/24/2022     Latest Reference Range & Units 03/24/22 08:02 08/18/22 12:37 11/10/22 10:21  INSULIN 2.6 - 24.9 uIU/mL 20.5 18.9 19.7    Latest Reference Range & Units 11/19/22 08:33  GFR >60.00 mL/min 59.48 (L)  (L): Data is abnormally low  GFR stable and even improved from last check A1c still in prediabetic range and Insulin still quite elevated She is on daily Metformin 500mg - denies GI upset  2.  Hypercholesterolemia Discussed Labs Lipid Panel     Component Value Date/Time   CHOL 182 11/19/2022 0833   CHOL 200 (H) 07/15/2020 0842   TRIG 216.0 (H) 11/19/2022 0833   HDL 62.70 11/19/2022 0833   HDL 59 07/15/2020 0842   CHOLHDL 3 11/19/2022 0833   VLDL 43.2 (H) 11/19/2022 0833   LDLCALC 76 11/19/2022 0833   LDLCALC 104 (H) 07/15/2020 0842   LDLDIRECT 96.0 11/13/2021 0843   LABVLDL 37 07/15/2020 0842    She reports family hx of Hypertriglyceridemia (her mother) Total, HDL, LDL levels stable  3. Essential hypertension Discussed Labs BP at goal at OV She is on daily Zestoretic 10/12.5mg  11/19/2022 CMP- Electrolytes, kidney/liver fx- stable  4. Vitamin D deficiency Discussed Labs  Latest Reference Range & Units 11/10/22 10:21  Vitamin D, 25-Hydroxy 30.0 - 100.0 ng/mL 51.5   Vit D level at goal She believes that she is on OTC Vit D 3 2,000 international units  Assessment/Plan:   1. Pre-diabetes (Primary) Refill  2. Hypercholesterolemia Continue Lipitor and limiting sugar/simple CHO  3. Essential hypertension Continue daily Zestoretic 10/12.5mg   4. Vitamin D deficiency Continue OTC Vit D supplementation  5. Obesity, current BMI 34.74  Lori Mooney is currently in the action stage of change. As such, her goal is to continue with weight loss efforts. She has agreed to keeping a food journal and adhering to recommended goals of 1000 calories and 85-90 protein.  Exercise goals: Older adults should follow the adult guidelines. When older adults cannot meet the adult guidelines, they should be as physically active as their abilities and conditions will allow.  Older adults should do exercises that maintain or improve balance if they are at risk of falling.  Older adults should determine their level of effort for physical activity relative to their level of fitness.  Older adults with chronic conditions should understand whether and how their conditions affect their ability to do  regular physical activity safely.  Behavioral modification strategies: increasing lean protein intake, decreasing simple carbohydrates, increasing vegetables, increasing water intake, no skipping meals, meal planning and cooking strategies, keeping healthy foods in the home, ways to avoid boredom eating, ways to avoid night time snacking, better snacking choices, emotional eating strategies, holiday eating strategies , celebration eating strategies, planning for success, and keeping a strict food journal.  [name] has agreed to follow-up with our clinic in 12 weeks. She was informed of the importance of frequent follow-up visits to maximize her success with intensive lifestyle modifications for her multiple health conditions.   Objective:   Blood pressure 128/69, pulse 95, temperature 98.3 F (36.8 C), height 5\' 2"  (1.575 m), weight 190 lb (86.2 kg), SpO2 96%. Body mass index is 34.75 kg/m.  General: Cooperative, alert, well developed, in no acute distress. HEENT: Conjunctivae and lids unremarkable. Cardiovascular: Regular rhythm.  Lungs: Normal work of breathing. Neurologic: No focal deficits.   Lab Results  Component Value Date   CREATININE 0.93 11/19/2022   BUN 17 11/19/2022   NA 139 11/19/2022   K 4.1 11/19/2022   CL 102 11/19/2022   CO2 28 11/19/2022   Lab Results  Component Value Date   ALT 13 11/19/2022   AST 16 11/19/2022   ALKPHOS 62 11/19/2022   BILITOT 0.6 11/19/2022   Lab Results  Component Value Date   HGBA1C 5.9 11/19/2022   HGBA1C 5.9 (H) 08/18/2022   HGBA1C 5.8 (H) 03/24/2022   HGBA1C 6.2 11/13/2021   HGBA1C 5.8 (H) 07/01/2021   Lab Results  Component Value Date   INSULIN 19.7 11/10/2022   INSULIN 18.9 08/18/2022   INSULIN 20.5 03/24/2022   INSULIN 20.4 07/01/2021   INSULIN 26.6 (H) 01/28/2021   Lab Results  Component Value Date   TSH 2.590 01/30/2020   Lab Results  Component Value Date   CHOL 182 11/19/2022   HDL 62.70 11/19/2022   LDLCALC 76  11/19/2022   LDLDIRECT 96.0 11/13/2021   TRIG 216.0 (H) 11/19/2022   CHOLHDL 3 11/19/2022   Lab Results  Component Value Date   VD25OH 51.5 11/10/2022   VD25OH 53.4 08/18/2022   VD25OH 63.2 03/24/2022   Lab Results  Component Value Date   WBC 6.0 10/22/2020   HGB 14.9 10/22/2020   HCT 45.0 10/22/2020   MCV 89.6 10/22/2020   PLT 273.0 10/22/2020   No results found for: "IRON", "TIBC", "FERRITIN"  Attestation Statements:   Reviewed by clinician on day of visit: allergies, medications, problem list, medical history, surgical history, family history, social history, and previous encounter notes.  I have reviewed the above documentation for accuracy and completeness, and I agree with the above. -  Naina Sleeper d. Chey Cho, NP-C

## 2023-03-01 DIAGNOSIS — M25511 Pain in right shoulder: Secondary | ICD-10-CM | POA: Diagnosis not present

## 2023-03-17 DIAGNOSIS — M19011 Primary osteoarthritis, right shoulder: Secondary | ICD-10-CM | POA: Diagnosis not present

## 2023-03-29 ENCOUNTER — Ambulatory Visit (INDEPENDENT_AMBULATORY_CARE_PROVIDER_SITE_OTHER)
Admission: RE | Admit: 2023-03-29 | Discharge: 2023-03-29 | Disposition: A | Discharge status: Home / Self Care | Source: Ambulatory Visit | Attending: Family Medicine | Admitting: Family Medicine

## 2023-03-29 ENCOUNTER — Encounter: Payer: Self-pay | Admitting: Family Medicine

## 2023-03-29 ENCOUNTER — Ambulatory Visit: Payer: Medicare Other | Admitting: Family Medicine

## 2023-03-29 VITALS — BP 118/66 | HR 80 | Temp 98.2°F | Ht 62.0 in | Wt 195.4 lb

## 2023-03-29 DIAGNOSIS — R7303 Prediabetes: Secondary | ICD-10-CM | POA: Diagnosis not present

## 2023-03-29 DIAGNOSIS — E7800 Pure hypercholesterolemia, unspecified: Secondary | ICD-10-CM | POA: Diagnosis not present

## 2023-03-29 DIAGNOSIS — Z01818 Encounter for other preprocedural examination: Secondary | ICD-10-CM

## 2023-03-29 DIAGNOSIS — I700 Atherosclerosis of aorta: Secondary | ICD-10-CM | POA: Diagnosis not present

## 2023-03-29 DIAGNOSIS — I10 Essential (primary) hypertension: Secondary | ICD-10-CM

## 2023-03-29 LAB — CBC
HCT: 45.9 % (ref 36.0–46.0)
Hemoglobin: 15.3 g/dL — ABNORMAL HIGH (ref 12.0–15.0)
MCHC: 33.3 g/dL (ref 30.0–36.0)
MCV: 91.1 fl (ref 78.0–100.0)
Platelets: 294 10*3/uL (ref 150.0–400.0)
RBC: 5.04 Mil/uL (ref 3.87–5.11)
RDW: 13.3 % (ref 11.5–15.5)
WBC: 6.3 10*3/uL (ref 4.0–10.5)

## 2023-03-29 LAB — BASIC METABOLIC PANEL
BUN: 19 mg/dL (ref 6–23)
CO2: 28 meq/L (ref 19–32)
Calcium: 10.2 mg/dL (ref 8.4–10.5)
Chloride: 102 meq/L (ref 96–112)
Creatinine, Ser: 0.81 mg/dL (ref 0.40–1.20)
GFR: 70.03 mL/min (ref >60.00)
Glucose, Bld: 88 mg/dL (ref 70–99)
Potassium: 4.1 meq/L (ref 3.5–5.1)
Sodium: 139 meq/L (ref 135–145)

## 2023-03-29 LAB — HEMOGLOBIN A1C: Hgb A1c MFr Bld: 6 % (ref 4.6–6.5)

## 2023-03-29 NOTE — Patient Instructions (Signed)
 Good seeing you today.  Please have x-ray performed at the [facility] location below.  If any concerns on labs or x-ray I will let you know, but if those look okay I will complete the paperwork for your surgeon.  Good luck and take care!

## 2023-03-29 NOTE — Progress Notes (Signed)
 Subjective:  Patient ID: Lori Mooney, female    DOB: Mar 03, 1945  Age: 78 y.o. MRN: [id number]  CC:  Chief Complaint  Patient presents with   Medical Clearance    Pt is having reverse shoulder Replacement Rt shoulder, waiting for clearance before scheduling     HPI Blayne Garlick Reinhardt presents for   Preoperative evaluation for reverse right shoulder replacement for R shoulder pain.  Dr. Ranell Patrick. Meloxicam at times for pain. MRI in March, planned surgery in April.  History of GERD, hyperlipidemia, hypertension, prediabetes, hypertensive heart disease without heart failure by problem list..  She is on Lipitor, Zestoretic, metformin, vitamin D supplementation, famotidine for GERD.  Denies history of difficulty with anesthesia.  Last time she underwent general anesthesia was 2020 for left knee replacement, no difficulty.   No hx of OSA.  No ASA, blood thinners.   Denies chest pain or dyspnea with activity, specifically no chest pain or dyspnea with walking up 2 flights of stairs or 2 city blocks. No edema.   No history of CVA, TIA, or CHF. No hx of known CAD/MI/PTCA. she is not on insulin.  RCRI score of 0.   Last CXR in 2019.   Lab Results  Component Value Date   CREATININE 0.93 11/19/2022  eGFR 59.48  History Patient Active Problem List   Diagnosis Date Noted   Osteopenia 05/09/2021   Status post colonoscopy 09/26/2020   Insulin resistance 02/28/2020   Other hyperlipidemia 02/14/2020   Essential hypertension 01/30/2020   Hypertriglyceridemia 01/30/2020   Prediabetes 01/30/2020   Osteoarthritis 01/30/2020   Vitamin D deficiency 01/30/2020   H/O total knee replacement, left 08/24/2018   Osteoarthritis of spine with radiculopathy, lumbar region 06/25/2017   Back pain with sciatica 06/25/2017   Spinal stenosis of lumbar region without neurogenic claudication 06/25/2017   GERD (gastroesophageal reflux disease) 09/19/2013   Vertigo 03/02/2012   Hypercholesterolemia  10/08/2010   Benign hypertensive heart disease without heart failure 10/08/2010   Class 2 severe obesity with serious comorbidity and body mass index (BMI) of 37.0 to 37.9 in adult [facility]) 10/08/2010   Past Medical History:  Diagnosis Date   Allergy    Arthritis    history of arthritis in the fingers   Cataract    Constipation    DDD (degenerative disc disease), lumbar    Exogenous obesity    GERD (gastroesophageal reflux disease)    Heartburn    Hypercholesterolemia    Hyperlipidemia    hypercholesterolemia   Hypertension    Hypertriglyceridemia    Osteoarthritis    Palpitations    Prediabetes    SOB (shortness of breath) on exertion    Swelling of both lower extremities    Past Surgical History:  Procedure Laterality Date   CATARACT EXTRACTION W/ INTRAOCULAR LENS IMPLANT Left    DILITATION & CURRETTAGE/HYSTROSCOPY WITH VERSAPOINT RESECTION N/A 08/25/2012   Procedure: DILATATION & CURETTAGE/HYSTEROSCOPY WITH VERSAPOINT RESECTION;  Surgeon: Genia Del, MD;  Location: [facility];  Service: Gynecology;  Laterality: N/A;   EYE SURGERY     LUMBAR EPIDURAL INJECTION  08/04/2017   TONSILLECTOMY     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   Procedure: TOTAL KNEE ARTHROPLASTY;  Surgeon: Ranee Gosselin, MD;  Location: [facility];  Service: Orthopedics;  Laterality: Left;    TUBAL LIGATION     Allergies  Allergen Reactions   Shellfish Allergy Hives   Strawberry Extract Hives   Prior to Admission medications   Medication Sig Start Date  End Date Taking? Authorizing Provider  acetaminophen (TYLENOL) 325 MG tablet Take 650 mg by mouth every 6 (six) hours as needed.   Yes [provider]  atorvastatin (LIPITOR) 20 MG tablet Take 1 tablet (20 mg total) by mouth daily. 05/15/22  Yes Shade Flood, MD  Biotin w/ Vitamins C & E (HAIR/SKIN/NAILS PO) Take by mouth. Natures Bounty   Yes [provider]  Cholecalciferol (VITAMIN D-3) 1000 UNITS CAPS Take 1,000 Units  by mouth daily.   Yes [provider]  famotidine (PEPCID) 20 MG tablet 10 mg daily. 09/15/18  Yes [provider]  lisinopril-hydrochlorothiazide (ZESTORETIC) 10-12.5 MG tablet Take 1 tablet by mouth daily. 05/15/22  Yes Shade Flood, MD  loratadine (CLARITIN) 10 MG tablet Take 10 mg by mouth daily as needed for allergies.    Yes [provider]  magnesium 30 MG tablet Take 250 mg by mouth daily.    Yes [provider]  meloxicam (MOBIC) 7.5 MG tablet Take 1 tablet (7.5 mg total) by mouth daily. 09/17/22  Yes Danford, Orpha Bur D, NP  metFORMIN (GLUCOPHAGE) 500 MG tablet 1 tab with breakfast 01/12/23  Yes Danford, Jinny Blossom, NP   Social History   Socioeconomic History   Marital status: Married    Spouse name: Laloni Rowton   Number of children: 2   Years of education: Not on file   Highest education level: Bachelor's degree (e.g., BA, AB, BS)  Occupational History   Occupation: Retired Charity fundraiser  Tobacco Use   Smoking status: Never   Smokeless tobacco: Never  Vaping Use   Vaping status: Never Used  Substance and Sexual Activity   Alcohol use: Yes    Alcohol/week: 1.0 - 2.0 standard drink of alcohol    Types: 1 - 2 Standard drinks or equivalent per week   Drug use: No   Sexual activity: Yes  Other Topics Concern   Not on file  Social History Narrative   Married. Education: [employer].   Social Drivers of Corporate investment banker Strain: Low Risk  (03/26/2023)   Overall Financial Resource Strain (CARDIA)    Difficulty of Paying Living Expenses: Not hard at all  Food Insecurity: No Food Insecurity (03/26/2023)   Hunger Vital Sign    Worried About Running Out of Food in the Last Year: Never true    Ran Out of Food in the Last Year: Never true  Transportation Needs: No Transportation Needs (03/26/2023)   PRAPARE - Administrator, Civil Service (Medical): No    Lack of Transportation (Non-Medical): No  Physical Activity: Insufficiently Active  (03/26/2023)   Exercise Vital Sign    Days of Exercise per Week: 2 days    Minutes of Exercise per Session: 40 min  Stress: No Stress Concern Present (03/26/2023)   [employer] of Occupational Health - Occupational Stress Questionnaire    Feeling of Stress : Not at all  Social Connections: Socially Integrated (03/26/2023)   Social Connection and Isolation Panel [NHANES]    Frequency of Communication with Friends and Family: More than three times a week    Frequency of Social Gatherings with Friends and Family: Twice a week    Attends Religious Services: More than 4 times per year    Active Member of [employer] or Organizations: Yes    Attends Banker Meetings: 1 to 4 times per year    Marital Status: Married  Catering manager Violence: Not At Risk (03/17/2022)   Humiliation,  Afraid, Rape, and Kick questionnaire    Fear of Current or Ex-Partner: No    Emotionally Abused: No    Physically Abused: No    Sexually Abused: No    Review of Systems   Objective:   Vitals:   03/29/23 0856  BP: 118/66  Pulse: 80  Temp: 98.2 F (36.8 C)  TempSrc: Temporal  SpO2: 96%  Weight: 195 lb 6.4 oz (88.6 kg)  Height: 5\' 2"  (1.575 m)     Physical Exam Vitals reviewed.  Constitutional:      Appearance: Normal appearance. She is well-developed.  HENT:     Head: Normocephalic and atraumatic.  Eyes:     Conjunctiva/sclera: Conjunctivae normal.     Pupils: Pupils are equal, round, and reactive to light.  Neck:     Vascular: No carotid bruit.  Cardiovascular:     Rate and Rhythm: Normal rate and regular rhythm.     Heart sounds: Normal heart sounds.     Comments: No 3rd heart sound.  Pulmonary:     Effort: Pulmonary effort is normal.     Breath sounds: Normal breath sounds.  Abdominal:     Palpations: Abdomen is soft. There is no pulsatile mass.     Tenderness: There is no abdominal tenderness.  Musculoskeletal:     Right lower leg: No edema.     Left lower leg: No edema.   Skin:    General: Skin is warm and dry.  Neurological:     Mental Status: She is alert and oriented to person, place, and time.  Psychiatric:        Mood and Affect: Mood normal.        Behavior: Behavior normal.       EKG, sinus rhythm with rate 86, minimal voltage criteria for LVH.  Compared to EKG 01/30/2020, no significant changes.  No acute ST or T wave changes. Assessment & Plan:  Wanona Stare is a 78 y.o. female . Preoperative evaluation to rule out surgical contraindication - Plan: EKG 12-Lead, Basic metabolic panel, CBC, Hemoglobin A1c, DG Chest 2 View  Hypercholesterolemia  Essential hypertension - Plan: EKG 12-Lead, Basic metabolic panel, CBC, DG Chest 2 View  Prediabetes - Plan: Hemoglobin A1c  Preoperative eval for right shoulder surgery as above.  Low RCRI score of 0, no acute findings on EKG, does not appear to be at any increased risk of major adverse cardiac event and acceptable risk for planned procedure.  History of hypertensive heart disease, with possible borderline LVH on EKG.  Patient with CHF.  Exam reassuring, check chest x-ray, labs as above and then if those are reassuring will complete paperwork for surgeon.  All questions were answered.  No orders of the defined types were placed in this encounter.  Patient Instructions  Good seeing you today.  Please have x-ray performed at the [facility] location below.  If any concerns on labs or x-ray I will let you know, but if those look okay I will complete the paperwork for your surgeon.  Good luck and take care!    Signed,   Meredith Staggers, MD [facility], [facility] 03/29/23 9:50 AM

## 2023-04-01 ENCOUNTER — Encounter: Payer: Self-pay | Admitting: Family Medicine

## 2023-04-01 ENCOUNTER — Telehealth: Payer: Self-pay | Admitting: Family Medicine

## 2023-04-01 NOTE — Telephone Encounter (Signed)
 Preop eval paperwork completed, placed in fax bin.  Please fax to requesting provider.  Thank you.

## 2023-04-02 NOTE — Telephone Encounter (Signed)
 Picked up form, collected EKG and printed lab results, faxed to the requested fax number on the form

## 2023-04-06 ENCOUNTER — Ambulatory Visit (INDEPENDENT_AMBULATORY_CARE_PROVIDER_SITE_OTHER): Payer: Medicare Other | Admitting: Adult Health

## 2023-04-06 ENCOUNTER — Encounter (INDEPENDENT_AMBULATORY_CARE_PROVIDER_SITE_OTHER): Payer: Self-pay | Admitting: Adult Health

## 2023-04-06 VITALS — BP 112/73 | HR 90 | Temp 97.7°F | Ht 62.0 in | Wt 192.0 lb

## 2023-04-06 DIAGNOSIS — I10 Essential (primary) hypertension: Secondary | ICD-10-CM | POA: Diagnosis not present

## 2023-04-06 DIAGNOSIS — E7800 Pure hypercholesterolemia, unspecified: Secondary | ICD-10-CM | POA: Diagnosis not present

## 2023-04-06 DIAGNOSIS — M25511 Pain in right shoulder: Secondary | ICD-10-CM

## 2023-04-06 DIAGNOSIS — Z6835 Body mass index (BMI) 35.0-35.9, adult: Secondary | ICD-10-CM

## 2023-04-06 DIAGNOSIS — E669 Obesity, unspecified: Secondary | ICD-10-CM | POA: Diagnosis not present

## 2023-04-06 DIAGNOSIS — G8929 Other chronic pain: Secondary | ICD-10-CM | POA: Diagnosis not present

## 2023-04-06 DIAGNOSIS — E559 Vitamin D deficiency, unspecified: Secondary | ICD-10-CM | POA: Diagnosis not present

## 2023-04-06 DIAGNOSIS — E66812 Obesity, class 2: Secondary | ICD-10-CM

## 2023-04-06 DIAGNOSIS — R7303 Prediabetes: Secondary | ICD-10-CM

## 2023-04-06 MED ORDER — METFORMIN HCL 500 MG PO TABS: ORAL_TABLET | ORAL | 0 refills | Status: DC

## 2023-04-06 NOTE — Progress Notes (Signed)
 WEIGHT SUMMARY AND BIOMETRICS  Vitals Temp: 97.7 F (36.5 C) BP: 112/73 Pulse Rate: 90 SpO2: 96 %   Anthropometric Measurements Height: 5\' 2"  (1.575 m) Weight: 192 lb (87.1 kg) BMI (Calculated): 35.11 Weight at Last Visit: 190lb Weight Lost Since Last Visit: 0 Weight Gained Since Last Visit: 2lb Starting Weight: 203lb Total Weight Loss (lbs): 11 lb (4.99 kg)   Body Composition  Body Fat %: 48 % Fat Mass (lbs): 92.4 lbs Muscle Mass (lbs): 95.2 lbs Total Body Water (lbs): 72.8 lbs Visceral Fat Rating : 16   Other Clinical Data Fasting: no Labs: no Today's Visit #: 37 Starting Date: 01/30/20    Chief Complaint:   OBESITY Lori Mooney is here to discuss her progress with her obesity treatment plan.  She is on the keeping a food journal and adhering to recommended goals of 1000 calories and 85-90g protein and states she is following her eating plan approximately 80 % of the time.  She states she is exercising: NEAT Activities   Interim History:  She is currently followed by [facility] She was recently seen by [facility]/Dr. Ranell Patrick- R Shoulder pain, rec: reverse total shoulder She endorses significant limitation in ROM and performing ADLs  Hunger/appetite-she endorses stable appetite  Stress- she is worried about her recovery process s/p R Reverse Total Shoulder   Exercise-NEAT Activities  Reviewed Bioimpedance results with pt: Muscle Mass: +0.4 lb Adipose Mass: + 2 lbs  Subjective:   1. Hypercholesterolemia Lipid Panel     Component Value Date/Time   CHOL 182 11/19/2022 0833   CHOL 200 (H) 07/15/2020 0842   TRIG 216.0 (H) 11/19/2022 0833   HDL 62.70 11/19/2022 0833   HDL 59 07/15/2020 0842   CHOLHDL 3 11/19/2022 0833   VLDL 43.2 (H) 11/19/2022 0833   LDLCALC 76 11/19/2022 0833   LDLCALC 104 (H) 07/15/2020 0842   LDLDIRECT 96.0 11/13/2021 0843   LABVLDL 37 07/15/2020 0842    PCP manages daily Lipitor 20mg    2. Essential  hypertension 03/29/2023 BMP- stable BP at OV excellent and at goal She is on lisinopril-hydrochlorothiazide (ZESTORETIC) 10-12.5 MG tablet   3. Vitamin D deficiency  Latest Reference Range & Units 03/24/22 08:02 08/18/22 12:37 11/10/22 10:21  Vitamin D, 25-Hydroxy 30.0 - 100.0 ng/mL 63.2 53.4 51.5   Vit D level is stable She is on daily OTC Vit D3 1,000 international units    4. Prediabetes Lab Results  Component Value Date   HGBA1C 6.0 03/29/2023   HGBA1C 5.9 11/19/2022   HGBA1C 5.9 (H) 08/18/2022     Latest Reference Range & Units 03/29/23 09:56  GFR >60.00 mL/min 70.03   She is on daily Metformin 500mg  She denies GI upset She denies sx's of hypogylcemia  5. Chronic right shoulder pain [facility] OV NOTES: 03/17/2023 03/17/2023 Imaging: X-rays of the right shoulder show bone-on-bone arthritis with marginal osteophyte formation and complete loss of joint space.   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 I had a frank discussion with the patient regarding treatment options including injections which would temporarily relieve her pain and help her quality of life but would not address her core pathology. We also discussed joint replacement surgery. We discussed both anatomic versus reverse total shoulder arthroplasty. At age 78, would recommend a reverse total shoulder to take the rotator cuff really out of the picture in terms of the potential for future rotator cuff dysfunction which could compromise the clinical result from her shoulder arthroplasty.  Patient agrees with this plan. She also likes the idea of less physical therapy and a quicker recovery less sling time. Risks including but not limited to infection and instability discussed with the patient. I do recommend an MRI scan to assess for rotator cuff integrity, especially the subscapularis for presurgical planning. Patient agrees. MRI order placed. I will call her with results of that. Flowsheet completed.      Assessment/Plan:   1. Hypercholesterolemia (Primary) Limit sat fat Continue daily statin therapy per PCP  2. Essential hypertension Continue  lisinopril-hydrochlorothiazide (ZESTORETIC) 10-12.5 MG tablet   3. Vitamin D deficiency Monitor Labs Continue OTC Supplementation  4. Prediabetes Refill - metFORMIN (GLUCOPHAGE) 500 MG tablet; 1 tab with breakfast  Dispense: 90 tablet; Refill: 0  HOLD Metformin MORNING OF SURGERY- restart per surgeon/anesthesia   5. Chronic right shoulder pain Increase protein intake Complete MRI as ordered F/u with Orthopedic Surgeon at directed  6. Obesity, current BMI 35.11  Laynee is currently in the action stage of change. As such, her goal is to maintain weight for now. She has agreed to keeping a food journal and adhering to recommended goals of 1000 calories and 90g+ protein.   Exercise goals: Older adults should follow the adult guidelines. When older adults cannot meet the adult guidelines, they should be as physically active as their abilities and conditions will allow.  Older adults should do exercises that maintain or improve balance if they are at risk of falling.  Older adults should determine their level of effort for physical activity relative to their level of fitness.  Older adults with chronic conditions should understand whether and how their conditions affect their ability to do regular physical activity safely.  Behavioral modification strategies: increasing lean protein intake, decreasing simple carbohydrates, increasing vegetables, increasing water intake, no skipping meals, meal planning and cooking strategies, keeping healthy foods in the home, better snacking choices, and planning for success.  Pasha has agreed to follow-up with our clinic in 12 weeks. MYCHART VIDEO VISIT (s/p R shoulder replacement)  She was informed of the importance of frequent follow-up visits to maximize her success with intensive lifestyle  modifications for her multiple health conditions.   Objective:   Blood pressure 112/73, pulse 90, temperature 97.7 F (36.5 C), height 5\' 2"  (1.575 m), weight 192 lb (87.1 kg), SpO2 96%. Body mass index is 35.12 kg/m.  General: Cooperative, alert, well developed, in no acute distress. HEENT: Conjunctivae and lids unremarkable. Cardiovascular: Regular rhythm.  Lungs: Normal work of breathing. Neurologic: No focal deficits.   Lab Results  Component Value Date   CREATININE 0.81 03/29/2023   BUN 19 03/29/2023   NA 139 03/29/2023   K 4.1 03/29/2023   CL 102 03/29/2023   CO2 28 03/29/2023   Lab Results  Component Value Date   ALT 13 11/19/2022   AST 16 11/19/2022   ALKPHOS 62 11/19/2022   BILITOT 0.6 11/19/2022   Lab Results  Component Value Date   HGBA1C 6.0 03/29/2023   HGBA1C 5.9 11/19/2022   HGBA1C 5.9 (H) 08/18/2022   HGBA1C 5.8 (H) 03/24/2022   HGBA1C 6.2 11/13/2021   Lab Results  Component Value Date   INSULIN 19.7 11/10/2022   INSULIN 18.9 08/18/2022   INSULIN 20.5 03/24/2022   INSULIN 20.4 07/01/2021   INSULIN 26.6 (H) 01/28/2021   Lab Results  Component Value Date   TSH 2.590 01/30/2020   Lab Results  Component Value Date   CHOL 182 11/19/2022   HDL 62.70  11/19/2022   LDLCALC 76 11/19/2022   LDLDIRECT 96.0 11/13/2021   TRIG 216.0 (H) 11/19/2022   CHOLHDL 3 11/19/2022   Lab Results  Component Value Date   VD25OH 51.5 11/10/2022   VD25OH 53.4 08/18/2022   VD25OH 63.2 03/24/2022   Lab Results  Component Value Date   WBC 6.3 03/29/2023   HGB 15.3 (H) 03/29/2023   HCT 45.9 03/29/2023   MCV 91.1 03/29/2023   PLT 294.0 03/29/2023   No results found for: "IRON", "TIBC", "FERRITIN"  Attestation Statements:   Reviewed by clinician on day of visit: allergies, medications, problem list, medical history, surgical history, family history, social history, and previous encounter notes.  Time spent on visit including pre-visit chart review and  post-visit care and charting was 28 minutes.   I have reviewed the above documentation for accuracy and completeness, and I agree with the above. -  Ivie Maese d. [place], NP-C

## 2023-04-06 NOTE — Telephone Encounter (Signed)
 Received a CRM that [facility] did not receive the Surgical Clearance form fax. I have faxed it again 04/06/23 @ 8:10am

## 2023-04-10 DIAGNOSIS — M25511 Pain in right shoulder: Secondary | ICD-10-CM | POA: Diagnosis not present

## 2023-04-12 DIAGNOSIS — H2511 Age-related nuclear cataract, right eye: Secondary | ICD-10-CM | POA: Diagnosis not present

## 2023-04-12 DIAGNOSIS — H25011 Cortical age-related cataract, right eye: Secondary | ICD-10-CM | POA: Diagnosis not present

## 2023-04-12 DIAGNOSIS — Z961 Presence of intraocular lens: Secondary | ICD-10-CM | POA: Diagnosis not present

## 2023-04-12 DIAGNOSIS — H04123 Dry eye syndrome of bilateral lacrimal glands: Secondary | ICD-10-CM | POA: Diagnosis not present

## 2023-04-12 DIAGNOSIS — H52203 Unspecified astigmatism, bilateral: Secondary | ICD-10-CM | POA: Diagnosis not present

## 2023-04-12 DIAGNOSIS — H40053 Ocular hypertension, bilateral: Secondary | ICD-10-CM | POA: Diagnosis not present

## 2023-04-12 DIAGNOSIS — H35372 Puckering of macula, left eye: Secondary | ICD-10-CM | POA: Diagnosis not present

## 2023-04-13 ENCOUNTER — Ambulatory Visit (INDEPENDENT_AMBULATORY_CARE_PROVIDER_SITE_OTHER): Payer: Medicare Other | Admitting: *Deleted

## 2023-04-13 DIAGNOSIS — Z0000 Encounter for general adult medical examination without abnormal findings: Secondary | ICD-10-CM | POA: Diagnosis not present

## 2023-04-13 NOTE — Progress Notes (Signed)
 Subjective:   Lori Mooney is a 78 y.o. female who presents for Medicare Annual (Subsequent) preventive examination.  Visit Complete: Virtual I connected with  Charlese Gruetzmacher Nulty on 04/13/23 by a audio enabled telemedicine application and verified that I am speaking with the correct person using two identifiers.  Patient Location: Home  Provider Location: Home Office  I discussed the limitations of evaluation and management by telemedicine. The patient expressed understanding and agreed to proceed.  Vital Signs: Because this visit was a virtual/telehealth visit, some criteria may be missing or patient reported. Any vitals not documented were not able to be obtained and vitals that have been documented are patient reported.  Patient Medicare AWV questionnaire was completed by the patient on 04-10-2023; I have confirmed that all information answered by patient is correct and no changes since this date.  Cardiac Risk Factors include: advanced age (>7men, >60 women);hypertension     Objective:    There were no vitals filed for this visit. There is no height or weight on file to calculate BMI.     04/13/2023    8:30 AM 03/17/2022    8:50 AM 02/06/2021   11:25 AM 01/11/2020   10:04 AM 08/24/2018    5:44 AM 08/23/2018   10:19 AM 10/19/2017    2:18 PM  Advanced Directives  Does Patient Have a Medical Advance Directive? Yes Yes Yes Yes Yes Yes Yes  Type of Estate agent of [place];Living will Healthcare Power of [place];Living will Healthcare Power of [place];Living will Living will Healthcare Power of [place];Living will Healthcare Power of [place];Living will   Does patient want to make changes to medical advance directive?     No - Patient declined    Copy of Healthcare Power of Attorney in Chart? No - copy requested No - copy requested No - copy requested        Current Medications (verified) Outpatient Encounter Medications as of 04/13/2023   Medication Sig   acetaminophen (TYLENOL) 325 MG tablet Take 650 mg by mouth every 6 (six) hours as needed.   atorvastatin (LIPITOR) 20 MG tablet Take 1 tablet (20 mg total) by mouth daily.   Biotin w/ Vitamins C & E (HAIR/SKIN/NAILS PO) Take by mouth. Natures Bounty   Cholecalciferol (VITAMIN D-3) 1000 UNITS CAPS Take 1,000 Units by mouth daily.   famotidine (PEPCID) 20 MG tablet 10 mg daily.   lisinopril-hydrochlorothiazide (ZESTORETIC) 10-12.5 MG tablet Take 1 tablet by mouth daily.   loratadine (CLARITIN) 10 MG tablet Take 10 mg by mouth daily as needed for allergies.    magnesium 30 MG tablet Take 250 mg by mouth daily.    meloxicam (MOBIC) 7.5 MG tablet Take 1 tablet (7.5 mg total) by mouth daily.   metFORMIN (GLUCOPHAGE) 500 MG tablet 1 tab with breakfast   No facility-administered encounter medications on file as of 04/13/2023.    Allergies (verified) Shellfish allergy and Strawberry extract   History: Past Medical History:  Diagnosis Date   Allergy    Arthritis    history of arthritis in the fingers   Cataract    Constipation    DDD (degenerative disc disease), lumbar    Exogenous obesity    GERD (gastroesophageal reflux disease)    Heartburn    Hypercholesterolemia    Hyperlipidemia    hypercholesterolemia   Hypertension    Hypertriglyceridemia    Osteoarthritis    Palpitations    Prediabetes    SOB (shortness of breath) on exertion  Swelling of both lower extremities    Past Surgical History:  Procedure Laterality Date   CATARACT EXTRACTION W/ INTRAOCULAR LENS IMPLANT Left    DILITATION & CURRETTAGE/HYSTROSCOPY WITH VERSAPOINT RESECTION N/A 08/25/2012   Procedure: DILATATION & CURETTAGE/HYSTEROSCOPY WITH VERSAPOINT RESECTION;  Surgeon: Genia Del, MD;  Location: [facility];  Service: Gynecology;  Laterality: N/A;   EYE SURGERY     LUMBAR EPIDURAL INJECTION  08/04/2017   TONSILLECTOMY     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   Procedure: TOTAL KNEE  ARTHROPLASTY;  Surgeon: Ranee Gosselin, MD;  Location: [facility];  Service: Orthopedics;  Laterality: Left;    TUBAL LIGATION     Family History  Problem Relation Age of Onset   Hyperlipidemia Mother    Heart disease Mother    Hypertension Mother    Hypertension Father    Hyperlipidemia Father    Thyroid disease Father    Hyperlipidemia Sister    Hypertension Sister    Social History   Socioeconomic History   Marital status: Married    Spouse name: Kelaiah Escalona   Number of children: 2   Years of education: Not on file   Highest education level: Bachelor's degree (e.g., BA, AB, BS)  Occupational History   Occupation: Retired Charity fundraiser  Tobacco Use   Smoking status: Never   Smokeless tobacco: Never  Vaping Use   Vaping status: Never Used  Substance and Sexual Activity   Alcohol use: Yes    Alcohol/week: 1.0 - 2.0 standard drink of alcohol    Types: 1 - 2 Standard drinks or equivalent per week   Drug use: No   Sexual activity: Yes  Other Topics Concern   Not on file  Social History Narrative   Married. Education: [employer].   Social Drivers of Corporate investment banker Strain: Low Risk  (04/13/2023)   Overall Financial Resource Strain (CARDIA)    Difficulty of Paying Living Expenses: Not hard at all  Food Insecurity: No Food Insecurity (04/13/2023)   Hunger Vital Sign    Worried About Running Out of Food in the Last Year: Never true    Ran Out of Food in the Last Year: Never true  Transportation Needs: No Transportation Needs (04/13/2023)   PRAPARE - Administrator, Civil Service (Medical): No    Lack of Transportation (Non-Medical): No  Physical Activity: Insufficiently Active (04/13/2023)   Exercise Vital Sign    Days of Exercise per Week: 2 days    Minutes of Exercise per Session: 40 min  Stress: No Stress Concern Present (04/13/2023)   [employer] of Occupational Health - Occupational Stress Questionnaire    Feeling of Stress : Not at all  Social  Connections: Socially Integrated (04/13/2023)   Social Connection and Isolation Panel [NHANES]    Frequency of Communication with Friends and Family: More than three times a week    Frequency of Social Gatherings with Friends and Family: Twice a week    Attends Religious Services: More than 4 times per year    Active Member of [employer] or Organizations: Yes    Attends Banker Meetings: 1 to 4 times per year    Marital Status: Married    Tobacco Counseling Counseling given: Not Answered   Clinical Intake:  Pre-visit preparation completed: Yes  Pain : No/denies pain     Diabetes: No  How often do you need to have someone help you when you read instructions, pamphlets, or other written materials  from your doctor or pharmacy?: 1 - Never  Interpreter Needed?: No  Information entered by :: Remi Haggard  LPN   Activities of Daily Living    04/13/2023    8:30 AM 04/10/2023    9:57 AM  In your present state of health, do you have any difficulty performing the following activities:  Hearing? 0 0  Vision? 0 0  Difficulty concentrating or making decisions? 0 0  Walking or climbing stairs? 1 1  Dressing or bathing? 0 0  Doing errands, shopping? 0 0  Preparing Food and eating ? N N  Using the Toilet? N N  In the past six months, have you accidently leaked urine? Y Y  Do you have problems with loss of bowel control? N N  Managing your Medications? N N  Managing your Finances? N N  Housekeeping or managing your Housekeeping? N N    Patient Care Team: Shade Flood, MD as PCP - General (Family Medicine) Jake Bathe, MD as Consulting Physician (Cardiology) Bernette Redbird, MD as Consulting Physician (Gastroenterology) Alanson Puls, MD as Attending Physician (Dermatology)  Indicate any recent Medical Services you may have received from other than [facility] providers in the past year (date may be approximate).     Assessment:   This is a routine wellness  examination for [place].  Hearing/Vision screen Hearing Screening - Comments:: No trouble hearing Vision Screening - Comments:: Up to date [place]   Goals Addressed             This Visit's Progress    Patient Stated       Be positive Live Life to fullest       Depression Screen    04/13/2023    8:38 AM 03/29/2023    9:00 AM 03/29/2023    8:55 AM 11/19/2022    8:08 AM 05/15/2022    8:19 AM 03/17/2022    8:44 AM 11/13/2021    8:00 AM  PHQ 2/9 Scores  PHQ - 2 Score 0 0 0 0 0 0 0  PHQ- 9 Score 1 0 0 0 0      Fall Risk    04/13/2023    8:26 AM 04/10/2023    9:57 AM 03/29/2023    8:53 AM 11/19/2022    8:08 AM 05/15/2022    8:18 AM  Fall Risk   Falls in the past year? 0 0 0 0 0  Number falls in past yr: 0 0 0 0   Injury with Fall? 0 0 0 0   Risk for fall due to :   No Fall Risks  No Fall Risks  Follow up Falls evaluation completed;Education provided;Falls prevention discussed  Falls evaluation completed Falls evaluation completed     MEDICARE RISK AT HOME: Medicare Risk at Home Any stairs in or around the home?: Yes If so, are there any without handrails?: No Home free of loose throw rugs in walkways, pet beds, electrical cords, etc?: Yes Adequate lighting in your home to reduce risk of falls?: Yes Life alert?: No Use of a cane, walker or w/c?: No Grab bars in the bathroom?: Yes Shower chair or bench in shower?: Yes Elevated toilet seat or a handicapped toilet?: Yes  TIMED UP AND GO:  Was the test performed?  No    Cognitive Function:        04/13/2023    8:28 AM 03/17/2022    8:50 AM 01/11/2020   10:04 AM 10/19/2017    3:04 PM  6CIT Screen  What Year? 0 points 0 points 0 points 0 points  What month? 0 points 0 points 0 points 0 points  What time? 0 points 0 points 0 points 0 points  Count back from 20 0 points 0 points 0 points 0 points  Months in reverse 0 points 0 points 2 points 0 points  Repeat phrase 0 points 0 points 0 points 0 points  Total Score 0  points 0 points 2 points 0 points    Immunizations Immunization History  Administered Date(s) Administered   Fluad Quad(high Dose 65+) 10/24/2020, 10/23/2021   Fluad Trivalent(High Dose 65+) 10/22/2022   Influenza Nasal 10/16/2017   Influenza Whole 11/07/2011   Influenza,inj,Quad PF,6+ Mos 10/28/2012   Influenza-Unspecified 10/26/2013, 10/15/2014, 11/10/2015, 10/19/2016, 11/05/2019   PFIZER(Purple Top)SARS-COV-2 Vaccination 03/04/2019, 03/29/2019, 12/09/2019, 11/26/2020   Pneumococcal Conjugate-13 10/30/2014   Pneumococcal Polysaccharide-23 07/20/2016   Td 03/20/2009   Td (Adult), 2 Lf Tetanus Toxid, Preservative Free 03/20/2009   Tdap 11/24/2019   Zoster Recombinant(Shingrix) 12/04/2021, 05/15/2022    TDAP status: Up to date  Flu Vaccine status: Up to date  Pneumococcal vaccine status: Up to date  Covid-19 vaccine status: Information provided on how to obtain vaccines.   Qualifies for Shingles Vaccine? No   Zostavax completed Yes   Shingrix Completed?: Yes  Screening Tests Health Maintenance  Topic Date Due   COVID-19 Vaccine (5 - 2024-25 season) 09/27/2022   Medicare Annual Wellness (AWV)  04/12/2024   DTaP/Tdap/Td (3 - Td or Tdap) 11/23/2029   Pneumonia Vaccine 61+ Years old  Completed   INFLUENZA VACCINE  Completed   DEXA SCAN  Completed   Hepatitis C Screening  Completed   HPV VACCINES  Aged Out   Colonoscopy  Discontinued   Zoster Vaccines- Shingrix  Discontinued    Health Maintenance  Health Maintenance Due  Topic Date Due   COVID-19 Vaccine (5 - 2024-25 season) 09/27/2022    Colorectal cancer screening: No longer required.   Mammogram status: Completed  . Repeat every year  Bone Density status: Completed 2023. Results reflect: Bone density results: OSTEOPENIA. Repeat every 3-5 years.  Lung Cancer Screening: (Low Dose CT Chest recommended if Age 52-80 years, 20 pack-year currently smoking OR have quit w/in 15years.) does not qualify.   Lung Cancer  Screening Referral:    Additional Screening:  Hepatitis C Screening: does not qualify; Completed 2017  Vision Screening: Recommended annual ophthalmology exams for early detection of glaucoma and other disorders of the eye. Is the patient up to date with their annual eye exam?  Yes  Who is the provider or what is the name of the office in which the patient attends annual eye exams? 2017 If pt is not established with a provider, would they like to be referred to a provider to establish care? No .   Dental Screening: Recommended annual dental exams for proper oral hygiene   Community Resource Referral / Chronic Care Management: CRR required this visit?  No   CCM required this visit?  No     Plan:     I have personally reviewed and noted the following in the patient's chart:   Medical and social history Use of alcohol, tobacco or illicit drugs  Current medications and supplements including opioid prescriptions. Patient is not currently taking opioid prescriptions. Functional ability and status Nutritional status Physical activity Advanced directives List of other physicians Hospitalizations, surgeries, and ER visits in previous 12 months Vitals Screenings to include cognitive, depression, and  falls Referrals and appointments  In addition, I have reviewed and discussed with patient certain preventive protocols, quality metrics, and best practice recommendations. A written personalized care plan for preventive services as well as general preventive health recommendations were provided to patient.     Remi Haggard, LPN   08/20/3662   After Visit Summary: (MyChart) Due to this being a telephonic visit, the after visit summary with patients personalized plan was offered to patient via MyChart   Nurse Notes:

## 2023-04-13 NOTE — Patient Instructions (Signed)
 Lori Mooney , Thank you for taking time to come for your Medicare Wellness Visit. I appreciate your ongoing commitment to your health goals. Please review the following plan we discussed and let me know if I can assist you in the future.   Screening recommendations/referrals: Colonoscopy: no longer required Mammogram: up to date Bone Density: up to date Recommended yearly ophthalmology/optometry visit for glaucoma screening and checkup Recommended yearly dental visit for hygiene and checkup  Vaccinations: Influenza vaccine: up to date Pneumococcal vaccine: up to date Tdap vaccine: up to date Shingles vaccine: up to date    Advanced directives: up to date     Preventive Care 65 Years and Older, Female Preventive care refers to lifestyle choices and visits with your health care provider that can promote health and wellness. What does preventive care include? A yearly physical exam. This is also called an annual well check. Dental exams once or twice a year. Routine eye exams. Ask your health care provider how often you should have your eyes checked. Personal lifestyle choices, including: Daily care of your teeth and gums. Regular physical activity. Eating a healthy diet. Avoiding tobacco and drug use. Limiting alcohol use. Practicing safe sex. Taking low-dose aspirin every day. Taking vitamin and mineral supplements as recommended by your health care provider. What happens during an annual well check? The services and screenings done by your health care provider during your annual well check will depend on your age, overall health, lifestyle risk factors, and family history of disease. Counseling  Your health care provider may ask you questions about your: Alcohol use. Tobacco use. Drug use. Emotional well-being. Home and relationship well-being. Sexual activity. Eating habits. History of falls. Memory and ability to understand (cognition). Work and work  Astronomer. Reproductive health. Screening  You may have the following tests or measurements: Height, weight, and BMI. Blood pressure. Lipid and cholesterol levels. These may be checked every 5 years, or more frequently if you are over 67 years old. Skin check. Lung cancer screening. You may have this screening every year starting at age 33 if you have a 30-pack-year history of smoking and currently smoke or have quit within the past 15 years. Fecal occult blood test (FOBT) of the stool. You may have this test every year starting at age 5. Flexible sigmoidoscopy or colonoscopy. You may have a sigmoidoscopy every 5 years or a colonoscopy every 10 years starting at age 2. Hepatitis C blood test. Hepatitis B blood test. Sexually transmitted disease (STD) testing. Diabetes screening. This is done by checking your blood sugar (glucose) after you have not eaten for a while (fasting). You may have this done every 1-3 years. Bone density scan. This is done to screen for osteoporosis. You may have this done starting at age 69. Mammogram. This may be done every 1-2 years. Talk to your health care provider about how often you should have regular mammograms. Talk with your health care provider about your test results, treatment options, and if necessary, the need for more tests. Vaccines  Your health care provider may recommend certain vaccines, such as: Influenza vaccine. This is recommended every year. Tetanus, diphtheria, and acellular pertussis (Tdap, Td) vaccine. You may need a Td booster every 10 years. Zoster vaccine. You may need this after age 71. Pneumococcal 13-valent conjugate (PCV13) vaccine. One dose is recommended after age 37. Pneumococcal polysaccharide (PPSV23) vaccine. One dose is recommended after age 75. Talk to your health care provider about which screenings and vaccines you need  and how often you need them. This information is not intended to replace advice given to you by  your health care provider. Make sure you discuss any questions you have with your health care provider. Document Released: 02/08/2015 Document Revised: 10/02/2015 Document Reviewed: 11/13/2014 Elsevier Interactive Patient Education  2017 [employer].  Fall Prevention in the Home Falls can cause injuries. They can happen to people of all ages. There are many things you can do to make your home safe and to help prevent falls. What can I do on the outside of my home? Regularly fix the edges of walkways and driveways and fix any cracks. Remove anything that might make you trip as you walk through a door, such as a raised step or threshold. Trim any bushes or trees on the path to your home. Use bright outdoor lighting. Clear any walking paths of anything that might make someone trip, such as rocks or tools. Regularly check to see if handrails are loose or broken. Make sure that both sides of any steps have handrails. Any raised decks and porches should have guardrails on the edges. Have any leaves, snow, or ice cleared regularly. Use sand or salt on walking paths during winter. Clean up any spills in your garage right away. This includes oil or grease spills. What can I do in the bathroom? Use night lights. Install grab bars by the toilet and in the tub and shower. Do not use towel bars as grab bars. Use non-skid mats or decals in the tub or shower. If you need to sit down in the shower, use a plastic, non-slip stool. Keep the floor dry. Clean up any water that spills on the floor as soon as it happens. Remove soap buildup in the tub or shower regularly. Attach bath mats securely with double-sided non-slip rug tape. Do not have throw rugs and other things on the floor that can make you trip. What can I do in the bedroom? Use night lights. Make sure that you have a light by your bed that is easy to reach. Do not use any sheets or blankets that are too big for your bed. They should not hang  down onto the floor. Have a firm chair that has side arms. You can use this for support while you get dressed. Do not have throw rugs and other things on the floor that can make you trip. What can I do in the kitchen? Clean up any spills right away. Avoid walking on wet floors. Keep items that you use a lot in easy-to-reach places. If you need to reach something above you, use a strong step stool that has a grab bar. Keep electrical cords out of the way. Do not use floor polish or wax that makes floors slippery. If you must use wax, use non-skid floor wax. Do not have throw rugs and other things on the floor that can make you trip. What can I do with my stairs? Do not leave any items on the stairs. Make sure that there are handrails on both sides of the stairs and use them. Fix handrails that are broken or loose. Make sure that handrails are as long as the stairways. Check any carpeting to make sure that it is firmly attached to the stairs. Fix any carpet that is loose or worn. Avoid having throw rugs at the top or bottom of the stairs. If you do have throw rugs, attach them to the floor with carpet tape. Make sure that you  have a light switch at the top of the stairs and the bottom of the stairs. If you do not have them, ask someone to add them for you. What else can I do to help prevent falls? Wear shoes that: Do not have high heels. Have rubber bottoms. Are comfortable and fit you well. Are closed at the toe. Do not wear sandals. If you use a stepladder: Make sure that it is fully opened. Do not climb a closed stepladder. Make sure that both sides of the stepladder are locked into place. Ask someone to hold it for you, if possible. Clearly mark and make sure that you can see: Any grab bars or handrails. First and last steps. Where the edge of each step is. Use tools that help you move around (mobility aids) if they are needed. These  include: Canes. Walkers. Scooters. Crutches. Turn on the lights when you go into a dark area. Replace any light bulbs as soon as they burn out. Set up your furniture so you have a clear path. Avoid moving your furniture around. If any of your floors are uneven, fix them. If there are any pets around you, be aware of where they are. Review your medicines with your doctor. Some medicines can make you feel dizzy. This can increase your chance of falling. Ask your doctor what other things that you can do to help prevent falls. This information is not intended to replace advice given to you by your health care provider. Make sure you discuss any questions you have with your health care provider. Document Released: 11/08/2008 Document Revised: 06/20/2015 Document Reviewed: 02/16/2014 Elsevier Interactive Patient Education  2017 [employer].

## 2023-04-22 ENCOUNTER — Ambulatory Visit
Admission: RE | Admit: 2023-04-22 | Discharge: 2023-04-22 | Disposition: A | Discharge status: Home / Self Care | Source: Ambulatory Visit | Attending: Family Medicine | Admitting: Family Medicine

## 2023-04-22 DIAGNOSIS — R921 Mammographic calcification found on diagnostic imaging of breast: Secondary | ICD-10-CM | POA: Diagnosis not present

## 2023-05-17 DIAGNOSIS — G8918 Other acute postprocedural pain: Secondary | ICD-10-CM | POA: Diagnosis not present

## 2023-05-17 DIAGNOSIS — M19011 Primary osteoarthritis, right shoulder: Secondary | ICD-10-CM | POA: Diagnosis not present

## 2023-05-19 ENCOUNTER — Other Ambulatory Visit: Payer: Self-pay | Admitting: Family Medicine

## 2023-05-19 DIAGNOSIS — E7800 Pure hypercholesterolemia, unspecified: Secondary | ICD-10-CM

## 2023-05-21 ENCOUNTER — Ambulatory Visit: Payer: Medicare Other | Admitting: Family Medicine

## 2023-05-25 ENCOUNTER — Other Ambulatory Visit: Payer: Self-pay | Admitting: Family Medicine

## 2023-05-25 DIAGNOSIS — I10 Essential (primary) hypertension: Secondary | ICD-10-CM

## 2023-06-01 DIAGNOSIS — Z4889 Encounter for other specified surgical aftercare: Secondary | ICD-10-CM | POA: Diagnosis not present

## 2023-06-15 DIAGNOSIS — D1801 Hemangioma of skin and subcutaneous tissue: Secondary | ICD-10-CM | POA: Diagnosis not present

## 2023-06-15 DIAGNOSIS — L821 Other seborrheic keratosis: Secondary | ICD-10-CM | POA: Diagnosis not present

## 2023-06-15 DIAGNOSIS — L570 Actinic keratosis: Secondary | ICD-10-CM | POA: Diagnosis not present

## 2023-06-15 DIAGNOSIS — L918 Other hypertrophic disorders of the skin: Secondary | ICD-10-CM | POA: Diagnosis not present

## 2023-06-15 DIAGNOSIS — Z85828 Personal history of other malignant neoplasm of skin: Secondary | ICD-10-CM | POA: Diagnosis not present

## 2023-06-15 DIAGNOSIS — L720 Epidermal cyst: Secondary | ICD-10-CM | POA: Diagnosis not present

## 2023-06-29 ENCOUNTER — Encounter (INDEPENDENT_AMBULATORY_CARE_PROVIDER_SITE_OTHER): Payer: Self-pay | Admitting: Adult Health

## 2023-06-29 ENCOUNTER — Telehealth (INDEPENDENT_AMBULATORY_CARE_PROVIDER_SITE_OTHER): Admitting: Adult Health

## 2023-06-29 VITALS — Ht 62.0 in

## 2023-06-29 DIAGNOSIS — Z96611 Presence of right artificial shoulder joint: Secondary | ICD-10-CM | POA: Diagnosis not present

## 2023-06-29 DIAGNOSIS — I10 Essential (primary) hypertension: Secondary | ICD-10-CM

## 2023-06-29 DIAGNOSIS — R7303 Prediabetes: Secondary | ICD-10-CM

## 2023-06-29 DIAGNOSIS — E669 Obesity, unspecified: Secondary | ICD-10-CM

## 2023-06-29 DIAGNOSIS — Z6837 Body mass index (BMI) 37.0-37.9, adult: Secondary | ICD-10-CM

## 2023-06-29 DIAGNOSIS — E559 Vitamin D deficiency, unspecified: Secondary | ICD-10-CM

## 2023-06-29 DIAGNOSIS — E66812 Obesity, class 2: Secondary | ICD-10-CM

## 2023-06-29 MED ORDER — METFORMIN HCL 500 MG PO TABS: ORAL_TABLET | ORAL | 0 refills | Status: DC

## 2023-06-29 NOTE — Progress Notes (Signed)
 WEIGHT SUMMARY AND BIOMETRICS  No data recorded Anthropometric Measurements Height: 5\' 2"  (1.575 m) Weight at Last Visit: 192 Starting Weight: 203 lb   No data recorded Other Clinical Data Comments: video visit today    Chief Complaint:  I connected with  Lori Mooney on 06/29/23 by a video and audio enabled telemedicine application and verified that I am speaking with the correct person using two identifiers.  Patient Location: Home  Provider Location: Office/Clinic  I discussed the limitations of evaluation and management by telemedicine. The patient expressed understanding and agreed to proceed.  OBESITY Lori Mooney is here to discuss her progress with her obesity treatment plan.  She is on the keeping a food journal and adhering to recommended goals of 1000-1100 calories and 80g protein and states she is following her eating plan approximately 80 % of the time.  She states she is exercising: Home PT per Orthopedic surgeon  Interim History:  Lori Mooney underwent a successful R reverse arthroplasty of right shoulder approximately 6 weeks ago. She only required 1 dose of Hydrocodone -Acetamin 5-325 Mg  She has managed pain with ice, rest, and OTC analgesics  She is able to complete all ADLs, however has not been cleared to drive per her Orthopedic Surgeon/Dr. Brunilda Capra  She would like to review pre op labs completed at [facility] - unable to locate results vie Care Everywhere Recommend she review with Dr. Brunilda Capra at next wee's f/u  Subjective:   1. Vitamin D  deficiency  Latest Reference Range & Units 03/24/22 08:02 08/18/22 12:37 11/10/22 10:21  Vitamin D , 25-Hydroxy 30.0 - 100.0 ng/mL 63.2 53.4 51.5   She is on daily OTC Vit D3 1,000 international units  2. Pre-diabetes Lab Results  Component Value Date   HGBA1C 6.0 03/29/2023   HGBA1C 5.9 11/19/2022   HGBA1C 5.9 (H) 08/18/2022     Latest Reference Range & Units 03/29/23 09:56  GFR >60.00 mL/min  70.03   She is currently on Metformin  500mg - denies GI upset  3. 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 She only required 1 dose of Hydrocodone -Acetamin 5-325 Mg  She has managed pain with ice, rest, and OTC analgesics  She is able to complete all ADLs, however has not been cleared to drive per her Orthopedic Surgeon/Dr. Brunilda Capra  06/01/2023 [facility] OV Notes 06/01/2023 [id number] Dr. Brunilda Capra reviewed/interpreted ap/outlet views of right shoulder showing reverse total shoulder in good position and placement recommend gentle activity as tolerated without heavy lifting f/u in 4 weeks for recheck pt in agreement sling for support as needed   Assessment/Plan:   1. Vitamin D  deficiency (Primary) Continue OTC supplementation  2. Pre-diabetes Refill - metFORMIN  (GLUCOPHAGE ) 500 MG tablet; 1 tab with breakfast  Dispense: 90 tablet; Refill: 0  3. Essential hypertension Continue to monitor BP at home Remain well hydrated and limit Na+ intake  4. Status post reverse arthroplasty of right shoulder F/u with Dr. Brunilda Capra as advised   5. Obesity, Starting BMI 37.13  Lori Mooney is currently in the action stage of change. As such, her goal is to continue with weight loss efforts. She has agreed to keeping a food journal and adhering to recommended goals of 1000-1100 calories and 80g protein.  Exercise goals: No exercise has been prescribed at this time.  Behavioral modification strategies: increasing lean protein intake, decreasing simple carbohydrates, increasing vegetables, increasing water intake, no skipping meals, meal planning and cooking strategies, keeping healthy foods in the home, ways  to avoid boredom eating, and planning for success.  Lori Mooney has agreed to follow-up with our clinic in 12 weeks. She was informed of the importance of frequent follow-up visits to maximize her success with intensive lifestyle modifications for her multiple health conditions.   Objective:   Height 5\' 2"  (1.575 m). Body mass index is 35.12 kg/m.  General: Cooperative, alert, well developed, in no acute distress. HEENT: Conjunctivae and lids unremarkable. Cardiovascular: Regular rhythm.  Lungs: Normal work of breathing. Neurologic: No focal deficits.   Lab Results  Component Value Date   CREATININE 0.81 03/29/2023   BUN 19 03/29/2023   NA 139 03/29/2023   K 4.1 03/29/2023   CL 102 03/29/2023   CO2 28 03/29/2023   Lab Results  Component Value Date   ALT 13 11/19/2022   AST 16 11/19/2022   ALKPHOS 62 11/19/2022   BILITOT 0.6 11/19/2022   Lab Results  Component Value Date   HGBA1C 6.0 03/29/2023   HGBA1C 5.9 11/19/2022   HGBA1C 5.9 (H) 08/18/2022   HGBA1C 5.8 (H) 03/24/2022   HGBA1C 6.2 11/13/2021   Lab Results  Component Value Date   INSULIN  19.7 11/10/2022   INSULIN  18.9 08/18/2022   INSULIN  20.5 03/24/2022   INSULIN  20.4 07/01/2021   INSULIN  26.6 (H) 01/28/2021   Lab Results  Component Value Date   TSH 2.590 01/30/2020   Lab Results  Component Value Date   CHOL 182 11/19/2022   HDL 62.70 11/19/2022   LDLCALC 76 11/19/2022   LDLDIRECT 96.0 11/13/2021   TRIG 216.0 (H) 11/19/2022   CHOLHDL 3 11/19/2022   Lab Results  Component Value Date   VD25OH 51.5 11/10/2022   VD25OH 53.4 08/18/2022   VD25OH 63.2 03/24/2022   Lab Results  Component Value Date   WBC 6.3 03/29/2023   HGB 15.3 (H) 03/29/2023   HCT 45.9 03/29/2023   MCV 91.1 03/29/2023   PLT 294.0 03/29/2023   No results found for: "IRON", "TIBC", "FERRITIN"  Attestation Statements:   Reviewed by clinician on day of visit: allergies, medications, problem list, medical history, surgical history,  family history, social history, and previous encounter notes.  I have reviewed the above documentation for accuracy and completeness, and I agree with the above. - Taris Galindo d. Malayiah Mcbrayer, NP-C

## 2023-07-06 DIAGNOSIS — Z471 Aftercare following joint replacement surgery: Secondary | ICD-10-CM | POA: Diagnosis not present

## 2023-07-06 DIAGNOSIS — Z96611 Presence of right artificial shoulder joint: Secondary | ICD-10-CM | POA: Diagnosis not present

## 2023-07-26 DIAGNOSIS — M25511 Pain in right shoulder: Secondary | ICD-10-CM | POA: Diagnosis not present

## 2023-08-03 DIAGNOSIS — M25511 Pain in right shoulder: Secondary | ICD-10-CM | POA: Diagnosis not present

## 2023-08-09 DIAGNOSIS — M25511 Pain in right shoulder: Secondary | ICD-10-CM | POA: Diagnosis not present

## 2023-08-16 DIAGNOSIS — M25511 Pain in right shoulder: Secondary | ICD-10-CM | POA: Diagnosis not present

## 2023-08-17 DIAGNOSIS — Z4789 Encounter for other orthopedic aftercare: Secondary | ICD-10-CM | POA: Diagnosis not present

## 2023-08-17 DIAGNOSIS — M25562 Pain in left knee: Secondary | ICD-10-CM | POA: Diagnosis not present

## 2023-08-19 ENCOUNTER — Other Ambulatory Visit: Payer: Self-pay | Admitting: Family Medicine

## 2023-08-19 DIAGNOSIS — E7800 Pure hypercholesterolemia, unspecified: Secondary | ICD-10-CM

## 2023-08-24 DIAGNOSIS — M25511 Pain in right shoulder: Secondary | ICD-10-CM | POA: Diagnosis not present

## 2023-09-06 ENCOUNTER — Other Ambulatory Visit: Payer: Self-pay | Admitting: Family Medicine

## 2023-09-06 DIAGNOSIS — R921 Mammographic calcification found on diagnostic imaging of breast: Secondary | ICD-10-CM

## 2023-09-07 ENCOUNTER — Encounter (INDEPENDENT_AMBULATORY_CARE_PROVIDER_SITE_OTHER): Payer: Self-pay | Admitting: Adult Health

## 2023-09-07 ENCOUNTER — Ambulatory Visit (INDEPENDENT_AMBULATORY_CARE_PROVIDER_SITE_OTHER): Admitting: Adult Health

## 2023-09-07 VITALS — BP 117/76 | HR 83 | Temp 98.0°F | Ht 62.0 in | Wt 194.0 lb

## 2023-09-07 DIAGNOSIS — Z6835 Body mass index (BMI) 35.0-35.9, adult: Secondary | ICD-10-CM | POA: Diagnosis not present

## 2023-09-07 DIAGNOSIS — E66812 Obesity, class 2: Secondary | ICD-10-CM

## 2023-09-07 DIAGNOSIS — I10 Essential (primary) hypertension: Secondary | ICD-10-CM | POA: Diagnosis not present

## 2023-09-07 DIAGNOSIS — E559 Vitamin D deficiency, unspecified: Secondary | ICD-10-CM | POA: Diagnosis not present

## 2023-09-07 DIAGNOSIS — E669 Obesity, unspecified: Secondary | ICD-10-CM

## 2023-09-07 DIAGNOSIS — E7800 Pure hypercholesterolemia, unspecified: Secondary | ICD-10-CM

## 2023-09-07 DIAGNOSIS — Z0000 Encounter for general adult medical examination without abnormal findings: Secondary | ICD-10-CM

## 2023-09-07 DIAGNOSIS — R7303 Prediabetes: Secondary | ICD-10-CM

## 2023-09-07 MED ORDER — METFORMIN HCL 500 MG PO TABS: ORAL_TABLET | ORAL | 0 refills | Status: DC

## 2023-09-07 NOTE — Progress Notes (Signed)
 WEIGHT SUMMARY AND BIOMETRICS  Vitals Temp: 98 F (36.7 C) BP: 117/76 Pulse Rate: 83 SpO2: 98 %   Anthropometric Measurements Height: 5' 2 (1.575 m) Weight: 194 lb (88 kg) BMI (Calculated): 35.47 Weight at Last Visit: 192lb Weight Lost Since Last Visit: 0lb Weight Gained Since Last Visit: 2lb Starting Weight: 203lb Total Weight Loss (lbs): 9 lb (4.082 kg)   Body Composition  Body Fat %: 48.8 % Fat Mass (lbs): 94.8 lbs Muscle Mass (lbs): 94.4 lbs Total Body Water (lbs): 75.6 lbs Visceral Fat Rating : 16   Other Clinical Data Fasting: yes Labs: no Today's Visit #: 34 Starting Date: 01/30/20    Chief Complaint:   OBESITY Lori Mooney is here to discuss her progress with her obesity treatment plan.  She is on the keeping a food journal and adhering to recommended goals of 1200 calories and 80-90g protein and states she is following her eating plan approximately 75 % of the time.  She states she is exercising Walking 30 minutes 3 times per week.  Interim History:  She is followed by [facility] Last OV at [facility] 06/29/2023 Last OV with Dr. Norris/Orthopedic Surgeon on 08/17/23 Last OV with out patient PT on 08/24/2023  She states I feel great She has full ROM and strength in RUE  Reviewed Bioimpedance results at length with pt Discussed Medicare still not covering injectable AOMs She would like to continue at current follow-up intervals at [facility]  Subjective:   1. Healthcare maintenance She is followed by [facility] She has chronic f/u with PCP early Sept 2025  2. Essential hypertension BP at goal at OV PCP manages lisinopril -hydrochlorothiazide  (ZESTORETIC ) 10-12.5 MG tablet  atorvastatin  (LIPITOR) 20 MG tablet   She has been increasing weekly walking  3. Pre-diabetes Lab Results  Component Value Date   HGBA1C 6.0 03/29/2023   HGBA1C 5.9 11/19/2022   HGBA1C 5.9 (H) 08/18/2022     Latest Reference Range & Units 03/29/23 09:56  GFR >60.00 mL/min 70.03    She takes daily Metformin  500mg  with her first meal of day She denies GI upset  4. Vitamin D  deficiency  Latest Reference Range & Units 03/24/22 08:02 08/18/22 12:37 11/10/22 10:21  Vitamin D , 25-Hydroxy 30.0 - 100.0 ng/mL 63.2 53.4 51.5   She in on daily OTC VitD3 1000 international units   5. Hypercholesterolemia Lipid Panel     Component Value Date/Time   CHOL 182 11/19/2022 0833   CHOL 200 (H) 07/15/2020 0842   TRIG 216.0 (H) 11/19/2022 0833   HDL 62.70 11/19/2022 0833   HDL 59 07/15/2020 0842   CHOLHDL 3 11/19/2022 0833   VLDL 43.2 (H) 11/19/2022 0833   LDLCALC 76 11/19/2022 0833   LDLCALC 104 (H) 07/15/2020 0842   LDLDIRECT 96.0 11/13/2021 0843   LABVLDL 37 07/15/2020 0842    PCP manages daily Lipitor 20mg  She denies myalgias  Assessment/Plan:   1. Healthcare maintenance (Primary) Check Labs Continue healthy eating and regular walking Continue f/u at [facility]  2. Essential hypertension Check Labs - Comprehensive metabolic panel with GFR Limit Na+ intake  3. Pre-diabetes Refill - metFORMIN  (GLUCOPHAGE ) 500 MG tablet; 1 tab with breakfast  Dispense: 90 tablet; Refill: 0 Check Labs - Hemoglobin A1c - Insulin , random - Magnesium - Vitamin B12  4. Vitamin D  deficiency Check Labs - VITAMIN D  25 Hydroxy (Vit-D Deficiency, Fractures)  5. Hypercholesterolemia Check Labs - Lipid panel  6. Obesity, current BMI 35.5  Shayonna is currently in the  action stage of change. As such, her goal is to continue with weight loss efforts. She has agreed to keeping a food journal and adhering to recommended goals of 1200 calories and 80-90g protein.   Exercise goals: Older adults should follow the adult guidelines. When older adults cannot meet the adult guidelines, they should be as physically active as their abilities and conditions will allow.  Older adults should do exercises that maintain or improve balance if they are at risk of falling.  Older adults should  determine their level of effort for physical activity relative to their level of fitness.  Older adults with chronic conditions should understand whether and how their conditions affect their ability to do regular physical activity safely.  Behavioral modification strategies: increasing lean protein intake, decreasing simple carbohydrates, increasing vegetables, increasing water intake, no skipping meals, meal planning and cooking strategies, keeping healthy foods in the home, ways to avoid boredom eating, planning for success, and decreasing junk food.  Ashlea has agreed to follow-up with our clinic in 12 weeks. She was informed of the importance of frequent follow-up visits to maximize her success with intensive lifestyle modifications for her multiple health conditions.   Ataya was informed we would discuss her lab results at her next visit unless there is a critical issue that needs to be addressed sooner. Breelynn agreed to keep her next visit at the agreed upon time to discuss these results.  Objective:   Blood pressure 117/76, pulse 83, temperature 98 F (36.7 C), height 5' 2 (1.575 m), weight 194 lb (88 kg), SpO2 98%. Body mass index is 35.48 kg/m.  General: Cooperative, alert, well developed, in no acute distress. HEENT: Conjunctivae and lids unremarkable. Cardiovascular: Regular rhythm.  Lungs: Normal work of breathing. Neurologic: No focal deficits.   Lab Results  Component Value Date   CREATININE 0.81 03/29/2023   BUN 19 03/29/2023   NA 139 03/29/2023   K 4.1 03/29/2023   CL 102 03/29/2023   CO2 28 03/29/2023   Lab Results  Component Value Date   ALT 13 11/19/2022   AST 16 11/19/2022   ALKPHOS 62 11/19/2022   BILITOT 0.6 11/19/2022   Lab Results  Component Value Date   HGBA1C 6.0 03/29/2023   HGBA1C 5.9 11/19/2022   HGBA1C 5.9 (H) 08/18/2022   HGBA1C 5.8 (H) 03/24/2022   HGBA1C 6.2 11/13/2021   Lab Results  Component Value Date   INSULIN  19.7 11/10/2022    INSULIN  18.9 08/18/2022   INSULIN  20.5 03/24/2022   INSULIN  20.4 07/01/2021   INSULIN  26.6 (H) 01/28/2021   Lab Results  Component Value Date   TSH 2.590 01/30/2020   Lab Results  Component Value Date   CHOL 182 11/19/2022   HDL 62.70 11/19/2022   LDLCALC 76 11/19/2022   LDLDIRECT 96.0 11/13/2021   TRIG 216.0 (H) 11/19/2022   CHOLHDL 3 11/19/2022   Lab Results  Component Value Date   VD25OH 51.5 11/10/2022   VD25OH 53.4 08/18/2022   VD25OH 63.2 03/24/2022   Lab Results  Component Value Date   WBC 6.3 03/29/2023   HGB 15.3 (H) 03/29/2023   HCT 45.9 03/29/2023   MCV 91.1 03/29/2023   PLT 294.0 03/29/2023   No results found for: IRON, TIBC, FERRITIN  Attestation Statements:   Reviewed by clinician on day of visit: allergies, medications, problem list, medical history, surgical history, family history, social history, and previous encounter notes.  I have reviewed the above documentation for accuracy and completeness, and I  agree with the above. -  Justen Fonda d. Brice Kossman, NP-C

## 2023-09-08 LAB — COMPREHENSIVE METABOLIC PANEL WITH GFR
ALT: 13 IU/L (ref 0–32)
AST: 13 IU/L (ref 0–40)
Albumin: 4.8 g/dL (ref 3.8–4.8)
Alkaline Phosphatase: 80 IU/L (ref 44–121)
BUN/Creatinine Ratio: 22 (ref 12–28)
BUN: 18 mg/dL (ref 8–27)
Bilirubin Total: 0.4 mg/dL (ref 0.0–1.2)
CO2: 22 mmol/L (ref 20–29)
Calcium: 10.7 mg/dL — ABNORMAL HIGH (ref 8.7–10.3)
Chloride: 102 mmol/L (ref 96–106)
Creatinine, Ser: 0.82 mg/dL (ref 0.57–1.00)
Globulin, Total: 2.2 g/dL (ref 1.5–4.5)
Glucose: 86 mg/dL (ref 70–99)
Potassium: 4.5 mmol/L (ref 3.5–5.2)
Sodium: 138 mmol/L (ref 134–144)
Total Protein: 7 g/dL (ref 6.0–8.5)
eGFR: 74 mL/min/1.73 (ref >59)

## 2023-09-08 LAB — LIPID PANEL
Chol/HDL Ratio: 2.9 ratio (ref 0.0–4.4)
Cholesterol, Total: 183 mg/dL (ref 100–199)
HDL: 63 mg/dL (ref >39)
LDL Chol Calc (NIH): 86 mg/dL (ref 0–99)
Triglycerides: 206 mg/dL — ABNORMAL HIGH (ref 0–149)
VLDL Cholesterol Cal: 34 mg/dL (ref 5–40)

## 2023-09-08 LAB — INSULIN, RANDOM: INSULIN: 24.5 u[IU]/mL (ref 2.6–24.9)

## 2023-09-08 LAB — MAGNESIUM: Magnesium: 2.2 mg/dL (ref 1.6–2.3)

## 2023-09-08 LAB — VITAMIN B12: Vitamin B-12: 516 pg/mL (ref 232–1245)

## 2023-09-08 LAB — HEMOGLOBIN A1C
Est. average glucose Bld gHb Est-mCnc: 126 mg/dL
Hgb A1c MFr Bld: 6 % — ABNORMAL HIGH (ref 4.8–5.6)

## 2023-09-08 LAB — VITAMIN D 25 HYDROXY (VIT D DEFICIENCY, FRACTURES): Vit D, 25-Hydroxy: 44.3 ng/mL (ref 30.0–100.0)

## 2023-09-09 ENCOUNTER — Other Ambulatory Visit: Payer: Self-pay | Admitting: Family Medicine

## 2023-09-09 DIAGNOSIS — I10 Essential (primary) hypertension: Secondary | ICD-10-CM

## 2023-09-30 ENCOUNTER — Encounter: Payer: Self-pay | Admitting: Family Medicine

## 2023-09-30 ENCOUNTER — Ambulatory Visit: Admitting: Family Medicine

## 2023-09-30 VITALS — BP 128/70 | HR 88 | Temp 98.1°F | Resp 16 | Ht 62.0 in | Wt 195.6 lb

## 2023-09-30 DIAGNOSIS — E7800 Pure hypercholesterolemia, unspecified: Secondary | ICD-10-CM

## 2023-09-30 DIAGNOSIS — Z23 Encounter for immunization: Secondary | ICD-10-CM | POA: Diagnosis not present

## 2023-09-30 DIAGNOSIS — R7303 Prediabetes: Secondary | ICD-10-CM

## 2023-09-30 DIAGNOSIS — I10 Essential (primary) hypertension: Secondary | ICD-10-CM | POA: Diagnosis not present

## 2023-09-30 DIAGNOSIS — D582 Other hemoglobinopathies: Secondary | ICD-10-CM

## 2023-09-30 LAB — CBC
HCT: 45.1 % (ref 36.0–46.0)
Hemoglobin: 14.8 g/dL (ref 12.0–15.0)
MCHC: 32.9 g/dL (ref 30.0–36.0)
MCV: 88.5 fl (ref 78.0–100.0)
Platelets: 308 K/uL (ref 150.0–400.0)
RBC: 5.1 Mil/uL (ref 3.87–5.11)
RDW: 14 % (ref 11.5–15.5)
WBC: 5.6 K/uL (ref 4.0–10.5)

## 2023-09-30 MED ORDER — ATORVASTATIN CALCIUM 20 MG PO TABS: 20.0000 mg | ORAL_TABLET | Freq: Every day | ORAL | 2 refills | Status: AC

## 2023-09-30 MED ORDER — LISINOPRIL-HYDROCHLOROTHIAZIDE 10-12.5 MG PO TABS: 1.0000 | ORAL_TABLET | Freq: Every day | ORAL | 2 refills | Status: AC

## 2023-09-30 NOTE — Patient Instructions (Signed)
 Thank you for coming in today. No change in medications at this time. If there are any concerns on your bloodwork, I will let you know. Take care!

## 2023-09-30 NOTE — Progress Notes (Signed)
 Subjective:  Patient ID: Lori Mooney, female    DOB: October 14, 1945  Age: 78 y.o. MRN: [id number]  CC:  Chief Complaint  Patient presents with   Medical Management of Chronic Issues    Pt is doing okay, mammo is scheduled FYI, had some labs on 09/03/2023 hoping PCP can review this, pt is fasting     HPI Lori Mooney presents for   Recovering well - R shoulder total joint April 21st, Dr. Kay. Happy with progress. No pain or limitations.   Prediabetes: Treated with metformin  500 mg daily.  Followed by healthy weight and wellness, recent visit August 12 with Rockie Dalton, NP.  No GI side effects with meds.  Benefiber or miralax  for constipation.  Lab Results  Component Value Date   HGBA1C 6.0 (H) 09/07/2023   Wt Readings from Last 3 Encounters:  09/30/23 195 lb 9.6 oz (88.7 kg)  09/07/23 194 lb (88 kg)  04/06/23 192 lb (87.1 kg)   Hypertension: Lisinopril  HCTZ 10/12.5 mg daily.no new med side effects.  Home readings: under 120/70.  BP Readings from Last 3 Encounters:  09/30/23 128/70  09/07/23 117/76  04/06/23 112/73   Lab Results  Component Value Date   CREATININE 0.82 09/07/2023   Hyperlipidemia: Lipitor 20 mg daily.no new myalgias or side effects.  Lab Results  Component Value Date   CHOL 183 09/07/2023   HDL 63 09/07/2023   LDLCALC 86 09/07/2023   LDLDIRECT 96.0 11/13/2021   TRIG 206 (H) 09/07/2023   CHOLHDL 2.9 09/07/2023   Lab Results  Component Value Date   ALT 13 09/07/2023   AST 13 09/07/2023   ALKPHOS 80 09/07/2023   BILITOT 0.4 09/07/2023   Hypercalcemia: Single elevation on 8/12 labs at 10.7.   Elevated hemoglobin - 15.3 on 03/29/23. Has not rechecked.   Health maintenance: Mammogram has been scheduled. Flu vaccine today.  Defers covid booster at this point.   History Patient Active Problem List   Diagnosis Date Noted   Pain in joint of right shoulder 03/01/2023   Osteopenia 05/09/2021   Status post colonoscopy 09/26/2020    Insulin  resistance 02/28/2020   Other hyperlipidemia 02/14/2020   Essential hypertension 01/30/2020   Hypertriglyceridemia 01/30/2020   Prediabetes 01/30/2020   Osteoarthritis 01/30/2020   Vitamin D  deficiency 01/30/2020   Aftercare following explantation of knee joint prosthesis 09/01/2018   Presence of right artificial shoulder joint 09/01/2018   H/O total knee replacement, left 08/24/2018   Pain in right knee 10/09/2017   Osteoarthritis of spine with radiculopathy, lumbar region 06/25/2017   Back pain with sciatica 06/25/2017   Spinal stenosis of lumbar region without neurogenic claudication 06/25/2017   GERD (gastroesophageal reflux disease) 09/19/2013   Vertigo 03/02/2012   Hypercholesterolemia 10/08/2010   Benign hypertensive heart disease without heart failure 10/08/2010   Class 2 severe obesity with serious comorbidity and body mass index (BMI) of 37.0 to 37.9 in adult [facility]) 10/08/2010   Past Medical History:  Diagnosis Date   Allergy    Arthritis    history of arthritis in the fingers   Cataract    Constipation    DDD (degenerative disc disease), lumbar    Exogenous obesity    GERD (gastroesophageal reflux disease)    Heartburn    Hypercholesterolemia    Hyperlipidemia    hypercholesterolemia   Hypertension    Hypertriglyceridemia    Osteoarthritis    Palpitations    Prediabetes    SOB (shortness of breath) on exertion  Swelling of both lower extremities    Past Surgical History:  Procedure Laterality Date   CATARACT EXTRACTION W/ INTRAOCULAR LENS IMPLANT Left    DILITATION & CURRETTAGE/HYSTROSCOPY WITH VERSAPOINT RESECTION N/A 08/25/2012   Procedure: DILATATION & CURETTAGE/HYSTEROSCOPY WITH VERSAPOINT RESECTION;  Surgeon: Percilla Burly, MD;  Location: [facility];  Service: Gynecology;  Laterality: N/A;   EYE SURGERY     LUMBAR EPIDURAL INJECTION  08/04/2017   TONSILLECTOMY     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   Procedure: TOTAL KNEE ARTHROPLASTY;   Surgeon: Heide Ingle, MD;  Location: [facility];  Service: Orthopedics;  Laterality: Left;    TUBAL LIGATION     Allergies  Allergen Reactions   Shellfish Allergy Hives   Strawberry Extract Hives   Prior to Admission medications   Medication Sig Start Date End Date Taking? Authorizing Provider  acetaminophen  (TYLENOL ) 325 MG tablet Take 650 mg by mouth every 6 (six) hours as needed.   Yes [provider]  atorvastatin  (LIPITOR) 20 MG tablet Take 1 tablet by mouth once daily 08/19/23  Yes Levora Reyes SAUNDERS, MD  Biotin w/ Vitamins C & E (HAIR/SKIN/NAILS PO) Take by mouth. Natures Bounty   Yes [provider]  Cholecalciferol (VITAMIN D -3) 1000 UNITS CAPS Take 1,000 Units by mouth daily.   Yes [provider]  famotidine (PEPCID) 20 MG tablet 10 mg daily. 09/15/18  Yes [provider]  lisinopril -hydrochlorothiazide  (ZESTORETIC ) 10-12.5 MG tablet Take 1 tablet by mouth once daily 09/09/23  Yes Levora Reyes SAUNDERS, MD  loratadine (CLARITIN) 10 MG tablet Take 10 mg by mouth daily as needed for allergies.    Yes [provider]  magnesium 30 MG tablet Take 250 mg by mouth daily.    Yes [provider]  meloxicam  (MOBIC ) 7.5 MG tablet Take 1 tablet (7.5 mg total) by mouth daily. 09/17/22  Yes Danford, Rockie D, NP  metFORMIN  (GLUCOPHAGE ) 500 MG tablet 1 tab with breakfast 09/07/23  Yes Danford, Rockie BIRCH, NP   Social History   Socioeconomic History   Marital status: Married    Spouse name: Destiny Hagin   Number of children: 2   Years of education: Not on file   Highest education level: Bachelor's degree (e.g., BA, AB, BS)  Occupational History   Occupation: Retired Charity fundraiser  Tobacco Use   Smoking status: Never   Smokeless tobacco: Never  Vaping Use   Vaping status: Never Used  Substance and Sexual Activity   Alcohol use: Yes    Alcohol/week: 1.0 - 2.0 standard drink of alcohol    Types: 1 - 2 Standard drinks or equivalent per week   Drug use:  No   Sexual activity: Yes  Other Topics Concern   Not on file  Social History Narrative   Married. Education: [employer].   Social Drivers of Corporate investment banker Strain: Low Risk  (09/27/2023)   Overall Financial Resource Strain (CARDIA)    Difficulty of Paying Living Expenses: Not hard at all  Food Insecurity: No Food Insecurity (09/27/2023)   Hunger Vital Sign    Worried About Running Out of Food in the Last Year: Never true    Ran Out of Food in the Last Year: Never true  Transportation Needs: No Transportation Needs (09/27/2023)   PRAPARE - Administrator, Civil Service (Medical): No    Lack of Transportation (Non-Medical): No  Physical Activity: Insufficiently Active (09/27/2023)   Exercise Vital Sign    Days of  Exercise per Week: 2 days    Minutes of Exercise per Session: 50 min  Stress: No Stress Concern Present (09/27/2023)   [employer] of Occupational Health - Occupational Stress Questionnaire    Feeling of Stress: Not at all  Social Connections: Socially Integrated (09/27/2023)   Social Connection and Isolation Panel    Frequency of Communication with Friends and Family: More than three times a week    Frequency of Social Gatherings with Friends and Family: Twice a week    Attends Religious Services: More than 4 times per year    Active Member of [employer] or Organizations: Yes    Attends Engineer, structural: More than 4 times per year    Marital Status: Married  Catering manager Violence: Not At Risk (04/13/2023)   Humiliation, Afraid, Rape, and Kick questionnaire    Fear of Current or Ex-Partner: No    Emotionally Abused: No    Physically Abused: No    Sexually Abused: No    Review of Systems  Constitutional:  Negative for fatigue and unexpected weight change.  Respiratory:  Negative for chest tightness and shortness of breath.   Cardiovascular:  Negative for chest pain, palpitations and leg swelling.  Gastrointestinal:  Negative for  abdominal pain and blood in stool.  Neurological:  Negative for dizziness, syncope, light-headedness and headaches.     Objective:   Vitals:   09/30/23 0857  BP: 128/70  Pulse: 88  Resp: 16  Temp: 98.1 F (36.7 C)  TempSrc: Temporal  SpO2: 97%  Weight: 195 lb 9.6 oz (88.7 kg)  Height: 5' 2 (1.575 m)     Physical Exam Vitals reviewed.  Constitutional:      Appearance: Normal appearance. She is well-developed.  HENT:     Head: Normocephalic and atraumatic.  Eyes:     Conjunctiva/sclera: Conjunctivae normal.     Pupils: Pupils are equal, round, and reactive to light.  Neck:     Vascular: No carotid bruit.  Cardiovascular:     Rate and Rhythm: Normal rate and regular rhythm.     Heart sounds: Normal heart sounds.  Pulmonary:     Effort: Pulmonary effort is normal.     Breath sounds: Normal breath sounds.  Abdominal:     Palpations: Abdomen is soft. There is no pulsatile mass.     Tenderness: There is no abdominal tenderness.  Musculoskeletal:     Right lower leg: No edema.     Left lower leg: No edema.  Skin:    General: Skin is warm and dry.  Neurological:     Mental Status: She is alert and oriented to person, place, and time.  Psychiatric:        Mood and Affect: Mood normal.        Behavior: Behavior normal.        Assessment & Plan:  Lori Mooney is a 78 y.o. female . Essential hypertension  - Stable, tolerating current regimen.  Continue lisinopril  HCTZ.  Recent labs noted.  Prediabetes  - Tolerating metformin , continue same and follow-up with healthy weight and wellness.  Recent labs noted.  Hypercholesterolemia  - Tolerating statin, slight hypertriglyceridemia, noted previously, may be in part due to hyperglycemia/prediabetes.  Continue to monitor, no med changes at this time.  Anticipate improvement as weight improves.  Hypercalcemia - Plan: PTH, Intact and Calcium   - Mild elevation, check PTH with calcium .  Needs flu shot - Plan: Flu  vaccine HIGH DOSE PF(Fluzone Trivalent)  Elevated hemoglobin (HCC) - Plan: CBC  - Noted on prior labs, repeat today.  If elevated consider repeat after increased hydration to see if this may be a volume depletion component.  Mild elevation previously.  No orders of the defined types were placed in this encounter.  Patient Instructions  Thank you for coming in today. No change in medications at this time. If there are any concerns on your bloodwork, I will let you know. Take care!     Signed,   Reyes Pines, MD [facility], [facility] 09/30/23 9:28 AM

## 2023-10-01 ENCOUNTER — Ambulatory Visit: Payer: Self-pay | Admitting: Family Medicine

## 2023-10-02 LAB — PTH, INTACT AND CALCIUM
Calcium: 10.1 mg/dL (ref 8.6–10.4)
PTH: 31 pg/mL (ref 16–77)

## 2023-10-21 ENCOUNTER — Ambulatory Visit
Admission: RE | Admit: 2023-10-21 | Discharge: 2023-10-21 | Disposition: A | Discharge status: Home / Self Care | Source: Ambulatory Visit | Attending: Family Medicine | Admitting: Family Medicine

## 2023-10-21 DIAGNOSIS — R928 Other abnormal and inconclusive findings on diagnostic imaging of breast: Secondary | ICD-10-CM | POA: Diagnosis not present

## 2023-10-21 DIAGNOSIS — R921 Mammographic calcification found on diagnostic imaging of breast: Secondary | ICD-10-CM

## 2023-10-28 DIAGNOSIS — Z96652 Presence of left artificial knee joint: Secondary | ICD-10-CM | POA: Diagnosis not present

## 2023-10-28 DIAGNOSIS — M25562 Pain in left knee: Secondary | ICD-10-CM | POA: Diagnosis not present

## 2023-10-28 DIAGNOSIS — T8482XA Fibrosis due to internal orthopedic prosthetic devices, implants and grafts, initial encounter: Secondary | ICD-10-CM | POA: Diagnosis not present

## 2023-12-08 ENCOUNTER — Ambulatory Visit (INDEPENDENT_AMBULATORY_CARE_PROVIDER_SITE_OTHER): Payer: Self-pay | Admitting: Adult Health

## 2023-12-08 VITALS — BP 107/73 | HR 88 | Temp 98.3°F | Ht 62.0 in | Wt 194.0 lb

## 2023-12-08 DIAGNOSIS — E7800 Pure hypercholesterolemia, unspecified: Secondary | ICD-10-CM | POA: Diagnosis not present

## 2023-12-08 DIAGNOSIS — R7303 Prediabetes: Secondary | ICD-10-CM | POA: Diagnosis not present

## 2023-12-08 DIAGNOSIS — E669 Obesity, unspecified: Secondary | ICD-10-CM

## 2023-12-08 DIAGNOSIS — E66812 Obesity, class 2: Secondary | ICD-10-CM

## 2023-12-08 DIAGNOSIS — Z0000 Encounter for general adult medical examination without abnormal findings: Secondary | ICD-10-CM

## 2023-12-08 DIAGNOSIS — Z6835 Body mass index (BMI) 35.0-35.9, adult: Secondary | ICD-10-CM

## 2023-12-08 DIAGNOSIS — E559 Vitamin D deficiency, unspecified: Secondary | ICD-10-CM | POA: Diagnosis not present

## 2023-12-08 MED ORDER — METFORMIN HCL 500 MG PO TABS: ORAL_TABLET | ORAL | 1 refills | Status: AC

## 2023-12-08 NOTE — Progress Notes (Signed)
 WEIGHT SUMMARY AND BIOMETRICS  Vitals Temp: 98.3 F (36.8 C) BP: 107/73 Pulse Rate: 88 SpO2: 96 %   Anthropometric Measurements Height: 5' 2 (1.575 m) Weight: 194 lb (88 kg) BMI (Calculated): 35.47 Weight at Last Visit: 194 lb Weight Lost Since Last Visit: 0 Weight Gained Since Last Visit: 0 Starting Weight: 203 lb Total Weight Loss (lbs): 9 lb (4.082 kg)   Body Composition  Body Fat %: 48.7 % Fat Mass (lbs): 94.4 lbs Muscle Mass (lbs): 94.6 lbs Total Body Water (lbs): 75 lbs Visceral Fat Rating : 16   Other Clinical Data Fasting: yes Labs: no Today's Visit #: 34 Starting Date: 01/30/20    Chief Complaint:   OBESITY Lori Mooney is here to discuss her progress with her obesity treatment plan.  She is on the keeping a food journal and adhering to recommended goals of 1200 calories and 80-90g protein and states she is following her eating plan approximately 75 % of the time.  She states she is exercising Low Impact Aerobics 45 minutes 2 times per week.  Interim History:  Reviewed [employer] Results with pt: Muscle Mass: +0.2 lb Adipose Mass: -0.4 lbs  She reports tracking intake consistently and will consume between 1100-1400 cal with 60-80g protein per day  She has increased regular exercie to twice weekly- Low Impact Aerobics  She is followed by [facility] She has chronic f/u with established PCP early Spet 2025- reviewed notes and lab results.  Of note- Her father recently celebrated his 41 birthday! He believes he will live until age 40.  Subjective:   1. Hypercholesterolemia Discussed Labs Lipid Panel     Component Value Date/Time   CHOL 183 09/07/2023 0934   TRIG 206 (H) 09/07/2023 0934   HDL 63 09/07/2023 0934   CHOLHDL 2.9 09/07/2023 0934   CHOLHDL 3 11/19/2022 0833   VLDL 43.2 (H) 11/19/2022 0833   LDLCALC 86 09/07/2023 0934   LDLDIRECT 96.0 11/13/2021 0843   LABVLDL 34 09/07/2023 0934    Tgs remain above goal, otherwise lipid  panel stable PCP managed daily statin- Lipitor 20mg   2. Prediabetes Discussed Labs  Latest Reference Range & Units 09/07/23 09:34  Glucose 70 - 99 mg/dL 86  Hemoglobin J8R 4.8 - 5.6 % 6.0 (H)  Est. average glucose Bld gHb Est-mCnc mg/dL 873  INSULIN  2.6 - 24.9 uIU/mL 24.5  (H): Data is abnormally high  Latest Reference Range & Units 09/07/23 09:34  eGFR >59 mL/min/1.73 74   CBG at goal A1c unchanged and still in pre-diabetic range Insulin  Resistance worsened She is on Metformin  500mg - tolerating well  3. Vitamin D  deficiency Discussed Labs  Latest Reference Range & Units 09/07/23 09:34  Vitamin D , 25-Hydroxy 30.0 - 100.0 ng/mL 44.3   Vit D Level stable She is on daily OTC Vit D3 1000  4. Hypercalcemia Discussed Labs  Latest Reference Range & Units 09/07/23 09:34 09/30/23 09:39  Calcium  8.6 - 10.4 mg/dL 89.2 (H) 89.8  (H): Data is abnormally high  Latest Reference Range & Units 09/30/23 09:39  PTH, Intact 16 - 77 pg/mL 31   Ca++ level elevated at August check, normalized at repeat PTH level is normal  5. Healthcare maintenance Discussed Labs  Latest Reference Range & Units 09/07/23 09:34  Vitamin B12 232 - 1,245 pg/mL 516   B12 level stable and at goal  Assessment/Plan:   1. Hypercholesterolemia Continue healthy eating and increase regular exercise to at least 3 times weekly  2. Prediabetes (Primary)  Refill []   metFORMIN  (GLUCOPHAGE ) 500 MG tablet 1 tab with breakfast Dispense: 90 tablet, Refills: 1 ordered   Dec 2025- Call current pharmacy and TRANSFER Rx's to new pharmacy- pt can accomplish this  3. Vitamin D  deficiency Monitor Labs  4. Hypercalcemia Monitor Labs  5. Healthcare maintenance Continue healthy eating and increase regular exercise to at least 3 times weekly  6. Obesity, current BMI 35.5  Lori Mooney is currently in the action stage of change. As such, her goal is to continue with weight loss efforts. She has agreed to keeping a food journal  and adhering to recommended goals of 1200 calories and 60-80g protein.   Exercise goals: Older adults should follow the adult guidelines. When older adults cannot meet the adult guidelines, they should be as physically active as their abilities and conditions will allow.  Older adults should do exercises that maintain or improve balance if they are at risk of falling.  Older adults should determine their level of effort for physical activity relative to their level of fitness.  Older adults with chronic conditions should understand whether and how their conditions affect their ability to do regular physical activity safely.  Behavioral modification strategies: increasing lean protein intake, decreasing simple carbohydrates, increasing vegetables, increasing water intake, no skipping meals, meal planning and cooking strategies, keeping healthy foods in the home, ways to avoid boredom eating, and planning for success.  Lori Mooney has agreed to follow-up with our clinic in 12 weeks. She was informed of the importance of frequent follow-up visits to maximize her success with intensive lifestyle modifications for her multiple health conditions.   Consider GLP-1 therapy in [id number]- pt to contact her insurance to inquire about Rx coverage for this class of medication  Objective:   Blood pressure 107/73, pulse 88, temperature 98.3 F (36.8 C), height 5' 2 (1.575 m), weight 194 lb (88 kg), SpO2 96%. Body mass index is 35.48 kg/m.  General: Cooperative, alert, well developed, in no acute distress. HEENT: Conjunctivae and lids unremarkable. Cardiovascular: Regular rhythm.  Lungs: Normal work of breathing. Neurologic: No focal deficits.   Lab Results  Component Value Date   CREATININE 0.82 09/07/2023   BUN 18 09/07/2023   NA 138 09/07/2023   K 4.5 09/07/2023   CL 102 09/07/2023   CO2 22 09/07/2023   Lab Results  Component Value Date   ALT 13 09/07/2023   AST 13 09/07/2023   ALKPHOS 80 09/07/2023    BILITOT 0.4 09/07/2023   Lab Results  Component Value Date   HGBA1C 6.0 (H) 09/07/2023   HGBA1C 6.0 03/29/2023   HGBA1C 5.9 11/19/2022   HGBA1C 5.9 (H) 08/18/2022   HGBA1C 5.8 (H) 03/24/2022   Lab Results  Component Value Date   INSULIN  24.5 09/07/2023   INSULIN  19.7 11/10/2022   INSULIN  18.9 08/18/2022   INSULIN  20.5 03/24/2022   INSULIN  20.4 07/01/2021   Lab Results  Component Value Date   TSH 2.590 01/30/2020   Lab Results  Component Value Date   CHOL 183 09/07/2023   HDL 63 09/07/2023   LDLCALC 86 09/07/2023   LDLDIRECT 96.0 11/13/2021   TRIG 206 (H) 09/07/2023   CHOLHDL 2.9 09/07/2023   Lab Results  Component Value Date   VD25OH 44.3 09/07/2023   VD25OH 51.5 11/10/2022   VD25OH 53.4 08/18/2022   Lab Results  Component Value Date   WBC 5.6 09/30/2023   HGB 14.8 09/30/2023   HCT 45.1 09/30/2023   MCV 88.5 09/30/2023   PLT 308.0  09/30/2023   No results found for: IRON, TIBC, FERRITIN  Attestation Statements:   Reviewed by clinician on day of visit: allergies, medications, problem list, medical history, surgical history, family history, social history, and previous encounter notes.  I have reviewed the above documentation for accuracy and completeness, and I agree with the above. -  Mansoor Hillyard d. Jerren Flinchbaugh, NP-C

## 2024-03-09 ENCOUNTER — Ambulatory Visit (INDEPENDENT_AMBULATORY_CARE_PROVIDER_SITE_OTHER): Admitting: Adult Health

## 2024-03-30 ENCOUNTER — Ambulatory Visit: Admitting: Family Medicine

## 2024-04-18 ENCOUNTER — Encounter
